# Patient Record
Sex: Male | Born: 1945 | Race: White | Hispanic: No | Marital: Married | Smoking: Current every day smoker
Health system: Southern US, Community
[De-identification: ages and names within clinical notes are randomized; demographics above are authoritative.]

## PROBLEM LIST (undated history)

## (undated) DIAGNOSIS — E119 Type 2 diabetes mellitus without complications: Secondary | ICD-10-CM

## (undated) DIAGNOSIS — I10 Essential (primary) hypertension: Secondary | ICD-10-CM

## (undated) DIAGNOSIS — C801 Malignant (primary) neoplasm, unspecified: Secondary | ICD-10-CM

## (undated) HISTORY — DX: Type 2 diabetes mellitus without complications: E11.9

## (undated) HISTORY — DX: Essential (primary) hypertension: I10

## (undated) HISTORY — DX: Malignant (primary) neoplasm, unspecified: C80.1

---

## 2004-11-25 ENCOUNTER — Inpatient Hospital Stay (HOSPITAL_COMMUNITY): Admission: RE | Admit: 2004-11-25 | Discharge: 2004-11-28 | Payer: Self-pay | Admitting: Urology

## 2004-11-25 ENCOUNTER — Encounter (INDEPENDENT_AMBULATORY_CARE_PROVIDER_SITE_OTHER): Payer: Self-pay | Admitting: Specialist

## 2005-09-30 ENCOUNTER — Encounter (HOSPITAL_COMMUNITY): Admission: RE | Admit: 2005-09-30 | Discharge: 2005-12-07 | Payer: Self-pay | Admitting: Urology

## 2005-10-25 ENCOUNTER — Ambulatory Visit: Admission: RE | Admit: 2005-10-25 | Discharge: 2006-01-16 | Payer: Self-pay | Admitting: Radiation Oncology

## 2008-02-02 ENCOUNTER — Ambulatory Visit: Payer: Self-pay | Admitting: Oncology

## 2008-02-07 ENCOUNTER — Ambulatory Visit (HOSPITAL_COMMUNITY): Admission: RE | Admit: 2008-02-07 | Discharge: 2008-02-07 | Payer: Self-pay | Admitting: Urology

## 2008-02-14 LAB — COMPREHENSIVE METABOLIC PANEL
ALT: 17 U/L (ref 0–53)
Alkaline Phosphatase: 45 U/L (ref 39–117)
CO2: 25 mEq/L (ref 19–32)
Potassium: 4 mEq/L (ref 3.5–5.3)
Sodium: 136 mEq/L (ref 135–145)
Total Bilirubin: 0.5 mg/dL (ref 0.3–1.2)
Total Protein: 6.8 g/dL (ref 6.0–8.3)

## 2008-02-14 LAB — CBC WITH DIFFERENTIAL/PLATELET
BASO%: 0.3 % (ref 0.0–2.0)
MCHC: 35.4 g/dL (ref 32.0–35.9)
MONO#: 0.6 10*3/uL (ref 0.1–0.9)
NEUT#: 4.6 10*3/uL (ref 1.5–6.5)
RBC: 4.15 10*6/uL — ABNORMAL LOW (ref 4.20–5.71)
WBC: 7.3 10*3/uL (ref 4.0–10.0)
lymph#: 2 10*3/uL (ref 0.9–3.3)

## 2011-01-15 NOTE — Discharge Summary (Signed)
NAMEKRISHANG, READING NO.:  [id number]   MEDICAL RECORD NO.:  [id number]          PATIENT TYPE:  INP   LOCATION:  0349                         FACILITY:  [facility]   PHYSICIAN:  Rozanna Boer., M.D.DATE OF BIRTH:  Apr 11, 1946   DATE OF ADMISSION:  11/25/2004  DATE OF DISCHARGE:  11/28/2004                                 DISCHARGE SUMMARY   DISCHARGE DIAGNOSES:  1.  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  2.  Diabetes mellitus type 2 under good control.  3.  Erectile dysfunction.  4.  Elevated PSA.   OPERATION:  Radical retropubic prostatectomy and bilateral pelvic lymph node  dissection on November 25, 2004.   BRIEF HISTORY:  This 65 year old patient is admitted with a clinical T2B  Gleason 4+3 adenocarcinoma of the prostate. His PSA has slowly elevated over  the years. It was 1.01 August 2003, 3.03 June 2004, 3.66 December 2005,  and biopsy in January showed 70% Gleason 4+3 cancer on the right and none on  the left. The patient enters after auto donating 2 units of blood  understanding the risks including but not limited to incontinence,  impotence, deep venous thrombosis, pulmonary emboli, bleeding and death.   MEDICATIONS ON ADMISSION:  Amaryl, hydrochlorothiazide, Pravachol, Altace,  Viagra and metformin.   The only operation before was a circumcision in 1995.   HOSPITAL COURSE:  He was taken to the operating room on the day of admission  after a satisfactory preop evaluation. His hematocrit preoperatively was 35%  and his creatinine was normal at 1.0. At surgery, he had rather extensive  adhesions particularly on the right side posteriorly near the rectum but had  very little blood loss during the case. He did not get his auto donated  blood but the pathology did show that the carcinoma involved the right lobe  and was a Gleason 4+4 with rather extensive involvement of the right lobe  and carcinoma involving the right apex margin of  the prostatectomy. There  was no capsular extension, the nodes were negative, seminal vesicles were  free of tumor. About 20% of the prostate tissue histologically was involved  by cancer. Postoperatively, his hematocrit dropped to 23% but again he  remained stable and did not receive his auto donated blood. His creatinine  was also normal at 1.2. He did well. His drainage slowly decreased and by  the time he was ready to go home on the third postoperative day, the JP  drain was removed and he was on a regular diet and sent home with his Foley  catheter to return to the office in a week for wound staple removal. Sent  home in improved ambulatory condition on a regular diet.      HMK/MEDQ  D:  12/24/2004  T:  12/24/2004  Job:  [id number]

## 2011-01-15 NOTE — H&P (Signed)
Richard [name], Richard [name] NO.:  [id number]   MEDICAL RECORD NO.:  [id number]          PATIENT TYPE:  INP   LOCATION:  0004                         FACILITY:  [facility]   PHYSICIAN:  Richard [name]., M.D.DATE OF BIRTH:  1946/05/15   DATE OF ADMISSION:  11/25/2004  DATE OF DISCHARGE:                                HISTORY & PHYSICAL   HISTORY OF PRESENT ILLNESS:  This 65 year old patient with a clinical T2B,  Gleason 4+3, adenocarcinoma of the prostate enters for radical surgery.  He  had a rise in his PSA.  It was 1.2 in December 2004, went to 3.5 in October  2005, and 3.66 in December 2005.  Biopsy in January showed 70% on the right  side, was Gleason 4+3.  There was none on the left.  After thorough  counseling, the patient chose surgery understanding the risks that were  including but not limited to incontinence, impotence, deep venous  thrombosis, pulmonary emboli, bleeding, and death.  He already has some  impotence for which he uses Viagra.  He already donated two units of blood  and underwent a mechanical bowel prep yesterday.   MEDICATIONS:  1.  Amaryl 25 mg daily.  2.  Hydrochlorothiazide 12.5 mg daily.  3.  Pravachol 40 mg daily.  4.  Altace 5 mg daily.  5.  Metformin 1000 mg daily.  6.  Viagra 50 mg about 50% of the time.   ALLERGIES:  No known drug allergies.   PAST SURGICAL HISTORY:  His only operation was a circumcision in 1995.   REVIEW OF SYSTEMS:  He has had no hematuria, no urinary tract infection, no  irritable bowel, melena.  No peptic ulcer disease.  No cardiac or pulmonary  symptomatology.  Nonsmoker.  No history of kidney stones.   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  His father died  of lung cancer at age 71.  No other family history of prostate cancer,  diabetes, or heart disease.   PHYSICAL EXAMINATION:  VITAL SIGNS:  Weight  207, blood pressure 135/71,  pulse 67, temperature 98, respirations 18.  GENERAL:  He is a healthy white male in no acute distress.  Full gray beard.  HEENT:  Clear.  Oropharynx is benign.  No inguinal, cervical, or axillary  adenopathy.  CHEST:  Clear.  No cardiac murmurs.  ABDOMEN:  Soft, liver and spleen nonpalpable.  Bladder is not distended.  GENITOURINARY:  Circumcised normal penis.  Normal scrotum.  Epididymis and  testes normal size, nontender.  Prostate about 30 g in size, not fixed or  indurated.  SV not palpated.  Slightly more asymmetric on the left side.  EXTREMITIES:  No edema.  Good distal pulses.  Intact sensation to light  touch.   IMPRESSION:  1.  T2B, Gleason 4+3 adenocarcinoma of the prostate.  2.  Elevated prostate-specific antigen.  3.  ED.  4.  Type 2 diabetes.   RECOMMENDATIONS:  Radical retropubic prostatectomy and bilateral pelvic  lymph  node dissection as discussed.      HMK/MEDQ  D:  11/25/2004  T:  11/25/2004  Job:  [id number]   cc:   Richard [name], M.D.  [address], [address]  [place]  [place] [address]  Fax: [phone]

## 2011-01-15 NOTE — Op Note (Signed)
NAMETRACKER, MANCE NO.:  [id number]   MEDICAL RECORD NO.:  [id number]          PATIENT TYPE:  INP   LOCATION:  0349                         FACILITY:  [facility]   PHYSICIAN:  Courtney Paris, M.D.DATE OF BIRTH:  Jan 10, 1946   DATE OF PROCEDURE:  11/25/2004  DATE OF DISCHARGE:  11/28/2004                                 OPERATIVE REPORT   PREOPERATIVE DIAGNOSIS:  Prostate cancer.   POSTOPERATIVE DIAGNOSIS:  Prostate cancer.   PROCEDURE PERFORMED:  Unilateral nerve sparing radical retropubic  prostatectomy with bilateral pelvic lymph node dissection.   ATTENDING SURGEON:  Courtney Paris, M.D.   RESIDENT SURGEON:  Thyra Breed, M.D.   ANESTHESIA:  General endotracheal.   ESTIMATED BLOOD LOSS:  600 cc.   DRAINS:  A 22-French Foley catheter to straight drain and a 10-French Blake  drain to bulb suction.   COMPLICATIONS:  None.   INDICATION FOR PROCEDURE:  Richard [name] is a pleasant 65 year old male  initially evaluated for an elevation of his PSA in 1 year time from 1.2 to  3.66 in December 2005.  A transrectal ultrasound-guided prostate biopsy  performed in January 2006 showed 70% Gleason 4+3=7 adenocarcinoma of the  prostate.  Several alternatives of therapy were discussed with Richard [name]  including watchful waiting, surgical extirpation, high-dose radiation,  brachytherapy, cryotherapy, and hormonal ablation.  Richard [name] has decided  to proceed with a radical retropubic prostatectomy.  He understands all the  risks, benefits, and alternatives of the procedure in detail including, but  not limited to, bleeding, infection, damage to adjacent structures, failure  of the procedure and need for future re-operation, erectile dysfunction,  incontinence, and risks of anesthesia.  Informed consent is obtained.   PROCEDURE IN DETAIL:  Following identification by his arm bracelet, the  patient was brought to the operating room and placed in the supine  position.  A small bump was placed in the patient's lumbar region.  He then underwent  successful induction of general endotracheal anesthesia and was given  preoperative IV antibiotics.  His lower abdomen was then shaved.  His lower  abdomen and genitalia were prepped with Betadine and draped in the usual  sterile fashion.  A 20-French Foley catheter was then inserted into the  bladder and the bladder was drained.   A lower midline incision was then created with a scalpel from the pubis to  just below the umbilicus.  Bovie electrocautery was then used to carry the  incision through the subcutaneous tissue to the level of the anterior rectus  fascia.  Any subcutaneous bleeding was controlled using Bovie  electrocautery.  The fascia was then incised in the midline, isolated.  The  rectus muscle was parted bluntly in the midline exposing the transversalis  fascia.  This was then opened and blunt dissection used to expose both the  right and the left pelvic fossa.  The right iliac fossa was then exposed  with malleable retractors using the Bookwalter self-retaining retractor and  we proceeded with dissection of the right-sided lymph node packet.  This was  initiated over the right iliac vein.  The node packet was rather small and  there was no evidence of gross nodal disease.  The lymph node dissection on  the right was then completed within the limits of dissection including the  external iliac vein, the obturator nerve, the circumflex iliac vein, and the  bifurcation of the iliac artery.  Hem-o-lok clips were then used to control  any vascular and lymphatic channels.  The obturator was kept in plain view  throughout the dissection.  Hemostasis was excellent.  We then reset our  retractor blades and performed an identical procedure on the left-sided  pelvic lymph nodes, again using large Weck Hem-o-lok clips for control of  lymphatic and vascular channels.  There was no evidence of gross  nodal  disease on the left.   We then readjusted our retractors to expose the endopelvic fascia  bilaterally.  This was punctured using Metzenbaum scissors.  A right angle  was then used to elevate the endopelvic fascia from the lateral aspect of  the prostate.  Metzenbaums were used to incise the fascia, thereby dropping  the neurovascular bundles posterolaterally.  The fat overlying the dorsal  venous complex was then teased to narrow the area.  Bovie electrocautery was  used to fulgurate these superficial vessels.  This then allowed excellent  exposure of the puboprostatic ligaments using a Kitner.  Metzenbaum scissors  were then used to gently take down the puboprostatic ligaments at their  extreme lateral attachments to the pubis.  Those in the midline were left  intact.  Surgeon's index finger and thumb were then used to create a groove  just above the urethra but posterior to the dorsal venous complex.  The  McDougal clamp was then passed beneath the dorsal venous complex.  A #1  Vicryl tie was then passed and tied around the dorsal venous complex.  The  McDougal clamp was replaced and a second #1 Vicryl tie was passed to doubly  ligate the complex.  Finally, a 2-0 Vicryl with a needle left intact was  passed, tied, and the dorsal venous complex was suture ligated.  A  Hohenfellner clamp was then placed beneath the dorsal venous complex.  Prior  to this, we did place a 2-0 Vicryl suture on a UR-5 needle in a figure-of-  eight fashion as a back-bleeding stitch.  The Bovie was then used to divide  the dorsal venous complex exposing the apex of the prostate and the urethra.  There was a small area of nodularity once we began developing our plane  between the rectum and prostate at the right apex.  However, the capsule of  the prostate appeared to be intact.  Therefore, we would come back and excise this questionable nodule at the apex after removing the prostate.  Metzenbaum scissors  were then used to carefully dissect the neurovascular  bundles from the urethra laterally on each side.  A right angle was placed  beneath the urethra and a moistened umbilical tape passed to hold the  orientation.  The anterior urethra was then incised using a long handled  knife.  The Foley catheter was then lubricated, pulled through the urethral  defect, the catheter was cut, and the catheter was brought into the wound  and used to provide cephalad traction on the prostate during further  dissection.  The posterior urethra was then divided and all remaining  rectourethralis attachments were taken down bluntly.  The prostate was then  carefully dissected from the rectum bluntly. The right side  of the prostate  was somewhat more difficult to elevate.  Therefore, a right-angle clamp was  placed beneath the right apex of the prostate through our plane we had  created on the left lateral aspect.  This then allowed excellent exposure of  the lateral pedicles.  These were taken down in a successive fashion using  right-angle dissection and large right-angle Hem-o-lok clips.  Once the  prostate had been sufficiently elevated, the anterior leaf of Denonvilliers  fascia was incised over the seminal vesicles.  This allowed easy delivery of  the ampulla of the vas which were dissected out using a right-angle clamp  and ligated using large Hem-o-lok clips.  Metzenbaum scissors were then used  to carefully dissect out the seminal vesicles.  The seminal vesicles were  ligated at their tips after placing a large right-angle Hem-o-lok clip.   We then turned our attention anteriorly where the bladder neck was grasped  between two Allis clamps.  Using Bovie electrocautery and a tonsil clamp,  the bladder neck fibers were carefully dissected out.  A Kitner was then  used until the longitudinal fibers of the urethra were visualized.  We  continued dissecting with our Kitner until the urethra was isolated.   A  right angle clamp was placed beneath the urethra and a moistened umbilical  tape again placed.  The anterior portion of the urethra was divided, the  catheter balloon was deflated, and the catheter removed from the bladder and  left on the prostate.  The remainder of the urethra was then incised using  Metzenbaum dissection.  Using the Bovie and our Metzenbaum scissors, we  carefully continued our dissection of the bladder from the prostate until a  plane was developed between the bladder and the prostate.  The Bovie was  used to remain the intervening tissue.  The bladder neck was widened by a  small hole just beneath our bladder neck.  We would close this in a tennis  racket fashion.  We removed any remaining attachments of the prostate and  the prostatic specimen was transferred for further pathologic analysis.  We  then reconstructed the bladder neck in a tennis racket fashion using a 2-0 chromic suture.  The bladder neck mucosa was everted using several  interrupted 4-0 chromic sutures.  The residual bladder neck was large enough  to admit the tip of the fifth finger of the surgeon.  The wound was then  copiously irrigated and any further bleeding was controlled using Bovie  electrocautery and small Hem-o-lok clips.   At this point, we turned our attention to the nodule of tissue near the  right apex.  We then widely dissected out the neurovascular bundle on this  side and carefully continued our dissection of the nodule off of the  interior mucosa of the rectum.  Extreme care was taken to avoid damage to  the rectum.  The area was carefully visualized and it appeared we were able  to take all of the nodule with no damage to the rectum.  This was sent as a  separate specimen as a right apical margin.  At this point, we placed the  [facility] device into the urethra.  We placed our anastomotic sutures in the  urethral stump with 2-0 Vicryl sutures at the 2, 5, 7, 10, and 12 o'clock   locations.  The Morrison device was removed and a fresh 22-French Foley  catheter was inserted and guided into the bladder.  A Babcock clamp  was used  on the Foley catheter within the bladder to protect the balloon during  placement of the anastomotic sutures.  We then placed our anastomotic  sutures in the bladder in their corresponding locations.  The wound was  again copiously irrigated and there was no further evidence of active  bleeding.  The bladder neck was then securely positioned against the  urethral stump removing all slack from the anastomotic sutures.  The sutures  were then tied and trimmed.  The bladder was irrigated and the anastomosis  was found to be water tight.  The urine was pink.  A separate stab incision  was then made to the left lower aspect of the midline incision where a #10  flat Blake drain was placed and draped in the area of the anastomosis and  right and left pelvic fossa.  The fascia was then closed with a running #1  PDS.  Subcutaneous tissues were irrigated with sterile saline and the skin  was closed using surgical stainless clips.  Foley catheter was again  irrigated and there was no bleeding noted.  The catheter was placed to  straight drainage and the J-P to bulb suction.  All sponge, needle, and  instrument counts were correct x2.  Please note that Dr. Vic Blackbird  was present and participated in the entire procedure as he was the  responsible surgeon.  The patient tolerated the procedure well and there  were no complications.   DISPOSITION:  After awaking from general anesthesia, the patient was  transported to the postanesthesia care unit in stable condition.  From here,  he will be transferred to the floor for further postoperative management.      EG/MEDQ  D:  11/30/2004  T:  11/30/2004  Job:  [id number]

## 2011-08-23 ENCOUNTER — Telehealth: Payer: Self-pay | Admitting: Oncology

## 2011-08-23 NOTE — Telephone Encounter (Signed)
S/w wife today re appt and per wife pt does not want to schedule right now. Per wife it was their understanding that this would be done later down the road if the need arises. But as long as the pt's psa is not rising they don't see the need. Per wife should they decide to schedule they will contact [facility]. Letter sent to [facility].

## 2011-08-30 ENCOUNTER — Telehealth: Payer: Self-pay | Admitting: Oncology

## 2011-08-30 NOTE — Telephone Encounter (Signed)
Pt lmonvm for stating he wishes to go ahead w/shceduling appt w/FS. Returned pt's call and asked that he call me back ASAP re appt w/FS.

## 2011-08-30 NOTE — Telephone Encounter (Signed)
Pt returned my call and was given an appt for 1/9 @ 1:30 pm w/FS.

## 2011-09-07 ENCOUNTER — Other Ambulatory Visit: Payer: Self-pay | Admitting: Oncology

## 2011-09-07 DIAGNOSIS — C61 Malignant neoplasm of prostate: Secondary | ICD-10-CM

## 2011-09-08 ENCOUNTER — Other Ambulatory Visit (HOSPITAL_BASED_OUTPATIENT_CLINIC_OR_DEPARTMENT_OTHER): Payer: BC Managed Care – PPO

## 2011-09-08 ENCOUNTER — Ambulatory Visit: Payer: Self-pay

## 2011-09-08 ENCOUNTER — Telehealth: Payer: Self-pay | Admitting: Oncology

## 2011-09-08 ENCOUNTER — Ambulatory Visit (HOSPITAL_BASED_OUTPATIENT_CLINIC_OR_DEPARTMENT_OTHER): Payer: BC Managed Care – PPO | Admitting: Oncology

## 2011-09-08 VITALS — BP 142/81 | HR 69 | Temp 97.0°F | Ht 70.0 in | Wt 226.4 lb

## 2011-09-08 DIAGNOSIS — C61 Malignant neoplasm of prostate: Secondary | ICD-10-CM

## 2011-09-08 LAB — CBC WITH DIFFERENTIAL/PLATELET
Basophils Absolute: 0 10*3/uL (ref 0.0–0.1)
Eosinophils Absolute: 0.1 10*3/uL (ref 0.0–0.5)
HCT: 38.2 % — ABNORMAL LOW (ref 38.4–49.9)
LYMPH%: 30.8 % (ref 14.0–49.0)
MCV: 94.6 fL (ref 79.3–98.0)
MONO#: 0.6 10*3/uL (ref 0.1–0.9)
MONO%: 7.7 % (ref 0.0–14.0)
NEUT#: 4.7 10*3/uL (ref 1.5–6.5)
NEUT%: 60 % (ref 39.0–75.0)
Platelets: 217 10*3/uL (ref 140–400)
WBC: 7.8 10*3/uL (ref 4.0–10.3)

## 2011-09-08 LAB — COMPREHENSIVE METABOLIC PANEL
AST: 17 U/L (ref 0–37)
Albumin: 4.6 g/dL (ref 3.5–5.2)
Alkaline Phosphatase: 50 U/L (ref 39–117)
BUN: 14 mg/dL (ref 6–23)
Calcium: 9.7 mg/dL (ref 8.4–10.5)
Chloride: 105 mEq/L (ref 96–112)
Glucose, Bld: 63 mg/dL — ABNORMAL LOW (ref 70–99)
Potassium: 4.5 mEq/L (ref 3.5–5.3)
Sodium: 140 mEq/L (ref 135–145)
Total Protein: 6.6 g/dL (ref 6.0–8.3)

## 2011-09-08 NOTE — Progress Notes (Signed)
Note dictated

## 2011-09-08 NOTE — Telephone Encounter (Signed)
gve the pt his April 2013 appt calendar 

## 2011-09-09 NOTE — Progress Notes (Signed)
CC:   Reather Littler, M.D. Natalia Leatherwood, MD  REASON FOR CONSULTATION:  Prostate cancer.  HISTORY OF PRESENT ILLNESS:  Richard [name] is a pleasant gentleman, a native of [place] and currently resides in the [place] area.  He is a gentleman with a past medical history significant for diabetes and hypertension and been diagnosed with prostate cancer dating to 2005.  At that time he had presented with elevated PSA with a rise in PSA of 1.2 up to 3.66 December 2005.  He underwent a biopsy in January of 2006 that showed a Gleason score of 4+3=7 with 70% of the prostate involved.  Subsequently the patient underwent a retropubic prostatectomy and bilateral pelvic node dissection that was done on November 25, 2004.  The pathology, case number [id number], showed a prostatic adenocarcinoma, Gleason score of 4+4=8, involving the right lobe extending into the right apex margin.  Two lymph nodes were biopsied, none of them were involved.  Pathological staging was T2b N0.  He did have a nadir of PSA down to 0.12 and subsequently went up to 0.29.  At that time he was treated with Dr. Dayton Scrape between March and May of 2007 with total of 65 Gy of radiation, a total of 35 fractions.  The patient subsequently had a rise in his PSA, it was up to 1.52.  His imaging studies did not show any evidence of recurrent disease.  At that time I evaluated him for possible systemic therapy and there was really no indication to treat him with any systemic chemotherapy at that time, and he had elected to enroll in a clinical trial that involved being on Lupron for a period of time.  He was treated for about 14 months of Lupron and his PSA dropped to an undetectable level.  Last injection was in October of 2010.  Since that time he has continued a slow rise in his PSA, in June of 2011 was 0.04, in December of 2011 was 0.08, in June of 2012 it was 0.1 and in December of 2007 his PSA was  up to 0.2.  The patient was referred to [place] for evaluation regarding his biochemical relapse.  Clinically Ms. Bott is asymptomatic.  He did not report any abdominal pain.  He did not report any back pain.  He did not report any shoulder pain.  Had not had any really change in his energy level or any constitutional symptoms.  REVIEW OF SYSTEMS:  Does not report any headaches, blurry vision, double vision.  Does not report any motor or sensory neuropathy.  Did not report any alteration in status.  Did not report any psychiatric issues or depression.  Did not report any fever, chills, sweats.  Does not report any cough, hemoptysis, hematemesis.  No nausea or vomiting, no abdominal pain.  Does not report any genitourinary complaints.  Does report erectile dysfunction.  Does not report any bleeding or clotting diathesis.  Rest of the review of systems unremarkable.  PAST MEDICAL HISTORY:  Significant for hyperlipidemia, diabetes, status post prostatectomy.  FAMILY HISTORY:  His father had lung cancer.  His mother had congestive heart failure.  No prostate cancer history.  SOCIAL HISTORY:  He is married.  He has no children.  Denied any alcohol abuse.  He smokes about a pack a day.  Works as a Scientist, product/process development.  CURRENT MEDICATION:  He is on Janumet, ramipril, simvastatin, Actos, amlodipine, fish oil, Glyburide and hydrochlorothiazide  as well as vitamin D supplements.  ALLERGIES:  None.  PHYSICAL EXAMINATION:  Alert, awake gentleman, appeared in no active distress today.  His blood pressure is 142/81, pulse is 69, respiration 20.  He is afebrile.  Head:  Normocephalic, atraumatic.  Pupils equal and round, reactive to light.  Oral mucosa moist and pink.  Neck: Supple.  No lymphadenopathy.  Heart:  Regular rate and rhythm.  S1 and S2.  Lungs:  Clear to auscultation without rhonchi, wheeze or dullness to percussion.  Abdomen:  Soft, nontender.  No  hepatosplenomegaly. Extremities:  No clubbing, cyanosis, edema.  Neurologic:  Intact motor, sensory and deep tendon reflexes.  LABORATORY DATA:  Hemoglobin of 13, white cell count 7.8, platelet count of 217.  ASSESSMENT AND PLAN:  This is a pleasant 66 year old gentleman with prostate cancer.  He had a Gleason score of 4+4 equals 8, PSA then diagnosed at 3.66.  Status post prostatectomy followed by radiation therapy.  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  I had a lengthy discussion today with Mr. Mallick discussing the natural course of prostate cancer in general and more specifically a relapsing, biochemically relapsing with a small rise in his PSA.  I discussed the options of treatment with Mr. Baird today.  These options include watchful waiting, especially given his PSA rise is rather minuscule and appears to be in fractions and once the PSA becomes a whole number, giving him the other option would be androgen deprivation.  I do concur with Dr. Margarita Grizzle that there are risks and benefits associated with this particular approach, but I feel that we can alleviate some of the toxicity associated with androgen deprivation using intermittent strategies.  A 3rd option would be a clinical trial involvement.  I will research any clinical trials in the area that could fit Mr. Moncrieffe's needs.  For the time being, given he is asymptomatic with slow rise in his PSA, I will probably repeat his PSA in about 3 months and if he is indeed has continued to have rise, doubling time with whole numbers 6 months less, then we will institute hormonal deprivation.  He is comfortable with this plan and will follow up in 3 months' time.    ______________________________ Benjiman Core, M.D. FNS/MEDQ  D:  09/08/2011  T:  09/08/2011  Job:  [id number]

## 2011-12-10 ENCOUNTER — Other Ambulatory Visit (HOSPITAL_BASED_OUTPATIENT_CLINIC_OR_DEPARTMENT_OTHER): Payer: BC Managed Care – PPO | Admitting: Lab

## 2011-12-10 DIAGNOSIS — C61 Malignant neoplasm of prostate: Secondary | ICD-10-CM

## 2011-12-10 LAB — CBC WITH DIFFERENTIAL/PLATELET
Eosinophils Absolute: 0.1 10*3/uL (ref 0.0–0.5)
LYMPH%: 30.1 % (ref 14.0–49.0)
MCHC: 33.8 g/dL (ref 32.0–36.0)
MCV: 95.2 fL (ref 79.3–98.0)
MONO%: 9.4 % (ref 0.0–14.0)
NEUT#: 4.5 10*3/uL (ref 1.5–6.5)
Platelets: 202 10*3/uL (ref 140–400)
RBC: 3.85 10*6/uL — ABNORMAL LOW (ref 4.20–5.82)
nRBC: 0 % (ref 0–0)

## 2011-12-13 ENCOUNTER — Other Ambulatory Visit: Payer: Self-pay | Admitting: Lab

## 2011-12-13 LAB — COMPREHENSIVE METABOLIC PANEL
ALT: 13 U/L (ref 0–53)
Alkaline Phosphatase: 47 U/L (ref 39–117)
BUN: 15 mg/dL (ref 6–23)
Calcium: 9.3 mg/dL (ref 8.4–10.5)
Creatinine, Ser: 1.04 mg/dL (ref 0.50–1.35)
Glucose, Bld: 139 mg/dL — ABNORMAL HIGH (ref 70–99)
Potassium: 4.3 mEq/L (ref 3.5–5.3)

## 2011-12-13 LAB — PSA: PSA: 0.4 ng/mL (ref <=4.00)

## 2011-12-15 ENCOUNTER — Ambulatory Visit (HOSPITAL_BASED_OUTPATIENT_CLINIC_OR_DEPARTMENT_OTHER): Payer: BC Managed Care – PPO | Admitting: Oncology

## 2011-12-15 ENCOUNTER — Telehealth: Payer: Self-pay | Admitting: Oncology

## 2011-12-15 VITALS — BP 140/82 | HR 70 | Temp 97.3°F | Ht 70.0 in | Wt 227.5 lb

## 2011-12-15 DIAGNOSIS — C61 Malignant neoplasm of prostate: Secondary | ICD-10-CM

## 2011-12-15 NOTE — Telephone Encounter (Signed)
Gave pt appt calendar for July 2013, lab on 03/15/12 and Md visit after a week.

## 2011-12-15 NOTE — Progress Notes (Signed)
Hematology and Oncology Follow Up Visit  RAYMOUND KATICH [id number] 1946-04-22 66 y.o. 12/15/2011 1:47 PM    Principle Diagnosis: 66 year old gentleman with prostate cancer diagnosed in December of 2005. He had a Gleason score 4+3 = 7 with a PSA of 3.66  Prior Therapy: 1. He is status post retropubic prostatectomy and bilateral lymph node dissection done on 11/25/2004. The pathology showed a Gleason score of 4+4 equals 8 with staging of T2b N0. His PSA nadir was at 0.12. 2. he is status post salvage radiation therapy done between March and May of 2007 for a rise in his PSA to 0.29. He received total of 65 gray in 35 fractions. 3. he subsequently had another rise in his PSA up to 1.52. He was treated with Lupron intermittently last treatment was in October of 2010 with a PSA nadir down to 0.08. Most recent PSA was up to 0.26   Current therapy: Observation and surveillance with intermittent Lupron as needed.  Interim History: Mr. Scully presents today for a followup visit. He is a pleasant 66 year old with the above history. 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 He is currently on intermittent androgen depravation. His PSA had increased from 0.26 up to 0.4 in the last 3 months. Please continue be asymptomatic at this time is not reporting any chest pain or shortness of breath is not reporting any back pain is not reporting any genitourinary complaints. His continued to work full time and perform activities of daily living without any hindrance or decline.  Medications: I have reviewed the patient's current medications. No current outpatient prescriptions on file.  Allergies: No Known Allergies  Past Medical History, Surgical history, Social history, and Family History were reviewed and updated.  Review of Systems: Constitutional:  Negative for fever, chills, night sweats, anorexia, weight loss, pain. Cardiovascular: no chest  pain or dyspnea on exertion Respiratory: no cough, shortness of breath, or wheezing Neurological: negative Dermatological: negative ENT: negative Skin: Negative. Gastrointestinal: negative Genito-Urinary: negative Hematological and Lymphatic: negative Breast: negative Musculoskeletal: negative Remaining ROS negative. Physical Exam: Blood pressure 140/82, pulse 70, temperature 97.3 F (36.3 C), temperature source Oral, height 5\' 10"  (1.778 m), weight 227 lb 8 oz (103.193 kg). ECOG: 0 General appearance: alert Head: Normocephalic, without obvious abnormality, atraumatic Neck: no adenopathy, no carotid bruit, no JVD, supple, symmetrical, trachea midline and thyroid not enlarged, symmetric, no tenderness/mass/nodules Lymph nodes: Cervical, supraclavicular, and axillary nodes normal. Heart:regular rate and rhythm, S1, S2 normal, no murmur, click, rub or gallop Lung:chest clear, no wheezing, rales, normal symmetric air entry Abdomin: soft, non-tender, without masses or organomegaly EXT:no erythema, induration, or nodules   Lab Results: Lab Results  Component Value Date   WBC 7.7 12/10/2011   HGB 12.4* 12/10/2011   HCT 36.6* 12/10/2011   MCV 95.2 12/10/2011   PLT 202 12/10/2011     Chemistry      Component Value Date/Time   NA 137 12/10/2011 1319   K 4.3 12/10/2011 1319   CL 105 12/10/2011 1319   CO2 25 12/10/2011 1319   BUN 15 12/10/2011 1319   CREATININE 1.04 12/10/2011 1319      Component Value Date/Time   CALCIUM 9.3 12/10/2011 1319   ALKPHOS 47 12/10/2011 1319   AST 16 12/10/2011 1319   ALT 13 12/10/2011 1319   BILITOT 0.4 12/10/2011 1319      Impression and Plan: 66 year old gentleman with: 1. Prostate cancer. He had  a Gleason score of 4+4 equals 8, PSA at the time diagnosed at 3.66. 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 Risks and benefits of androgen depravation was rediscussed today and he elected  to the continued observation and institute androgen depravation he is PSA has a more rapid doubling time of less than 3 months. At that time we can restage him with a CT scan of the bone scan and we can institute hormonal deprivation as needed.  2. Followup: In 3 months and a repeat PSA at that time.    Eli Hose, MD 4/17/20131:47 PM

## 2012-03-15 ENCOUNTER — Other Ambulatory Visit (HOSPITAL_BASED_OUTPATIENT_CLINIC_OR_DEPARTMENT_OTHER): Payer: BC Managed Care – PPO

## 2012-03-15 DIAGNOSIS — C61 Malignant neoplasm of prostate: Secondary | ICD-10-CM

## 2012-03-15 LAB — CBC WITH DIFFERENTIAL/PLATELET
BASO%: 1 % (ref 0.0–2.0)
Basophils Absolute: 0.1 10*3/uL (ref 0.0–0.1)
Eosinophils Absolute: 0.1 10*3/uL (ref 0.0–0.5)
HCT: 37.3 % — ABNORMAL LOW (ref 38.4–49.9)
HGB: 12.6 g/dL — ABNORMAL LOW (ref 13.0–17.1)
MCHC: 33.8 g/dL (ref 32.0–36.0)
MONO#: 0.5 10*3/uL (ref 0.1–0.9)
NEUT#: 5.1 10*3/uL (ref 1.5–6.5)
NEUT%: 66 % (ref 39.0–75.0)
WBC: 7.7 10*3/uL (ref 4.0–10.3)
lymph#: 1.9 10*3/uL (ref 0.9–3.3)

## 2012-03-15 LAB — COMPREHENSIVE METABOLIC PANEL
BUN: 12 mg/dL (ref 6–23)
Calcium: 9.4 mg/dL (ref 8.4–10.5)
Chloride: 104 mEq/L (ref 96–112)
Glucose, Bld: 101 mg/dL — ABNORMAL HIGH (ref 70–99)
Potassium: 4.3 mEq/L (ref 3.5–5.3)

## 2012-03-22 ENCOUNTER — Other Ambulatory Visit: Payer: BC Managed Care – PPO | Admitting: Lab

## 2012-03-22 ENCOUNTER — Telehealth: Payer: Self-pay | Admitting: Oncology

## 2012-03-22 ENCOUNTER — Ambulatory Visit (HOSPITAL_BASED_OUTPATIENT_CLINIC_OR_DEPARTMENT_OTHER): Payer: BC Managed Care – PPO | Admitting: Oncology

## 2012-03-22 VITALS — BP 142/74 | HR 88 | Temp 98.2°F | Ht 70.0 in | Wt 224.4 lb

## 2012-03-22 DIAGNOSIS — C61 Malignant neoplasm of prostate: Secondary | ICD-10-CM

## 2012-03-22 NOTE — Telephone Encounter (Signed)
Gave pt appt for October 2013 lab and MD 

## 2012-03-22 NOTE — Progress Notes (Signed)
Hematology and Oncology Follow Up Visit  Richard [name] [id number] August 02, 1946 66 y.o. 03/22/2012 3:51 PM    Principle Diagnosis: 66 year old gentleman with prostate cancer diagnosed in December of 2005. He had a Gleason score 4+3 = 7 with a PSA of 3.66  Prior Therapy: 1. He is status post retropubic prostatectomy and bilateral lymph node dissection done on 11/25/2004. The pathology showed a Gleason score of 4+4 equals 8 with staging of T2b N0. His PSA nadir was at 0.12. 2. he is status post salvage radiation therapy done between March and May of 2007 for a rise in his PSA to 0.29. He received total of 65 gray in 35 fractions. 3. he subsequently had another rise in his PSA up to 1.52. He was treated with Lupron intermittently last treatment was in October of 2010 with a PSA nadir down to 0.08. Most recent PSA was up to 0.26  Current therapy: Observation and surveillance with intermittent Lupron as needed.  Interim History: Mr. Krieger presents today for a followup visit. He is a pleasant 66 year old with the above history. 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 He is currently on intermittent androgen depravation. His PSA had increased from 0.26 up to 0.49 in the last 6 months. Please continue be asymptomatic at this time is not reporting any chest pain or shortness of breath is not reporting any back pain is not reporting any genitourinary complaints. His continued to work full time and perform activities of daily living without any hindrance or decline.  Medications: I have reviewed the patient's current medications.   Allergies: No Known Allergies  Past Medical History, Surgical history, Social history, and Family History were reviewed and updated.  Review of Systems: Constitutional:  Negative for fever, chills, night sweats, anorexia, weight loss, pain. Cardiovascular: no chest pain or dyspnea on exertion Respiratory: no  cough, shortness of breath, or wheezing Neurological: negative Dermatological: negative ENT: negative Skin: Negative. Gastrointestinal: negative Genito-Urinary: negative Hematological and Lymphatic: negative Breast: negative Musculoskeletal: negative Remaining ROS negative. Physical Exam: Blood pressure 142/74, pulse 88, temperature 98.2 F (36.8 C), temperature source Oral, height 5\' 10"  (1.778 m), weight 224 lb 6.4 oz (101.787 kg). ECOG: 0 General appearance: alert Head: Normocephalic, without obvious abnormality, atraumatic Neck: no adenopathy, no carotid bruit, no JVD, supple, symmetrical, trachea midline and thyroid not enlarged, symmetric, no tenderness/mass/nodules Lymph nodes: Cervical, supraclavicular, and axillary nodes normal. Heart:regular rate and rhythm, S1, S2 normal, no murmur, click, rub or gallop Lung:chest clear, no wheezing, rales, normal symmetric air entry Abdomin: soft, non-tender, without masses or organomegaly EXT:no erythema, induration, or nodules   Lab Results: Lab Results  Component Value Date   WBC 7.7 03/15/2012   HGB 12.6* 03/15/2012   HCT 37.3* 03/15/2012   MCV 95.6 03/15/2012   PLT 202 03/15/2012     Chemistry      Component Value Date/Time   NA 138 03/15/2012 1320   K 4.3 03/15/2012 1320   CL 104 03/15/2012 1320   CO2 28 03/15/2012 1320   BUN 12 03/15/2012 1320   CREATININE 1.03 03/15/2012 1320      Component Value Date/Time   CALCIUM 9.4 03/15/2012 1320   ALKPHOS 47 03/15/2012 1320   AST 17 03/15/2012 1320   ALT 15 03/15/2012 1320   BILITOT 0.5 03/15/2012 1320      Impression and Plan: 66 year old gentleman with: 1. Prostate cancer. He had a Gleason score of 4+4 equals  8, PSA at the time diagnosed at 3.66. 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 Risks and benefits of androgen depravation was rediscussed today and he elected to the continued observation and institute  androgen depravation he is PSA has a more rapid doubling time of less than 3 months. At that time we can restage him with a CT scan of the bone scan and we can institute hormonal deprivation as needed.  2. Followup: In 3 months and a repeat PSA at that time.    [facility], MD 7/24/20133:51 PM

## 2012-06-16 ENCOUNTER — Other Ambulatory Visit (HOSPITAL_BASED_OUTPATIENT_CLINIC_OR_DEPARTMENT_OTHER): Payer: BC Managed Care – PPO

## 2012-06-16 DIAGNOSIS — C61 Malignant neoplasm of prostate: Secondary | ICD-10-CM

## 2012-06-16 LAB — COMPREHENSIVE METABOLIC PANEL (CC13)
Albumin: 3.9 g/dL (ref 3.5–5.0)
BUN: 11 mg/dL (ref 7.0–26.0)
CO2: 24 mEq/L (ref 22–29)
Calcium: 9.3 mg/dL (ref 8.4–10.4)
Chloride: 103 mEq/L (ref 98–107)
Creatinine: 0.9 mg/dL (ref 0.7–1.3)
Glucose: 189 mg/dl — ABNORMAL HIGH (ref 70–99)

## 2012-06-16 LAB — CBC WITH DIFFERENTIAL/PLATELET
BASO%: 0.9 % (ref 0.0–2.0)
Eosinophils Absolute: 0.1 10*3/uL (ref 0.0–0.5)
HCT: 38.3 % — ABNORMAL LOW (ref 38.4–49.9)
HGB: 13.1 g/dL (ref 13.0–17.1)
LYMPH%: 31.6 % (ref 14.0–49.0)
MCHC: 34.3 g/dL (ref 32.0–36.0)
MONO#: 0.6 10*3/uL (ref 0.1–0.9)
NEUT#: 4.8 10*3/uL (ref 1.5–6.5)
NEUT%: 58.7 % (ref 39.0–75.0)
Platelets: 208 10*3/uL (ref 140–400)
WBC: 8.2 10*3/uL (ref 4.0–10.3)
lymph#: 2.6 10*3/uL (ref 0.9–3.3)

## 2012-06-22 ENCOUNTER — Ambulatory Visit (HOSPITAL_BASED_OUTPATIENT_CLINIC_OR_DEPARTMENT_OTHER): Payer: BC Managed Care – PPO | Admitting: Oncology

## 2012-06-22 ENCOUNTER — Telehealth: Payer: Self-pay | Admitting: Oncology

## 2012-06-22 VITALS — BP 143/74 | HR 78 | Temp 97.9°F | Resp 20 | Ht 70.0 in | Wt 227.5 lb

## 2012-06-22 DIAGNOSIS — C61 Malignant neoplasm of prostate: Secondary | ICD-10-CM

## 2012-06-22 NOTE — Progress Notes (Signed)
Hematology and Oncology Follow Up Visit  Richard [name] [id number] 04/18/1946 66 y.o. 06/22/2012 3:11 PM    Principle Diagnosis: 66 year old gentleman with prostate cancer diagnosed in December of 2005. He had a Gleason score 4+3 = 7 with a PSA of 3.66  Prior Therapy: 1. He is status post retropubic prostatectomy and bilateral lymph node dissection done on 11/25/2004. The pathology showed a Gleason score of 4+4 equals 8 with staging of T2b N0. His PSA nadir was at 0.12. 2. he is status post salvage radiation therapy done between March and May of 2007 for a rise in his PSA to 0.29. He received total of 65 gray in 35 fractions. 3. he subsequently had another rise in his PSA up to 1.52. He was treated with Lupron intermittently last treatment was in October of 2010 with a PSA nadir down to 0.08. Most recent PSA was up to 0.26  Current therapy: Observation and surveillance with intermittent Lupron as needed.  Interim History: Mr. Richard [name] presents today for a followup visit. He is a pleasant 66 year old with the above history. 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 He is currently on intermittent androgen depravation. His PSA had increased from 0.4 to 0.56 in the last 6 months. He continue be asymptomatic at this time is not reporting any chest pain or shortness of breath is not reporting any back pain is not reporting any genitourinary complaints. His continued to work full time and perform activities of daily living without any hindrance or decline.  Medications: I have reviewed the patient's current medications.   Allergies: No Known Allergies  Past Medical History, Surgical history, Social history, and Family History were reviewed and updated.  Review of Systems: Constitutional:  Negative for fever, chills, night sweats, anorexia, weight loss, pain. Cardiovascular: no chest pain or dyspnea on exertion Respiratory: no cough,  shortness of breath, or wheezing Neurological: negative Dermatological: negative ENT: negative Skin: Negative. Gastrointestinal: negative Genito-Urinary: negative Hematological and Lymphatic: negative Breast: negative Musculoskeletal: negative Remaining ROS negative. Physical Exam: Blood pressure 143/74, pulse 78, temperature 97.9 F (36.6 C), temperature source Oral, resp. rate 20, height 5\' 10"  (1.778 m), weight 227 lb 8 oz (103.193 kg). ECOG: 0 General appearance: alert Head: Normocephalic, without obvious abnormality, atraumatic Neck: no adenopathy, no carotid bruit, no JVD, supple, symmetrical, trachea midline and thyroid not enlarged, symmetric, no tenderness/mass/nodules Lymph nodes: Cervical, supraclavicular, and axillary nodes normal. Heart:regular rate and rhythm, S1, S2 normal, no murmur, click, rub or gallop Lung:chest clear, no wheezing, rales, normal symmetric air entry Abdomin: soft, non-tender, without masses or organomegaly EXT:no erythema, induration, or nodules   Lab Results: Lab Results  Component Value Date   WBC 8.2 06/16/2012   HGB 13.1 06/16/2012   HCT 38.3* 06/16/2012   MCV 96.4 06/16/2012   PLT 208 06/16/2012     Chemistry      Component Value Date/Time   NA 136 06/16/2012 1317   NA 138 03/15/2012 1320   K 4.3 06/16/2012 1317   K 4.3 03/15/2012 1320   CL 103 06/16/2012 1317   CL 104 03/15/2012 1320   CO2 24 06/16/2012 1317   CO2 28 03/15/2012 1320   BUN 11.0 06/16/2012 1317   BUN 12 03/15/2012 1320   CREATININE 0.9 06/16/2012 1317   CREATININE 1.03 03/15/2012 1320      Component Value Date/Time   CALCIUM 9.3 06/16/2012 1317   CALCIUM 9.4 03/15/2012 1320  ALKPHOS 54 06/16/2012 1317   ALKPHOS 47 03/15/2012 1320   AST 15 06/16/2012 1317   AST 17 03/15/2012 1320   ALT 15 06/16/2012 1317   ALT 15 03/15/2012 1320   BILITOT 0.40 06/16/2012 1317   BILITOT 0.5 03/15/2012 1320     Results for CLEBURN, MAIOLO (MRN [id number]) as of 06/22/2012  14:55  Ref. Range 12/10/2011 13:19 03/15/2012 13:20 06/16/2012 13:17  PSA Latest Range: <=4.00 ng/mL 0.40 0.49 0.56   Impression and Plan: 66 year old gentleman with: 1. Prostate cancer. He had a Gleason score of 4+4 equals 8, PSA at the time diagnosed at 3.66. 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 Risks and benefits of androgen depravation was rediscussed today and he elected to the continued observation and institute androgen depravation he is PSA has a more rapid doubling time of less than 3 months. At that time we can restage him with a CT scan of the bone scan and we can institute hormonal deprivation as needed.  2. Followup: In 3 months and a repeat PSA at that time.    Eli Hose, MD 10/24/20133:11 PM

## 2012-06-22 NOTE — Telephone Encounter (Signed)
appts made and printed for pt aom °

## 2012-09-06 ENCOUNTER — Telehealth: Payer: Self-pay | Admitting: Oncology

## 2012-09-06 NOTE — Telephone Encounter (Signed)
returned pt's call regarding needing to r/s due to no ins....was asked to r/s in Feb..Marland KitchenMarland KitchenDone....lvm advising pt of new d/t of appt.

## 2012-09-19 ENCOUNTER — Other Ambulatory Visit: Payer: BC Managed Care – PPO

## 2012-09-21 ENCOUNTER — Ambulatory Visit: Payer: BC Managed Care – PPO | Admitting: Oncology

## 2012-09-21 ENCOUNTER — Other Ambulatory Visit: Payer: BC Managed Care – PPO

## 2012-10-23 ENCOUNTER — Other Ambulatory Visit (HOSPITAL_BASED_OUTPATIENT_CLINIC_OR_DEPARTMENT_OTHER): Payer: PRIVATE HEALTH INSURANCE

## 2012-10-23 DIAGNOSIS — C61 Malignant neoplasm of prostate: Secondary | ICD-10-CM

## 2012-10-23 LAB — COMPREHENSIVE METABOLIC PANEL (CC13)
ALT: 16 U/L (ref 0–55)
AST: 15 U/L (ref 5–34)
Alkaline Phosphatase: 50 U/L (ref 40–150)
BUN: 13.7 mg/dL (ref 7.0–26.0)
Calcium: 9.2 mg/dL (ref 8.4–10.4)
Chloride: 103 mEq/L (ref 98–107)
Creatinine: 0.9 mg/dL (ref 0.7–1.3)
Potassium: 4.2 mEq/L (ref 3.5–5.1)

## 2012-10-23 LAB — CBC WITH DIFFERENTIAL/PLATELET
BASO%: 0.8 % (ref 0.0–2.0)
Basophils Absolute: 0.1 10*3/uL (ref 0.0–0.1)
EOS%: 1.1 % (ref 0.0–7.0)
HGB: 12.8 g/dL — ABNORMAL LOW (ref 13.0–17.1)
MCH: 32.5 pg (ref 27.2–33.4)
MCHC: 34.7 g/dL (ref 32.0–36.0)
MCV: 93.5 fL (ref 79.3–98.0)
MONO%: 6.5 % (ref 0.0–14.0)
NEUT%: 66.7 % (ref 39.0–75.0)
RDW: 13 % (ref 11.0–14.6)
lymph#: 2.2 10*3/uL (ref 0.9–3.3)

## 2012-10-25 ENCOUNTER — Ambulatory Visit (HOSPITAL_BASED_OUTPATIENT_CLINIC_OR_DEPARTMENT_OTHER): Payer: PRIVATE HEALTH INSURANCE | Admitting: Oncology

## 2012-10-25 ENCOUNTER — Telehealth: Payer: Self-pay | Admitting: Oncology

## 2012-10-25 VITALS — BP 136/77 | HR 68 | Temp 97.0°F | Resp 20 | Ht 70.0 in | Wt 226.3 lb

## 2012-10-25 DIAGNOSIS — R972 Elevated prostate specific antigen [PSA]: Secondary | ICD-10-CM

## 2012-10-25 DIAGNOSIS — C61 Malignant neoplasm of prostate: Secondary | ICD-10-CM

## 2012-10-25 DIAGNOSIS — Z8546 Personal history of malignant neoplasm of prostate: Secondary | ICD-10-CM

## 2012-10-25 NOTE — Progress Notes (Signed)
Hematology and Oncology Follow Up Visit  Richard [name] [id number] May 28, 1946 67 y.o. 10/25/2012 4:29 PM    Principle Diagnosis: 67 year old gentleman with prostate cancer diagnosed in December of 2005. He had a Gleason score 4+3 = 7 with a PSA of 3.66  Prior Therapy: 1. He is status post retropubic prostatectomy and bilateral lymph node dissection done on 11/25/2004. The pathology showed a Gleason score of 4+4 equals 8 with staging of T2b N0. His PSA nadir was at 0.12. 2. he is status post salvage radiation therapy done between March and May of 2007 for a rise in his PSA to 0.29. He received total of 65 gray in 35 fractions. 3. he subsequently had another rise in his PSA up to 1.52. He was treated with Lupron intermittently last treatment was in October of 2010 with a PSA nadir down to 0.08. Most recent PSA was up to 0.26  Current therapy: Observation and surveillance with intermittent Lupron as needed.  Interim History: Mr. Asato presents today for a followup visit. He is a pleasant 67 year old with the above history. 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 He is has been on  intermittent androgen depravation. His PSA had increased from 0.4 to 0.55 in the last 6 to 9 months. He continue be asymptomatic at this time is not reporting any chest pain or shortness of breath is not reporting any back pain is not reporting any genitourinary complaints. His continued to work full time and perform activities of daily living without any hindrance or decline.  Medications: I have reviewed the patient's current medications.   Allergies: No Known Allergies  Past Medical History, Surgical history, Social history, and Family History were reviewed and updated.  Review of Systems: Constitutional:  Negative for fever, chills, night sweats, anorexia, weight loss, pain. Cardiovascular: no chest pain or dyspnea on exertion Respiratory: no  cough, shortness of breath, or wheezing Neurological: negative Dermatological: negative ENT: negative Skin: Negative. Gastrointestinal: negative Genito-Urinary: negative Hematological and Lymphatic: negative Breast: negative Musculoskeletal: negative Remaining ROS negative. Physical Exam: Blood pressure 136/77, pulse 68, temperature 97 F (36.1 C), temperature source Oral, resp. rate 20, height 5\' 10"  (1.778 m), weight 226 lb 4.8 oz (102.649 kg). ECOG: 0 General appearance: alert Head: Normocephalic, without obvious abnormality, atraumatic Neck: no adenopathy, no carotid bruit, no JVD, supple, symmetrical, trachea midline and thyroid not enlarged, symmetric, no tenderness/mass/nodules Lymph nodes: Cervical, supraclavicular, and axillary nodes normal. Heart:regular rate and rhythm, S1, S2 normal, no murmur, click, rub or gallop Lung:chest clear, no wheezing, rales, normal symmetric air entry Abdomin: soft, non-tender, without masses or organomegaly EXT:no erythema, induration, or nodules   Lab Results: Lab Results  Component Value Date   WBC 8.7 10/23/2012   HGB 12.8* 10/23/2012   HCT 36.8* 10/23/2012   MCV 93.5 10/23/2012   PLT 208 10/23/2012     Chemistry      Component Value Date/Time   NA 138 10/23/2012 1254   NA 138 03/15/2012 1320   K 4.2 10/23/2012 1254   K 4.3 03/15/2012 1320   CL 103 10/23/2012 1254   CL 104 03/15/2012 1320   CO2 27 10/23/2012 1254   CO2 28 03/15/2012 1320   BUN 13.7 10/23/2012 1254   BUN 12 03/15/2012 1320   CREATININE 0.9 10/23/2012 1254   CREATININE 1.03 03/15/2012 1320      Component Value Date/Time   CALCIUM 9.2 10/23/2012 1254   CALCIUM 9.4  03/15/2012 1320   ALKPHOS 50 10/23/2012 1254   ALKPHOS 47 03/15/2012 1320   AST 15 10/23/2012 1254   AST 17 03/15/2012 1320   ALT 16 10/23/2012 1254   ALT 15 03/15/2012 1320   BILITOT 0.53 10/23/2012 1254   BILITOT 0.5 03/15/2012 1320     Results for HASKELL, RIHN (MRN [id number]) as of 10/25/2012 16:31  Ref.  Range 03/15/2012 13:20 06/16/2012 13:17 10/23/2012 12:54  PSA Latest Range: <=4.00 ng/mL 0.49 0.56 0.55    Impression and Plan: 67 year old gentleman with: 1. Prostate cancer. He had a Gleason score of 4+4 equals 8, PSA at the time diagnosed at 3.66. 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 Risks and benefits of androgen depravation was rediscussed today and he elected to the continued observation and institute androgen depravation he is PSA has a more rapid doubling time of less than 3 months. At that time we can restage him with a CT scan of the bone scan and we can institute hormonal deprivation as needed.  2. Followup: In 4 months and a repeat PSA at that time.    [facility], MD 2/26/20144:29 PM

## 2012-10-25 NOTE — Telephone Encounter (Signed)
, °

## 2013-01-25 ENCOUNTER — Telehealth: Payer: Self-pay | Admitting: Oncology

## 2013-02-20 ENCOUNTER — Other Ambulatory Visit (HOSPITAL_BASED_OUTPATIENT_CLINIC_OR_DEPARTMENT_OTHER): Payer: PRIVATE HEALTH INSURANCE | Admitting: Lab

## 2013-02-20 DIAGNOSIS — C61 Malignant neoplasm of prostate: Secondary | ICD-10-CM

## 2013-02-20 LAB — CBC WITH DIFFERENTIAL/PLATELET
Basophils Absolute: 0.1 10*3/uL (ref 0.0–0.1)
Eosinophils Absolute: 0.1 10*3/uL (ref 0.0–0.5)
HCT: 37.6 % — ABNORMAL LOW (ref 38.4–49.9)
LYMPH%: 24 % (ref 14.0–49.0)
MCV: 95.5 fL (ref 79.3–98.0)
MONO%: 8.1 % (ref 0.0–14.0)
NEUT#: 6 10*3/uL (ref 1.5–6.5)
NEUT%: 66.3 % (ref 39.0–75.0)
Platelets: 221 10*3/uL (ref 140–400)
RBC: 3.94 10*6/uL — ABNORMAL LOW (ref 4.20–5.82)

## 2013-02-20 LAB — COMPREHENSIVE METABOLIC PANEL (CC13)
Alkaline Phosphatase: 54 U/L (ref 40–150)
BUN: 13.3 mg/dL (ref 7.0–26.0)
Creatinine: 1 mg/dL (ref 0.7–1.3)
Glucose: 179 mg/dl — ABNORMAL HIGH (ref 70–99)
Total Bilirubin: 0.41 mg/dL (ref 0.20–1.20)

## 2013-02-20 LAB — PSA: PSA: 0.53 ng/mL (ref <=4.00)

## 2013-02-21 ENCOUNTER — Telehealth: Payer: Self-pay | Admitting: Oncology

## 2013-02-21 ENCOUNTER — Ambulatory Visit (HOSPITAL_BASED_OUTPATIENT_CLINIC_OR_DEPARTMENT_OTHER): Payer: PRIVATE HEALTH INSURANCE | Admitting: Oncology

## 2013-02-21 VITALS — BP 137/66 | HR 61 | Temp 97.0°F | Resp 18 | Ht 70.0 in | Wt 222.5 lb

## 2013-02-21 DIAGNOSIS — C61 Malignant neoplasm of prostate: Secondary | ICD-10-CM

## 2013-02-21 NOTE — Progress Notes (Signed)
Hematology and Oncology Follow Up Visit  Richard [name] [id number] [birth year] 67 y.o. 02/21/2013 8:29 AM    Principle Diagnosis: 67 year old gentleman with prostate cancer diagnosed in December of 2005. He had a Gleason score 4+3 = 7 with a PSA of 3.66  Prior Therapy: 1. He is status post retropubic prostatectomy and bilateral lymph node dissection done on 11/25/2004. The pathology showed a Gleason score of 4+4 equals 8 with staging of T2b N0. His PSA nadir was at 0.12. 2. he is status post salvage radiation therapy done between March and May of 2007 for a rise in his PSA to 0.29. He received total of 65 gray in 35 fractions. 3. he subsequently had another rise in his PSA up to 1.52. He was treated with Lupron intermittently last treatment was in October of 2010 with a PSA nadir down to 0.08. Most recent PSA was up to 0.53.  Current therapy: Observation and surveillance with intermittent Lupron as needed.  Interim History: [name] presents today for a followup visit. He is a pleasant 67 year old with the above history. 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 He is has been on  intermittent androgen depravation. His PSA had increased from 0.4 to 0.55 in the last 6 to 9 months. He continue be asymptomatic at this time is not reporting any chest pain or shortness of breath is not reporting any back pain is not reporting any genitourinary complaints. His continued to work full time and perform activities of daily living without any hindrance or decline.  Medications: I have reviewed the patient's current medications.   Allergies: No Known Allergies  Past Medical History, Surgical history, Social history, and Family History were reviewed and updated.  Review of Systems: Constitutional:  Negative for fever, chills, night sweats, anorexia, weight loss, pain. Cardiovascular: no chest pain or dyspnea on exertion Respiratory: no  cough, shortness of breath, or wheezing Neurological: negative Dermatological: negative ENT: negative Skin: Negative. Gastrointestinal: negative Genito-Urinary: negative Hematological and Lymphatic: negative Breast: negative Musculoskeletal: negative Remaining ROS negative. Physical Exam: Blood pressure 137/66, pulse 61, temperature 97 F (36.1 C), temperature source Oral, resp. rate 18, height 5\' 10"  (1.778 m), weight 222 lb 8 oz (100.925 kg). ECOG: 0 General appearance: alert Head: Normocephalic, without obvious abnormality, atraumatic Neck: no adenopathy, no carotid bruit, no JVD, supple, symmetrical, trachea midline and thyroid not enlarged, symmetric, no tenderness/mass/nodules Lymph nodes: Cervical, supraclavicular, and axillary nodes normal. Heart:regular rate and rhythm, S1, S2 normal, no murmur, click, rub or gallop Lung:chest clear, no wheezing, rales, normal symmetric air entry Abdomin: soft, non-tender, without masses or organomegaly EXT:no erythema, induration, or nodules   Lab Results: Lab Results  Component Value Date   WBC 9.1 02/20/2013   HGB 12.9* 02/20/2013   HCT 37.6* 02/20/2013   MCV 95.5 02/20/2013   PLT 221 02/20/2013     Chemistry      Component Value Date/Time   NA 138 02/20/2013 1254   NA 138 03/15/2012 1320   K 4.4 02/20/2013 1254   K 4.3 03/15/2012 1320   CL 102 02/20/2013 1254   CL 104 03/15/2012 1320   CO2 25 02/20/2013 1254   CO2 28 03/15/2012 1320   BUN 13.3 02/20/2013 1254   BUN 12 03/15/2012 1320   CREATININE 1.0 02/20/2013 1254   CREATININE 1.03 03/15/2012 1320      Component Value Date/Time   CALCIUM 9.3 02/20/2013 1254   CALCIUM 9.4  03/15/2012 1320   ALKPHOS 54 02/20/2013 1254   ALKPHOS 47 03/15/2012 1320   AST 15 02/20/2013 1254   AST 17 03/15/2012 1320   ALT 14 02/20/2013 1254   ALT 15 03/15/2012 1320   BILITOT 0.41 02/20/2013 1254   BILITOT 0.5 03/15/2012 1320     Results for Richard, [name] (MRN [id number]) as of 02/21/2013 08:29  Ref.  Range 10/23/2012 12:54 02/20/2013 12:54  PSA Latest Range: <=4.00 ng/mL 0.55 0.53     Impression and Plan: 67 year old gentleman with: 1. Prostate cancer. He had a Gleason score of 4+4 equals 8, PSA at the time diagnosed at 3.66. 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 Risks and benefits of androgen depravation was rediscussed today and he elected to the continued observation and institute androgen depravation he is PSA has a more rapid doubling time of less than 3 months. At that time we can restage him with a CT scan of the bone scan and we can institute hormonal deprivation as needed.  2. Followup: In 4 months and a repeat PSA at that time.    [facility], MD 6/25/20148:29 AM

## 2013-02-21 NOTE — Telephone Encounter (Signed)
gv and printed appt sched and avs for pt  °

## 2013-02-22 ENCOUNTER — Ambulatory Visit: Payer: PRIVATE HEALTH INSURANCE | Admitting: Oncology

## 2013-06-14 ENCOUNTER — Encounter: Payer: Self-pay | Admitting: Endocrinology

## 2013-06-26 ENCOUNTER — Encounter (INDEPENDENT_AMBULATORY_CARE_PROVIDER_SITE_OTHER): Payer: Self-pay

## 2013-06-26 ENCOUNTER — Other Ambulatory Visit (HOSPITAL_BASED_OUTPATIENT_CLINIC_OR_DEPARTMENT_OTHER): Payer: PRIVATE HEALTH INSURANCE

## 2013-06-26 DIAGNOSIS — C61 Malignant neoplasm of prostate: Secondary | ICD-10-CM

## 2013-06-26 LAB — COMPREHENSIVE METABOLIC PANEL (CC13)
ALT: 14 U/L (ref 0–55)
Anion Gap: 10 mEq/L (ref 3–11)
CO2: 24 mEq/L (ref 22–29)
Calcium: 9.1 mg/dL (ref 8.4–10.4)
Chloride: 107 mEq/L (ref 98–109)
Creatinine: 0.9 mg/dL (ref 0.7–1.3)
Glucose: 114 mg/dl (ref 70–140)
Sodium: 140 mEq/L (ref 136–145)
Total Bilirubin: 0.45 mg/dL (ref 0.20–1.20)
Total Protein: 6.5 g/dL (ref 6.4–8.3)

## 2013-06-26 LAB — CBC WITH DIFFERENTIAL/PLATELET
Eosinophils Absolute: 0.1 10*3/uL (ref 0.0–0.5)
HCT: 38.4 % (ref 38.4–49.9)
LYMPH%: 29.6 % (ref 14.0–49.0)
MONO#: 0.6 10*3/uL (ref 0.1–0.9)
NEUT#: 3.9 10*3/uL (ref 1.5–6.5)
NEUT%: 59 % (ref 39.0–75.0)
Platelets: 207 10*3/uL (ref 140–400)
WBC: 6.5 10*3/uL (ref 4.0–10.3)
lymph#: 1.9 10*3/uL (ref 0.9–3.3)

## 2013-06-26 LAB — PSA: PSA: 0.6 ng/mL (ref <=4.00)

## 2013-06-28 ENCOUNTER — Telehealth: Payer: Self-pay | Admitting: Oncology

## 2013-06-28 ENCOUNTER — Ambulatory Visit (HOSPITAL_BASED_OUTPATIENT_CLINIC_OR_DEPARTMENT_OTHER): Payer: PRIVATE HEALTH INSURANCE | Admitting: Oncology

## 2013-06-28 VITALS — BP 143/75 | HR 74 | Temp 97.7°F | Resp 18 | Ht 70.0 in | Wt 220.0 lb

## 2013-06-28 DIAGNOSIS — C61 Malignant neoplasm of prostate: Secondary | ICD-10-CM

## 2013-06-28 NOTE — Telephone Encounter (Signed)
gv and printed appt sched and avs for pt for Feb 2015 °

## 2013-06-28 NOTE — Progress Notes (Signed)
Hematology and Oncology Follow Up Visit  Richard [name] [id number] 06/14/1946 67 y.o. 06/28/2013 1:32 PM    Principle Diagnosis: 67 year old gentleman with prostate cancer diagnosed in December of 2005. He had a Gleason score 4+3 = 7 with a PSA of 3.66  Prior Therapy: 1. He is status post retropubic prostatectomy and bilateral lymph node dissection done on 11/25/2004. The pathology showed a Gleason score of 4+4 equals 8 with staging of T2b N0. His PSA nadir was at 0.12. 2. he is status post salvage radiation therapy done between March and May of 2007 for a rise in his PSA to 0.29. He received total of 65 gray in 35 fractions. 3. he subsequently had another rise in his PSA up to 1.52. He was treated with Lupron intermittently last treatment was in October of 2010 with a PSA nadir down to 0.08. Most recent PSA was up to 0.53.  Current therapy: Observation and surveillance with intermittent Lupron as needed.  Interim History: Mr. Prows presents today for a followup visit. He is a pleasant 67 year old with the above history. 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 He is has been on  intermittent androgen depravation. He continue be asymptomatic at this time is not reporting any chest pain or shortness of breath is not reporting any back pain is not reporting any genitourinary complaints. His continued to work full time and perform activities of daily living without any hindrance or decline. Is not reporting any constitutional symptoms or musculoskeletal complaints.  Medications: I have reviewed the patient's current medications.   Allergies: No Known Allergies  Past Medical History, Surgical history, Social history, and Family History were reviewed and updated.  Review of Systems: Remaining ROS negative. Physical Exam: Blood pressure 143/75, pulse 74, temperature 97.7 F (36.5 C), temperature source Oral, resp. rate 18,  height 5\' 10"  (1.778 m), weight 220 lb (99.791 kg), SpO2 98.00%. ECOG: 0 General appearance: alert Head: Normocephalic, without obvious abnormality, atraumatic Neck: no adenopathy, no carotid bruit, no JVD, supple, symmetrical, trachea midline and thyroid not enlarged, symmetric, no tenderness/mass/nodules Lymph nodes: Cervical, supraclavicular, and axillary nodes normal. Heart:regular rate and rhythm, S1, S2 normal, no murmur, click, rub or gallop Lung:chest clear, no wheezing, rales, normal symmetric air entry Abdomin: soft, non-tender, without masses or organomegaly EXT:no erythema, induration, or nodules   Lab Results: Lab Results  Component Value Date   WBC 6.5 06/26/2013   HGB 12.9* 06/26/2013   HCT 38.4 06/26/2013   MCV 94.5 06/26/2013   PLT 207 06/26/2013     Chemistry      Component Value Date/Time   NA 140 06/26/2013 0815   NA 138 03/15/2012 1320   K 4.6 06/26/2013 0815   K 4.3 03/15/2012 1320   CL 102 02/20/2013 1254   CL 104 03/15/2012 1320   CO2 24 06/26/2013 0815   CO2 28 03/15/2012 1320   BUN 12.9 06/26/2013 0815   BUN 12 03/15/2012 1320   CREATININE 0.9 06/26/2013 0815   CREATININE 1.03 03/15/2012 1320      Component Value Date/Time   CALCIUM 9.1 06/26/2013 0815   CALCIUM 9.4 03/15/2012 1320   ALKPHOS 47 06/26/2013 0815   ALKPHOS 47 03/15/2012 1320   AST 15 06/26/2013 0815   AST 17 03/15/2012 1320   ALT 14 06/26/2013 0815   ALT 15 03/15/2012 1320   BILITOT 0.45 06/26/2013 0815   BILITOT 0.5 03/15/2012 1320  Results for BOEN, STERBENZ (MRN [id number]) as of 06/28/2013 13:26  Ref. Range 10/23/2012 12:54 02/20/2013 12:54 06/26/2013 08:16  PSA Latest Range: <=4.00 ng/mL 0.55 0.53 0.60     Impression and Plan: 67 year old gentleman with: 1. Prostate cancer. He had a Gleason score of 4+4 equals 8, PSA at the time diagnosed at 3.66. He is currently has biochemically relapsing disease with a very marginal change in his PSA is up from 0.49 to 0.6. Risks and  benefits of androgen depravation was rediscussed today and he elected to the continued observation and institute androgen depravation he is PSA has a more rapid doubling time of less than 3 months. At that time we can restage him with a CT scan of the bone scan and we can institute hormonal deprivation as needed.  2. Followup: In 4 months and a repeat PSA at that time.    Eli Hose, MD 10/30/20141:32 PM

## 2013-07-09 ENCOUNTER — Other Ambulatory Visit: Payer: Self-pay | Admitting: *Deleted

## 2013-07-09 MED ORDER — PIOGLITAZONE HCL 30 MG PO TABS: 30.0000 mg | ORAL_TABLET | Freq: Every day | ORAL | Status: DC

## 2013-07-09 MED ORDER — GLIMEPIRIDE 2 MG PO TABS: 2.0000 mg | ORAL_TABLET | Freq: Every day | ORAL | Status: DC

## 2013-07-09 MED ORDER — RAMIPRIL 10 MG PO CAPS: 10.0000 mg | ORAL_CAPSULE | Freq: Every day | ORAL | Status: DC

## 2013-07-09 MED ORDER — AMLODIPINE BESYLATE 5 MG PO TABS: 5.0000 mg | ORAL_TABLET | Freq: Every day | ORAL | Status: DC

## 2013-07-17 ENCOUNTER — Other Ambulatory Visit: Payer: PRIVATE HEALTH INSURANCE

## 2013-07-19 ENCOUNTER — Ambulatory Visit: Payer: PRIVATE HEALTH INSURANCE | Admitting: Endocrinology

## 2013-08-03 ENCOUNTER — Encounter: Payer: Self-pay | Admitting: Endocrinology

## 2013-08-07 ENCOUNTER — Other Ambulatory Visit: Payer: PRIVATE HEALTH INSURANCE

## 2013-08-07 ENCOUNTER — Other Ambulatory Visit (INDEPENDENT_AMBULATORY_CARE_PROVIDER_SITE_OTHER): Payer: PRIVATE HEALTH INSURANCE

## 2013-08-07 ENCOUNTER — Other Ambulatory Visit: Payer: Self-pay | Admitting: *Deleted

## 2013-08-07 DIAGNOSIS — E118 Type 2 diabetes mellitus with unspecified complications: Secondary | ICD-10-CM | POA: Insufficient documentation

## 2013-08-07 DIAGNOSIS — E119 Type 2 diabetes mellitus without complications: Secondary | ICD-10-CM

## 2013-08-07 LAB — COMPREHENSIVE METABOLIC PANEL
Alkaline Phosphatase: 47 U/L (ref 39–117)
BUN: 12 mg/dL (ref 6–23)
Calcium: 8.7 mg/dL (ref 8.4–10.5)
Chloride: 98 mEq/L (ref 96–112)
Glucose, Bld: 174 mg/dL — ABNORMAL HIGH (ref 70–99)
Sodium: 132 mEq/L — ABNORMAL LOW (ref 135–145)
Total Bilirubin: 0.5 mg/dL (ref 0.3–1.2)

## 2013-08-07 LAB — LIPID PANEL
Cholesterol: 134 mg/dL (ref 0–200)
HDL: 49.7 mg/dL (ref >39.00)
LDL Cholesterol: 51 mg/dL (ref 0–99)
Triglycerides: 167 mg/dL — ABNORMAL HIGH (ref 0.0–149.0)
VLDL: 33.4 mg/dL (ref 0.0–40.0)

## 2013-08-10 ENCOUNTER — Ambulatory Visit (INDEPENDENT_AMBULATORY_CARE_PROVIDER_SITE_OTHER): Payer: PRIVATE HEALTH INSURANCE | Admitting: Endocrinology

## 2013-08-10 ENCOUNTER — Encounter: Payer: Self-pay | Admitting: Endocrinology

## 2013-08-10 VITALS — BP 124/60 | HR 66 | Temp 98.1°F | Resp 12 | Ht 70.5 in | Wt 219.4 lb

## 2013-08-10 DIAGNOSIS — E785 Hyperlipidemia, unspecified: Secondary | ICD-10-CM

## 2013-08-10 DIAGNOSIS — I10 Essential (primary) hypertension: Secondary | ICD-10-CM

## 2013-08-10 DIAGNOSIS — E871 Hypo-osmolality and hyponatremia: Secondary | ICD-10-CM

## 2013-08-10 DIAGNOSIS — E119 Type 2 diabetes mellitus without complications: Secondary | ICD-10-CM

## 2013-08-10 NOTE — Patient Instructions (Signed)
Please check blood sugars at least half the time about 2 hours after any meal and as directed on waking up.   Please bring blood sugar monitor to each visit  

## 2013-08-10 NOTE — Progress Notes (Signed)
Patient ID: Richard [name], male   DOB: 28-Dec-1945, 67 y.o.   MRN: [id number]   Reason for Appointment: Diabetes follow-up   History of Present Illness   Diagnosis: Type 2 DIABETES MELITUS, date of diagnosis 1995 with a glucose of 320 and HemoglobinA1c of 9.6 Previous history: He has been on various oral hypoglycemic drugs in the past but for the last few years has required multiple drugs in combination; diabetes has been generally well controlled with A1c near normal  Recent history: His blood sugars are again consistently well controlled with upper normal A1c. Not clear how often he is monitoring his glucose at home and did not bring his monitor. He is taking a 4 drug regimen including low dose Amaryl twice a day without any hypoglycemia Although he has maintained his weight he has not been exercising as much recently His glucose was 174 in the lab which is after lunch, he is not checking after lunch regularly at home     Oral hypoglycemic drugs: Amaryl 2 mg twice a day, Janumet 50/1000 twice a day, Actos 15 mg daily Side effects from medications: None Proper timing of medications in relation to meals: Yes.          Monitors blood glucose: ? 3 times a week     Glucometer: One Touch.          Blood Glucose readings from  <145 : readings before breakfast:  Hypoglycemia frequency:  none         Meals: 3 meals per day.          Physical activity: exercise: Some walking, not as regular recently           Dietician visit: Most recent:? 2007   Weight control:  Wt Readings from Last 3 Encounters:  08/10/13 219 lb 6.4 oz (99.519 kg)  06/28/13 220 lb (99.791 kg)  02/21/13 222 lb 8 oz (100.925 kg)       Complications:     Diabetes labs:  Lab Results  Component Value Date   HGBA1C 6.3 08/07/2013   Lab Results  Component Value Date   LDLCALC 51 08/07/2013   CREATININE 1.2 08/07/2013       Medication List       This list is accurate as of: 08/10/13 11:59 PM.  Always use your most  recent med list.               amLODipine 5 MG tablet  Commonly known as:  NORVASC  Take 1 tablet (5 mg total) by mouth daily.     glimepiride 2 MG tablet  Commonly known as:  AMARYL  Take 1 tablet (2 mg total) by mouth daily with breakfast.     JANUMET 50-1000 MG per tablet  Generic drug:  sitaGLIPtin-metformin  Take by mouth Twice daily.     pioglitazone 30 MG tablet  Commonly known as:  ACTOS  Take 1 tablet (30 mg total) by mouth daily.     ramipril 10 MG capsule  Commonly known as:  ALTACE  Take 1 capsule (10 mg total) by mouth daily.     simvastatin 40 MG tablet  Commonly known as:  ZOCOR  Take 40 mg by mouth Every PM.        Allergies: No Known Allergies  Past Medical History  Diagnosis Date  . Cancer     Prostate    No past surgical history on file.  Family History  Problem Relation Age of Onset  .  Hypertension Father     Social History:  reports that he has been smoking.  He has never used smokeless tobacco. His alcohol and drug histories are not on file.  Review of Systems:  Hypertension:  blood pressure has been very well controlled with 10 mg ramipril. Does not monitor at home regularly  Lipids:  he has been compliant with his simvastatin 40 mg LDL at goal and nonfasting triglycerides near-normal  History of vitamin B12 deficiency     LABS:  Appointment on 08/07/2013  Component Date Value Range Status  . Cholesterol 08/07/2013 134  0 - 200 mg/dL Final   ATP III Classification       Desirable:  < 200 mg/dL               Borderline High:  200 - 239 mg/dL          High:  > = 960 mg/dL  . Triglycerides 08/07/2013 167.0* 0.0 - 149.0 mg/dL Final   Normal:  <454 mg/dLBorderline High:  150 - 199 mg/dL  . HDL 08/07/2013 49.70  >39.00 mg/dL Final  . VLDL 09/81/1914 33.4  0.0 - 40.0 mg/dL Final  . LDL Cholesterol 08/07/2013 51  0 - 99 mg/dL Final  . Total CHOL/HDL Ratio 08/07/2013 3   Final                  Men          Women1/2 Average Risk      3.4          3.3Average Risk          5.0          4.42X Average Risk          9.6          7.13X Average Risk          15.0          11.0                      . Hemoglobin A1C 08/07/2013 6.3  4.6 - 6.5 % Final   Glycemic Control Guidelines for People with Diabetes:Non Diabetic:  <6%Goal of Therapy: <7%Additional Action Suggested:  >8%   . Sodium 08/07/2013 132* 135 - 145 mEq/L Final  . Potassium 08/07/2013 4.1  3.5 - 5.1 mEq/L Final  . Chloride 08/07/2013 98  96 - 112 mEq/L Final  . CO2 08/07/2013 25  19 - 32 mEq/L Final  . Glucose, Bld 08/07/2013 174* 70 - 99 mg/dL Final  . BUN 78/29/5621 12  6 - 23 mg/dL Final  . Creatinine, Ser 08/07/2013 1.2  0.4 - 1.5 mg/dL Final  . Total Bilirubin 08/07/2013 0.5  0.3 - 1.2 mg/dL Final  . Alkaline Phosphatase 08/07/2013 47  39 - 117 U/L Final  . AST 08/07/2013 17  0 - 37 U/L Final  . ALT 08/07/2013 14  0 - 53 U/L Final  . Total Protein 08/07/2013 6.7  6.0 - 8.3 g/dL Final  . Albumin 30/86/5784 4.0  3.5 - 5.2 g/dL Final  . Calcium 69/62/9528 8.7  8.4 - 10.5 mg/dL Final  . GFR 41/32/4401 61.52  >60.00 mL/min Final     Examination:   BP 124/60  Pulse 66  Temp(Src) 98.1 F (36.7 C)  Resp 12  Ht 5' 10.5" (1.791 m)  Wt 219 lb 6.4 oz (99.519 kg)  BMI 31.03 kg/m2  SpO2 97%  Body mass index is 31.03 kg/(m^2).  ASSESSMENT/ PLAN::    Diabetes type 2:  Blood glucose control appears overall fairly good with upper normal A1c At home his blood sugars are well controlled although glucose was higher in the office with a restaurant lunch. Encouraged him to check more readings when eating out and also bring monitor for review To exercise regularly  Hypertension: Well controlled  Hypercholesterolemia: excellent control, to continue Zocor   [name] 08/12/2013, 3:14 PM

## 2013-08-12 ENCOUNTER — Encounter: Payer: Self-pay | Admitting: Endocrinology

## 2013-10-17 ENCOUNTER — Other Ambulatory Visit (HOSPITAL_BASED_OUTPATIENT_CLINIC_OR_DEPARTMENT_OTHER): Payer: PRIVATE HEALTH INSURANCE

## 2013-10-17 DIAGNOSIS — C61 Malignant neoplasm of prostate: Secondary | ICD-10-CM

## 2013-10-17 LAB — CBC WITH DIFFERENTIAL/PLATELET
BASO%: 0.8 % (ref 0.0–2.0)
BASOS ABS: 0.1 10*3/uL (ref 0.0–0.1)
EOS%: 1.1 % (ref 0.0–7.0)
Eosinophils Absolute: 0.1 10*3/uL (ref 0.0–0.5)
HCT: 38.9 % (ref 38.4–49.9)
HGB: 13.2 g/dL (ref 13.0–17.1)
LYMPH#: 2.5 10*3/uL (ref 0.9–3.3)
LYMPH%: 29.5 % (ref 14.0–49.0)
MCH: 32.4 pg (ref 27.2–33.4)
MCHC: 33.9 g/dL (ref 32.0–36.0)
MCV: 95.7 fL (ref 79.3–98.0)
MONO#: 0.7 10*3/uL (ref 0.1–0.9)
MONO%: 8.2 % (ref 0.0–14.0)
NEUT#: 5 10*3/uL (ref 1.5–6.5)
NEUT%: 60.4 % (ref 39.0–75.0)
Platelets: 222 10*3/uL (ref 140–400)
RBC: 4.07 10*6/uL — AB (ref 4.20–5.82)
RDW: 13 % (ref 11.0–14.6)
WBC: 8.3 10*3/uL (ref 4.0–10.3)

## 2013-10-17 LAB — COMPREHENSIVE METABOLIC PANEL (CC13)
ALT: 16 U/L (ref 0–55)
ANION GAP: 9 meq/L (ref 3–11)
AST: 17 U/L (ref 5–34)
Albumin: 4.1 g/dL (ref 3.5–5.0)
Alkaline Phosphatase: 49 U/L (ref 40–150)
BUN: 13.2 mg/dL (ref 7.0–26.0)
CALCIUM: 9.6 mg/dL (ref 8.4–10.4)
CHLORIDE: 105 meq/L (ref 98–109)
CO2: 26 meq/L (ref 22–29)
CREATININE: 0.8 mg/dL (ref 0.7–1.3)
Glucose: 137 mg/dl (ref 70–140)
Potassium: 4.4 mEq/L (ref 3.5–5.1)
Sodium: 140 mEq/L (ref 136–145)
Total Bilirubin: 0.46 mg/dL (ref 0.20–1.20)
Total Protein: 6.7 g/dL (ref 6.4–8.3)

## 2013-10-17 LAB — PSA: PSA: 0.81 ng/mL (ref <=4.00)

## 2013-10-19 ENCOUNTER — Encounter (INDEPENDENT_AMBULATORY_CARE_PROVIDER_SITE_OTHER): Payer: Self-pay

## 2013-10-19 ENCOUNTER — Telehealth: Payer: Self-pay | Admitting: Oncology

## 2013-10-19 ENCOUNTER — Ambulatory Visit (HOSPITAL_BASED_OUTPATIENT_CLINIC_OR_DEPARTMENT_OTHER): Payer: PRIVATE HEALTH INSURANCE | Admitting: Oncology

## 2013-10-19 VITALS — BP 153/74 | HR 70 | Temp 97.0°F | Resp 18 | Ht 70.5 in | Wt 221.7 lb

## 2013-10-19 DIAGNOSIS — C61 Malignant neoplasm of prostate: Secondary | ICD-10-CM

## 2013-10-19 NOTE — Telephone Encounter (Signed)
gv and printed appt sched and avs for pt for June... °

## 2013-10-19 NOTE — Progress Notes (Signed)
Hematology and Oncology Follow Up Visit  Richard [name] [id number] 03/25/1946 68 y.o. 10/19/2013 3:50 PM    Principle Diagnosis: 68 year old gentleman with prostate cancer diagnosed in December of 2005. He had a Gleason score 4+3 = 7 with a PSA of 3.66  Prior Therapy: 1. He is status post retropubic prostatectomy and bilateral lymph node dissection done on 11/25/2004. The pathology showed a Gleason score of 4+4 equals 8 with staging of T2b N0. His PSA nadir was at 0.12. 2. he is status post salvage radiation therapy done between March and May of 2007 for a rise in his PSA to 0.29. He received total of 65 gray in 35 fractions. 3. he subsequently had another rise in his PSA up to 1.52. He was treated with Lupron intermittently last treatment was in October of 2010 with a PSA nadir down to 0.08. Most recent PSA was up to 0.53.  Current therapy: Observation and surveillance with intermittent Lupron as needed.  Interim History: Richard [name] presents today for a followup visit. He is a pleasant 68 year old with the above history. 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 He is has been on  intermittent androgen depravation. He continue be asymptomatic at this time is not reporting any chest pain or shortness of breath is not reporting any back pain is not reporting any genitourinary complaints. His continued to work full time and perform activities of daily living without any hindrance or decline. Is not reporting any constitutional symptoms or musculoskeletal complaints. He has not reported any recent illnesses or hospitalizations.  Medications: I have reviewed the patient's current medications.   Allergies: No Known Allergies  Past Medical History, Surgical history, Social history, and Family History were reviewed and updated.  Review of Systems: Remaining ROS negative. Physical Exam: Blood pressure 153/74, pulse 70, temperature 97  F (36.1 C), temperature source Oral, resp. rate 18, height 5' 10.5" (1.791 m), weight 221 lb 11.2 oz (100.562 kg). ECOG: 0 General appearance: alert Head: Normocephalic, without obvious abnormality, atraumatic Neck: no adenopathy, no carotid bruit, no JVD, supple, symmetrical, trachea midline and thyroid not enlarged, symmetric, no tenderness/mass/nodules Lymph nodes: Cervical, supraclavicular, and axillary nodes normal. Heart:regular rate and rhythm, S1, S2 normal, no murmur, click, rub or gallop Lung:chest clear, no wheezing, rales, normal symmetric air entry Abdomin: soft, non-tender, without masses or organomegaly EXT:no erythema, induration, or nodules   Lab Results: Lab Results  Component Value Date   WBC 8.3 10/17/2013   HGB 13.2 10/17/2013   HCT 38.9 10/17/2013   MCV 95.7 10/17/2013   PLT 222 10/17/2013     Chemistry      Component Value Date/Time   NA 140 10/17/2013 1307   NA 132* 08/07/2013 1257   K 4.4 10/17/2013 1307   K 4.1 08/07/2013 1257   CL 98 08/07/2013 1257   CL 102 02/20/2013 1254   CO2 26 10/17/2013 1307   CO2 25 08/07/2013 1257   BUN 13.2 10/17/2013 1307   BUN 12 08/07/2013 1257   CREATININE 0.8 10/17/2013 1307   CREATININE 1.2 08/07/2013 1257      Component Value Date/Time   CALCIUM 9.6 10/17/2013 1307   CALCIUM 8.7 08/07/2013 1257   ALKPHOS 49 10/17/2013 1307   ALKPHOS 47 08/07/2013 1257   AST 17 10/17/2013 1307   AST 17 08/07/2013 1257   ALT 16 10/17/2013 1307   ALT 14 08/07/2013 1257   BILITOT 0.46 10/17/2013 1307  BILITOT 0.5 08/07/2013 1257      Results for Richard [name] (MRN [id number]) as of 10/19/2013 15:00  Ref. Range 02/20/2013 12:54 06/26/2013 08:16 10/17/2013 13:08  PSA Latest Range: <=4.00 ng/mL 0.53 0.60 0.81      Impression and Plan: 68 year old gentleman with: 1. Prostate cancer. He had a Gleason score of 4+4 equals 8, PSA at the time diagnosed at 3.66. He is currently has biochemically relapsing disease with a very marginal change in his PSA  is up from 0.49 to 0.81. Risks and benefits of androgen depravation was rediscussed today and he elected to the continued observation and institute androgen depravation he is PSA has a more rapid doubling time of less than 3 months. At that time we can restage him with a CT scan of the bone scan and we can institute hormonal deprivation as needed.  2. Followup: In 4 months and a repeat PSA at that time.    Zola Button, MD 2/20/20153:50 PM

## 2013-12-10 ENCOUNTER — Other Ambulatory Visit (INDEPENDENT_AMBULATORY_CARE_PROVIDER_SITE_OTHER): Payer: PRIVATE HEALTH INSURANCE

## 2013-12-10 DIAGNOSIS — E119 Type 2 diabetes mellitus without complications: Secondary | ICD-10-CM

## 2013-12-10 LAB — MICROALBUMIN / CREATININE URINE RATIO
CREATININE, U: 59.7 mg/dL
MICROALB UR: 7.6 mg/dL — AB (ref 0.0–1.9)
MICROALB/CREAT RATIO: 12.7 mg/g (ref 0.0–30.0)

## 2013-12-10 LAB — BASIC METABOLIC PANEL
BUN: 15 mg/dL (ref 6–23)
CALCIUM: 9.1 mg/dL (ref 8.4–10.5)
CO2: 27 mEq/L (ref 19–32)
Chloride: 104 mEq/L (ref 96–112)
Creatinine, Ser: 0.8 mg/dL (ref 0.4–1.5)
GFR: 97.67 mL/min (ref >60.00)
Glucose, Bld: 102 mg/dL — ABNORMAL HIGH (ref 70–99)
Potassium: 4.1 mEq/L (ref 3.5–5.1)
Sodium: 138 mEq/L (ref 135–145)

## 2013-12-10 LAB — HEMOGLOBIN A1C: HEMOGLOBIN A1C: 6.3 % (ref 4.6–6.5)

## 2013-12-12 ENCOUNTER — Ambulatory Visit (INDEPENDENT_AMBULATORY_CARE_PROVIDER_SITE_OTHER): Payer: PRIVATE HEALTH INSURANCE | Admitting: Endocrinology

## 2013-12-12 ENCOUNTER — Encounter: Payer: Self-pay | Admitting: Endocrinology

## 2013-12-12 VITALS — BP 158/74 | HR 78 | Temp 98.3°F | Resp 16 | Ht 70.5 in | Wt 217.0 lb

## 2013-12-12 DIAGNOSIS — E538 Deficiency of other specified B group vitamins: Secondary | ICD-10-CM

## 2013-12-12 DIAGNOSIS — E785 Hyperlipidemia, unspecified: Secondary | ICD-10-CM

## 2013-12-12 DIAGNOSIS — I10 Essential (primary) hypertension: Secondary | ICD-10-CM

## 2013-12-12 DIAGNOSIS — E119 Type 2 diabetes mellitus without complications: Secondary | ICD-10-CM

## 2013-12-12 DIAGNOSIS — Z8546 Personal history of malignant neoplasm of prostate: Secondary | ICD-10-CM

## 2013-12-12 LAB — GLUCOSE, POCT (MANUAL RESULT ENTRY): POC GLUCOSE: 141 mg/dL — AB (ref 70–99)

## 2013-12-12 NOTE — Progress Notes (Signed)
Patient ID: Richard [name], male   DOB: 09-13-1945, 68 y.o.   MRN: [id number]   Reason for Appointment: Diabetes follow-up   History of Present Illness   Diagnosis: Type 2 DIABETES MELITUS, date of diagnosis 1995 with a glucose of 320 and HemoglobinA1c of 9.6 Previous history: He has been on various oral hypoglycemic drugs in the past but for the last few years has required multiple drugs in combination; diabetes has been generally well controlled with A1c near normal  Recent history: His blood sugars are again  well controlled with upper normal A1c.  He tends to have slightly high readings in the mornings Not clear how often he is monitoring his glucose at home as he again did not bring his monitor. He is taking a 4 drug regimen including low dose Amaryl once a day without any hypoglycemia Although he has lost some weight he has not been exercising as much; however tends to be walking at work     Oral hypoglycemic drugs: Amaryl 2 mg once a day, Janumet 50/1000 twice a day, Actos 15 mg daily Side effects from medications: None Proper timing of medications in relation to meals: Yes.          Monitors blood glucose: ? 3 times a week     Glucometer: One Touch.          Blood Glucose readings: pcl once 170  readings before breakfast: 120-140 usually  Hypoglycemia frequency:  none         Meals: 3 meals per day.          Physical activity: exercise: Some walking, not as regular recently           Dietician visit: Most recent:? 2007   Weight control:  Wt Readings from Last 3 Encounters:  12/12/13 217 lb (98.431 kg)  10/19/13 221 lb 11.2 oz (100.562 kg)  08/10/13 219 lb 6.4 oz (84.696 kg)       Complications:   none   Diabetes labs:  Lab Results  Component Value Date   HGBA1C 6.3 12/10/2013   HGBA1C 6.3 08/07/2013   Lab Results  Component Value Date   MICROALBUR 7.6* 12/10/2013   LDLCALC 51 08/07/2013   CREATININE 0.8 12/10/2013       Medication List       This list is  accurate as of: 12/12/13  8:05 AM.  Always use your most recent med list.               amLODipine 5 MG tablet  Commonly known as:  NORVASC  Take 1 tablet (5 mg total) by mouth daily.     glimepiride 2 MG tablet  Commonly known as:  AMARYL  Take 1 tablet (2 mg total) by mouth daily with breakfast.     JANUMET 50-1000 MG per tablet  Generic drug:  sitaGLIPtin-metformin  Take by mouth Twice daily.     pioglitazone 30 MG tablet  Commonly known as:  ACTOS  Take 1 tablet (30 mg total) by mouth daily.     ramipril 10 MG capsule  Commonly known as:  ALTACE  Take 1 capsule (10 mg total) by mouth daily.     simvastatin 40 MG tablet  Commonly known as:  ZOCOR  Take 40 mg by mouth Every PM.        Allergies: No Known Allergies  Past Medical History  Diagnosis Date  . Cancer     Prostate    No past surgical  history on file.  Family History  Problem Relation Age of Onset  . Hypertension Father     Social History:  reports that he has been smoking.  He has never used smokeless tobacco. His alcohol and drug histories are not on file.  Review of Systems:  Hypertension:  blood pressure has been very well controlled with 10 mg ramipril. Does not monitor at home   Lipids:  he has been compliant with his simvastatin 40 mg LDL at goal and nonfasting triglycerides near-normal  Lab Results  Component Value Date   CHOL 134 08/07/2013   HDL 49.70 08/07/2013   LDLCALC 51 08/07/2013   TRIG 167.0* 08/07/2013   CHOLHDL 3 08/07/2013    History of vitamin B12 deficiency     LABS:  Office Visit on 12/12/2013  Component Date Value Ref Range Status  . POC Glucose 12/12/2013 141* 70 - 99 mg/dl Final  Appointment on 12/10/2013  Component Date Value Ref Range Status  . Hemoglobin A1C 12/10/2013 6.3  4.6 - 6.5 % Final   Glycemic Control Guidelines for People with Diabetes:Non Diabetic:  <6%Goal of Therapy: <7%Additional Action Suggested:  >8%   . Sodium 12/10/2013 138  135 - 145 mEq/L  Final  . Potassium 12/10/2013 4.1  3.5 - 5.1 mEq/L Final  . Chloride 12/10/2013 104  96 - 112 mEq/L Final  . CO2 12/10/2013 27  19 - 32 mEq/L Final  . Glucose, Bld 12/10/2013 102* 70 - 99 mg/dL Final  . BUN 12/10/2013 15  6 - 23 mg/dL Final  . Creatinine, Ser 12/10/2013 0.8  0.4 - 1.5 mg/dL Final  . Calcium 12/10/2013 9.1  8.4 - 10.5 mg/dL Final  . GFR 12/10/2013 97.67  >60.00 mL/min Final  . Microalb, Ur 12/10/2013 7.6* 0.0 - 1.9 mg/dL Final  . Creatinine,U 12/10/2013 59.7   Final  . Microalb Creat Ratio 12/10/2013 12.7  0.0 - 30.0 mg/g Final     Examination:   BP 158/74  Pulse 78  Temp(Src) 98.3 F (36.8 C)  Resp 16  Ht 5' 10.5" (1.791 m)  Wt 217 lb (98.431 kg)  BMI 30.69 kg/m2  SpO2 95%  Body mass index is 30.69 kg/(m^2).   Repeat with large cuff 132/72  No ankle edema  ASSESSMENT/ PLAN:    Diabetes type 2:  Blood glucose control appears overall  good with upper normal A1c At home his blood sugars are well controlled although glucose tends to be higher fasting Encouraged him to check more readings after meals especially when eating out and also bring monitor for review To exercise with brisk walking more regularly No change in medications  Hypertension: Well controlled  Hypercholesterolemia: Well controlled on simvastatin  To have complete physical exam on next visit  Elayne Snare 12/12/2013, 8:05 AM

## 2013-12-14 ENCOUNTER — Other Ambulatory Visit: Payer: Self-pay | Admitting: *Deleted

## 2013-12-14 ENCOUNTER — Telehealth: Payer: Self-pay | Admitting: Endocrinology

## 2013-12-14 MED ORDER — SITAGLIPTIN PHOS-METFORMIN HCL 50-1000 MG PO TABS: 1.0000 | ORAL_TABLET | Freq: Two times a day (BID) | ORAL | Status: DC

## 2013-12-14 NOTE — Telephone Encounter (Signed)
rx sent

## 2013-12-14 NOTE — Telephone Encounter (Signed)
Please call in script for janumet asap pt has 2 pills left

## 2014-01-15 ENCOUNTER — Other Ambulatory Visit: Payer: Self-pay | Admitting: *Deleted

## 2014-01-15 MED ORDER — SIMVASTATIN 40 MG PO TABS: 40.0000 mg | ORAL_TABLET | Freq: Every day | ORAL | Status: DC

## 2014-01-15 MED ORDER — RAMIPRIL 10 MG PO CAPS: 10.0000 mg | ORAL_CAPSULE | Freq: Every day | ORAL | Status: DC

## 2014-01-15 MED ORDER — GLIMEPIRIDE 2 MG PO TABS: 2.0000 mg | ORAL_TABLET | Freq: Every day | ORAL | Status: DC

## 2014-01-15 MED ORDER — PIOGLITAZONE HCL 30 MG PO TABS: 30.0000 mg | ORAL_TABLET | Freq: Every day | ORAL | Status: DC

## 2014-01-15 MED ORDER — AMLODIPINE BESYLATE 5 MG PO TABS: 5.0000 mg | ORAL_TABLET | Freq: Every day | ORAL | Status: DC

## 2014-02-19 ENCOUNTER — Other Ambulatory Visit (HOSPITAL_BASED_OUTPATIENT_CLINIC_OR_DEPARTMENT_OTHER): Payer: PRIVATE HEALTH INSURANCE

## 2014-02-19 DIAGNOSIS — C61 Malignant neoplasm of prostate: Secondary | ICD-10-CM

## 2014-02-19 LAB — CBC WITH DIFFERENTIAL/PLATELET
BASO%: 1 % (ref 0.0–2.0)
BASOS ABS: 0.1 10*3/uL (ref 0.0–0.1)
EOS%: 1.1 % (ref 0.0–7.0)
Eosinophils Absolute: 0.1 10*3/uL (ref 0.0–0.5)
HCT: 38.3 % — ABNORMAL LOW (ref 38.4–49.9)
HEMOGLOBIN: 13 g/dL (ref 13.0–17.1)
LYMPH#: 1.5 10*3/uL (ref 0.9–3.3)
LYMPH%: 22.5 % (ref 14.0–49.0)
MCH: 32.5 pg (ref 27.2–33.4)
MCHC: 34 g/dL (ref 32.0–36.0)
MCV: 95.7 fL (ref 79.3–98.0)
MONO#: 0.6 10*3/uL (ref 0.1–0.9)
MONO%: 9.6 % (ref 0.0–14.0)
NEUT#: 4.4 10*3/uL (ref 1.5–6.5)
NEUT%: 65.8 % (ref 39.0–75.0)
Platelets: 215 10*3/uL (ref 140–400)
RBC: 4.01 10*6/uL — ABNORMAL LOW (ref 4.20–5.82)
RDW: 13 % (ref 11.0–14.6)
WBC: 6.7 10*3/uL (ref 4.0–10.3)

## 2014-02-19 LAB — COMPREHENSIVE METABOLIC PANEL (CC13)
ALT: 15 U/L (ref 0–55)
AST: 17 U/L (ref 5–34)
Albumin: 4 g/dL (ref 3.5–5.0)
Alkaline Phosphatase: 50 U/L (ref 40–150)
Anion Gap: 8 mEq/L (ref 3–11)
BUN: 12.6 mg/dL (ref 7.0–26.0)
CO2: 27 mEq/L (ref 22–29)
Calcium: 9.2 mg/dL (ref 8.4–10.4)
Chloride: 104 mEq/L (ref 98–109)
Creatinine: 0.9 mg/dL (ref 0.7–1.3)
Glucose: 123 mg/dl (ref 70–140)
POTASSIUM: 4.5 meq/L (ref 3.5–5.1)
Sodium: 138 mEq/L (ref 136–145)
Total Bilirubin: 0.44 mg/dL (ref 0.20–1.20)
Total Protein: 6.7 g/dL (ref 6.4–8.3)

## 2014-02-20 LAB — PSA: PSA: 0.76 ng/mL (ref <=4.00)

## 2014-02-21 ENCOUNTER — Encounter: Payer: Self-pay | Admitting: Oncology

## 2014-02-21 ENCOUNTER — Ambulatory Visit (HOSPITAL_BASED_OUTPATIENT_CLINIC_OR_DEPARTMENT_OTHER): Payer: PRIVATE HEALTH INSURANCE | Admitting: Oncology

## 2014-02-21 ENCOUNTER — Telehealth: Payer: Self-pay | Admitting: Oncology

## 2014-02-21 VITALS — BP 141/61 | HR 60 | Temp 97.5°F | Resp 18 | Ht 70.5 in | Wt 220.0 lb

## 2014-02-21 DIAGNOSIS — Z8546 Personal history of malignant neoplasm of prostate: Secondary | ICD-10-CM

## 2014-02-21 DIAGNOSIS — C61 Malignant neoplasm of prostate: Secondary | ICD-10-CM

## 2014-02-21 NOTE — Telephone Encounter (Signed)
gv and printed appt sched and avs for pt for OCT. °

## 2014-02-21 NOTE — Progress Notes (Signed)
Hematology and Oncology Follow Up Visit  Richard [name] [id number] Oct 28, 1945 68 y.o. 02/21/2014 10:14 AM    Principle Diagnosis: 68 year old gentleman with prostate cancer diagnosed in December of 2005. He had a Gleason score 4+3 = 7 with a PSA of 3.66  Prior Therapy: 1. He is status post retropubic prostatectomy and bilateral lymph node dissection done on 11/25/2004. The pathology showed a Gleason score of 4+4 equals 8 with staging of T2b N0. His PSA nadir was at 0.12. 2. he is status post salvage radiation therapy done between March and May of 2007 for a rise in his PSA to 0.29. He received total of 65 gray in 35 fractions. 3. he subsequently had another rise in his PSA up to 1.52. He was treated with Lupron intermittently last treatment was in October of 2010 with a PSA nadir down to 0.08. Most recent PSA was up to 0.53.  Current therapy: Observation and surveillance with intermittent Lupron as needed.  Interim History: Richard [name] presents today for a followup visit. Since his last visit he has been doing well He continue be asymptomatic at this time is not reporting any chest pain or shortness of breath is not reporting any back pain is not reporting any genitourinary complaints. His continued to work full time and perform activities of daily living without any hindrance or decline. Is not reporting any constitutional symptoms or musculoskeletal complaints. He has not reported any recent illnesses or hospitalizations. He has not reported any headaches or blurred vision or double vision. Does not report any fevers or chills or sweats. Does not report any chest pain or shortness of breath. As that report any nausea or vomiting. Did not report any frequency urgency or hesitancy. Rest of his review of systems unremarkable.  Medications: I have reviewed the patient's current medications.   Allergies: No Known Allergies  Past Medical History, Surgical history, Social history, and Family  History were reviewed and updated.  Marland Kitchen Physical Exam: Blood pressure 141/61, pulse 60, temperature 97.5 F (36.4 C), temperature source Oral, resp. rate 18, height 5' 10.5" (1.791 m), weight 220 lb (99.791 kg), SpO2 99.00%. ECOG: 0 General appearance: alert awake not in any distress to Head: Normocephalic, without obvious abnormality, atraumatic Neck: no adenopathy, no carotid bruit, no JVD, supple, symmetrical, trachea midline and thyroid not enlarged, symmetric, no tenderness/mass/nodules Lymph nodes: Cervical, supraclavicular, and axillary nodes normal. Heart:regular rate and rhythm, S1, S2 normal, no murmur, click, rub or gallop Lung:chest clear, no wheezing, rales, normal symmetric air entry Abdomin: soft, non-tender, without masses or organomegaly EXT:no erythema, induration, or nodules   Lab Results: Lab Results  Component Value Date   WBC 6.7 02/19/2014   HGB 13.0 02/19/2014   HCT 38.3* 02/19/2014   MCV 95.7 02/19/2014   PLT 215 02/19/2014     Chemistry      Component Value Date/Time   NA 138 02/19/2014 0815   NA 138 12/10/2013 0832   K 4.5 02/19/2014 0815   K 4.1 12/10/2013 0832   CL 104 12/10/2013 0832   CL 102 02/20/2013 1254   CO2 27 02/19/2014 0815   CO2 27 12/10/2013 0832   BUN 12.6 02/19/2014 0815   BUN 15 12/10/2013 0832   CREATININE 0.9 02/19/2014 0815   CREATININE 0.8 12/10/2013 0832      Component Value Date/Time   CALCIUM 9.2 02/19/2014 0815   CALCIUM 9.1 12/10/2013 0832   ALKPHOS 50 02/19/2014 0815   ALKPHOS 47 08/07/2013 1257   AST 17  02/19/2014 0815   AST 17 08/07/2013 1257   ALT 15 02/19/2014 0815   ALT 14 08/07/2013 1257   BILITOT 0.44 02/19/2014 0815   BILITOT 0.5 08/07/2013 1257      Results for SENDER, RUEB (MRN [id number]) as of 02/21/2014 10:01  Ref. Range 10/17/2013 13:08 02/19/2014 08:15  PSA Latest Range: <=4.00 ng/mL 0.81 0.76       Impression and Plan: 68 year old gentleman with:   1. Prostate cancer. He had a Gleason score of 4+4 equals 8,  PSA at the time diagnosed at 3.66. He is currently has biochemically relapsing disease with a very marginal change in his PSA is up from 0.49 to 0.81 and then down to 0.76. Risks and benefits of androgen depravation was rediscussed today and he elected to the continued observation and institute androgen depravation he is PSA has a more rapid doubling time of less than 6 months. At that time we can restage him with a CT scan of the bone scan and we can institute hormonal deprivation as needed.  2. Followup: In 4 months and a repeat PSA at that time.    [facility], MD 6/25/201510:14 AM

## 2014-04-09 ENCOUNTER — Other Ambulatory Visit (INDEPENDENT_AMBULATORY_CARE_PROVIDER_SITE_OTHER): Payer: PRIVATE HEALTH INSURANCE

## 2014-04-09 DIAGNOSIS — E538 Deficiency of other specified B group vitamins: Secondary | ICD-10-CM

## 2014-04-09 DIAGNOSIS — E785 Hyperlipidemia, unspecified: Secondary | ICD-10-CM

## 2014-04-09 DIAGNOSIS — E119 Type 2 diabetes mellitus without complications: Secondary | ICD-10-CM

## 2014-04-09 LAB — COMPREHENSIVE METABOLIC PANEL
ALBUMIN: 4.1 g/dL (ref 3.5–5.2)
ALT: 15 U/L (ref 0–53)
AST: 20 U/L (ref 0–37)
Alkaline Phosphatase: 46 U/L (ref 39–117)
BILIRUBIN TOTAL: 0.6 mg/dL (ref 0.2–1.2)
BUN: 11 mg/dL (ref 6–23)
CO2: 25 mEq/L (ref 19–32)
Calcium: 9 mg/dL (ref 8.4–10.5)
Chloride: 103 mEq/L (ref 96–112)
Creatinine, Ser: 1 mg/dL (ref 0.4–1.5)
GFR: 82.49 mL/min (ref >60.00)
GLUCOSE: 109 mg/dL — AB (ref 70–99)
POTASSIUM: 4.6 meq/L (ref 3.5–5.1)
Sodium: 136 mEq/L (ref 135–145)
Total Protein: 7.1 g/dL (ref 6.0–8.3)

## 2014-04-09 LAB — CBC WITH DIFFERENTIAL/PLATELET
BASOS PCT: 0.4 % (ref 0.0–3.0)
Basophils Absolute: 0 10*3/uL (ref 0.0–0.1)
EOS ABS: 0.1 10*3/uL (ref 0.0–0.7)
Eosinophils Relative: 1.1 % (ref 0.0–5.0)
HCT: 39.3 % (ref 39.0–52.0)
Hemoglobin: 13.3 g/dL (ref 13.0–17.0)
Lymphocytes Relative: 22.5 % (ref 12.0–46.0)
Lymphs Abs: 1.6 10*3/uL (ref 0.7–4.0)
MCHC: 33.7 g/dL (ref 30.0–36.0)
MCV: 95.8 fl (ref 78.0–100.0)
MONO ABS: 0.7 10*3/uL (ref 0.1–1.0)
Monocytes Relative: 10.5 % (ref 3.0–12.0)
NEUTROS ABS: 4.6 10*3/uL (ref 1.4–7.7)
NEUTROS PCT: 65.5 % (ref 43.0–77.0)
Platelets: 226 10*3/uL (ref 150.0–400.0)
RBC: 4.11 Mil/uL — AB (ref 4.22–5.81)
RDW: 13.7 % (ref 11.5–15.5)
WBC: 7 10*3/uL (ref 4.0–10.5)

## 2014-04-09 LAB — LIPID PANEL
CHOLESTEROL: 151 mg/dL (ref 0–200)
HDL: 49.4 mg/dL (ref >39.00)
LDL Cholesterol: 75 mg/dL (ref 0–99)
NonHDL: 101.6
TRIGLYCERIDES: 132 mg/dL (ref 0.0–149.0)
Total CHOL/HDL Ratio: 3
VLDL: 26.4 mg/dL (ref 0.0–40.0)

## 2014-04-09 LAB — HEMOGLOBIN A1C: Hgb A1c MFr Bld: 6.3 % (ref 4.6–6.5)

## 2014-04-12 ENCOUNTER — Ambulatory Visit (INDEPENDENT_AMBULATORY_CARE_PROVIDER_SITE_OTHER): Payer: PRIVATE HEALTH INSURANCE | Admitting: Endocrinology

## 2014-04-12 ENCOUNTER — Encounter: Payer: Self-pay | Admitting: Endocrinology

## 2014-04-12 VITALS — BP 138/75 | HR 83 | Temp 98.0°F | Resp 16 | Ht 70.5 in | Wt 217.2 lb

## 2014-04-12 DIAGNOSIS — E785 Hyperlipidemia, unspecified: Secondary | ICD-10-CM

## 2014-04-12 DIAGNOSIS — N529 Male erectile dysfunction, unspecified: Secondary | ICD-10-CM | POA: Diagnosis not present

## 2014-04-12 DIAGNOSIS — E119 Type 2 diabetes mellitus without complications: Secondary | ICD-10-CM

## 2014-04-12 DIAGNOSIS — F329 Major depressive disorder, single episode, unspecified: Secondary | ICD-10-CM

## 2014-04-12 DIAGNOSIS — I10 Essential (primary) hypertension: Secondary | ICD-10-CM

## 2014-04-12 DIAGNOSIS — F32A Depression, unspecified: Secondary | ICD-10-CM

## 2014-04-12 DIAGNOSIS — Z0000 Encounter for general adult medical examination without abnormal findings: Secondary | ICD-10-CM | POA: Diagnosis not present

## 2014-04-12 DIAGNOSIS — Z23 Encounter for immunization: Secondary | ICD-10-CM | POA: Diagnosis not present

## 2014-04-12 DIAGNOSIS — E538 Deficiency of other specified B group vitamins: Secondary | ICD-10-CM

## 2014-04-12 DIAGNOSIS — F3289 Other specified depressive episodes: Secondary | ICD-10-CM

## 2014-04-12 LAB — GLUCOSE, POCT (MANUAL RESULT ENTRY): POC Glucose: 211 mg/dl — AB (ref 70–99)

## 2014-04-12 MED ORDER — VENLAFAXINE HCL ER 75 MG PO CP24: 75.0000 mg | ORAL_CAPSULE | Freq: Every day | ORAL | Status: DC

## 2014-04-12 NOTE — Progress Notes (Signed)
Patient ID: Richard [name], male   DOB: Jan 12, 1946, 68 y.o.   MRN: [id number]   Reason for Appointment:  Anxiety and general physical exam  History of Present Illness   Problem 1: For the last week or 2 he has been feeling more wired up and anxious. Also he tends to be irritable and his wife says that he tends to get aggressive and angry at times. Also is having some depression with mood swings. He is also not able to focus clearly and he thinks he has more attention deficit. Has no difficulty sleeping. His wife thinks that several years ago he had PTSD but has never been on medications. However in the past when he had insomnia he had taken Ambien he apparently got suicidal  Problem 2: Preventive care  He has normal balance and no history of falls  Nutrition needs no further adjustment, already eating a heart healthy diet  Lipid screening done  Colorectal screening needs to be done with stool Hemoccult, he is reluctant to do colonoscopy Regular eye and dental exams to be continued  Preventive 81 mg aspirin not indicated  No difficulties with vision or hearing Depression screening: See above  Exercise: He is walking and needs to do this more regularly  Labs up to date   EKG done today Followup with oncologist for prostate cancer regularly  Vaccines:  Zostavax / Pneumococcal Vaccine/ influenza up-to-date  Does need Prevnar and DTaP, discussed and will do  Problem 3: DIABETES MELITUS, date of diagnosis 1995 with a glucose of 320 and HemoglobinA1c of 9.6 Previous history: He has been on various oral hypoglycemic drugs in the past but for the last few years has required multiple drugs in combination; diabetes has been generally well controlled with A1c near normal  Recent history: His blood sugars are again  well controlled with a consistent A1c of 6.3 He tends to have slightly high readings in the mornings Glucose in the office is high today but he drank lemonade Not clear how  often he is monitoring his glucose at home as he again did not bring his monitor. He is taking a 4 drug regimen including low dose Amaryl once a day without any hypoglycemia He has been trying to walk regularly, has not lost any weight recently     Oral hypoglycemic drugs: Amaryl 2 mg once a day, Janumet 50/1000 twice a day, Actos 15 mg daily Side effects from medications: None Proper timing of medications in relation to meals: Yes.          Monitors blood glucose: ? 3 times a week     Glucometer: One Touch.          Blood Glucose readings:   readings before breakfast: 120-130 usually and after lunch 140-150  Hypoglycemia:  none         Meals: 3 meals per day, usually watching portions.          Physical activity: exercise:  walking, relatively irregular recently           Dietician visit: Most recent:? 2007   Weight control:  Wt Readings from Last 3 Encounters:  04/12/14 217 lb 3.2 oz (98.521 kg)  02/21/14 220 lb (99.791 kg)  12/12/13 217 lb (56.812 kg)       Complications:   none   Diabetes labs:  Lab Results  Component Value Date   HGBA1C 6.3 04/09/2014   HGBA1C 6.3 12/10/2013   HGBA1C 6.3 08/07/2013   Lab Results  Component Value Date   MICROALBUR 7.6* 12/10/2013   LDLCALC 75 04/09/2014   CREATININE 1.0 04/09/2014       Medication List       This list is accurate as of: 04/12/14  8:03 AM.  Always use your most recent med list.               amLODipine 5 MG tablet  Commonly known as:  NORVASC  Take 1 tablet (5 mg total) by mouth daily.     glimepiride 2 MG tablet  Commonly known as:  AMARYL  Take 1 tablet (2 mg total) by mouth daily with breakfast.     pioglitazone 30 MG tablet  Commonly known as:  ACTOS  Take 1 tablet (30 mg total) by mouth daily.     ramipril 10 MG capsule  Commonly known as:  ALTACE  Take 1 capsule (10 mg total) by mouth daily.     simvastatin 40 MG tablet  Commonly known as:  ZOCOR  Take 1 tablet (40 mg total) by mouth daily.      sitaGLIPtin-metformin 50-1000 MG per tablet  Commonly known as:  JANUMET  Take 1 tablet by mouth 2 (two) times daily with a meal.        Allergies: No Known Allergies  Past Medical History  Diagnosis Date  . Cancer     Prostate    No past surgical history on file.  Family History  Problem Relation Age of Onset  . Hypertension Father     Social History:  reports that he has been smoking.  He has never used smokeless tobacco. His alcohol and drug histories are not on file.    REVIEW OF SYSTEMS:       Eye exam last  9/14. Has history of cataract surgery  Hypertension:  blood pressure has been very well controlled with 10 mg ramipril, amlodipine 5 mg. Does not monitor at home   Lipids:  he has been compliant with his simvastatin 40 mg LDL at goal and HDL/triglycerides are normal  Lab Results  Component Value Date   CHOL 151 04/09/2014   HDL 49.40 04/09/2014   LDLCALC 75 04/09/2014   TRIG 132.0 04/09/2014   CHOLHDL 3 04/09/2014        No unusual headaches.       Skin: No rash or infections     Thyroid:  No unusual fatigue.     No swelling of feet.     No shortness of breath or chest pain on exertion.     No abdominal pain.  Bowel habits:  No change.       No frequency of urination. Has nocturia 2-3 times and occasionally has mild incontinence      Prostate cancer: This is followed by oncologist; PSA usually has been less than 1.0 and stable      History of erectile dysfunction: This is chronic and has not had much response with using various drugs including Cialis 20 mg. Does not see urologist now      No joint  Pains.      No numbness or tingling in his feet.  History of vitamin B12 deficiency in the past causing anemia but is not taking any supplements now    LABS:  Office Visit on 04/12/2014  Component Date Value Ref Range Status  . POC Glucose 04/12/2014 211* 70 - 99 mg/dl Final  Appointment on 04/09/2014  Component Date Value Ref Range Status  .  Hemoglobin  A1C 04/09/2014 6.3  4.6 - 6.5 % Final   Glycemic Control Guidelines for People with Diabetes:Non Diabetic:  <6%Goal of Therapy: <7%Additional Action Suggested:  >8%   . Sodium 04/09/2014 136  135 - 145 mEq/L Final  . Potassium 04/09/2014 4.6  3.5 - 5.1 mEq/L Final  . Chloride 04/09/2014 103  96 - 112 mEq/L Final  . CO2 04/09/2014 25  19 - 32 mEq/L Final  . Glucose, Bld 04/09/2014 109* 70 - 99 mg/dL Final  . BUN 04/09/2014 11  6 - 23 mg/dL Final  . Creatinine, Ser 04/09/2014 1.0  0.4 - 1.5 mg/dL Final  . Total Bilirubin 04/09/2014 0.6  0.2 - 1.2 mg/dL Final  . Alkaline Phosphatase 04/09/2014 46  39 - 117 U/L Final  . AST 04/09/2014 20  0 - 37 U/L Final  . ALT 04/09/2014 15  0 - 53 U/L Final  . Total Protein 04/09/2014 7.1  6.0 - 8.3 g/dL Final  . Albumin 04/09/2014 4.1  3.5 - 5.2 g/dL Final  . Calcium 04/09/2014 9.0  8.4 - 10.5 mg/dL Final  . GFR 04/09/2014 82.49  >60.00 mL/min Final  . Cholesterol 04/09/2014 151  0 - 200 mg/dL Final   ATP III Classification       Desirable:  < 200 mg/dL               Borderline High:  200 - 239 mg/dL          High:  > = 240 mg/dL  . Triglycerides 04/09/2014 132.0  0.0 - 149.0 mg/dL Final   Normal:  <150 mg/dLBorderline High:  150 - 199 mg/dL  . HDL 04/09/2014 49.40  >39.00 mg/dL Final  . VLDL 04/09/2014 26.4  0.0 - 40.0 mg/dL Final  . LDL Cholesterol 04/09/2014 75  0 - 99 mg/dL Final  . Total CHOL/HDL Ratio 04/09/2014 3   Final                  Men          Women1/2 Average Risk     3.4          3.3Average Risk          5.0          4.42X Average Risk          9.6          7.13X Average Risk          15.0          11.0                      . NonHDL 04/09/2014 101.60   Final   NOTE:  Non-HDL goal should be 30 mg/dL higher than patient's LDL goal (i.e. LDL goal of < 70 mg/dL, would have non-HDL goal of < 100 mg/dL)  . WBC 04/09/2014 7.0  4.0 - 10.5 K/uL Final  . RBC 04/09/2014 4.11* 4.22 - 5.81 Mil/uL Final  . Hemoglobin 04/09/2014 13.3  13.0 -  17.0 g/dL Final  . HCT 04/09/2014 39.3  39.0 - 52.0 % Final  . MCV 04/09/2014 95.8  78.0 - 100.0 fl Final  . MCHC 04/09/2014 33.7  30.0 - 36.0 g/dL Final  . RDW 04/09/2014 13.7  11.5 - 15.5 % Final  . Platelets 04/09/2014 226.0  150.0 - 400.0 K/uL Final  . Neutrophils Relative % 04/09/2014 65.5  43.0 - 77.0 % Final  . Lymphocytes Relative 04/09/2014 22.5  12.0 - 46.0 % Final  . Monocytes Relative 04/09/2014 10.5  3.0 - 12.0 % Final  . Eosinophils Relative 04/09/2014 1.1  0.0 - 5.0 % Final  . Basophils Relative 04/09/2014 0.4  0.0 - 3.0 % Final  . Neutro Abs 04/09/2014 4.6  1.4 - 7.7 K/uL Final  . Lymphs Abs 04/09/2014 1.6  0.7 - 4.0 K/uL Final  . Monocytes Absolute 04/09/2014 0.7  0.1 - 1.0 K/uL Final  . Eosinophils Absolute 04/09/2014 0.1  0.0 - 0.7 K/uL Final  . Basophils Absolute 04/09/2014 0.0  0.0 - 0.1 K/uL Final     Examination:   BP 138/75  Pulse 83  Temp(Src) 98 F (36.7 C)  Resp 16  Ht 5' 10.5" (1.791 m)  Wt 217 lb 3.2 oz (98.521 kg)  BMI 30.71 kg/m2  SpO2 96%  Body mass index is 30.71 kg/(m^2).     GENERAL: Mild generalized obesity present. He appears mildly anxious but otherwise had normal affect  No pallor, clubbing, lymphadenopathy or edema.  Skin:  no rash or pigmentation.  EYES:  Externally normal.  Fundii:  normal discs and vessels.  ENT: Oral mucosa and tongue normal.  THYROID:  Not palpable.  CAROTIDS:  Normal character; no bruit.  HEART:  Normal  S1 and S2; no murmur or click.  CHEST:  Normal shape.  Lungs: Vescicular breath sounds heard equally.  No crepitations/ wheeze.  ABDOMEN:  No distention.  Liver and spleen not palpable.  No other mass or tenderness.  Rectal exam shows no prostate enlargement and no mass in the rectum  NEUROLOGICAL: .Diabetic foot exam shows normal monofilament sensation in the toes and plantar surfaces, no skin lesions or ulcers on the feet and normal pedal pulses Reflexes are absent bilaterally at ankles. Vibration  sense is moderately reduced in the toes  JOINTS:  Normal.   ASSESSMENT/ PLAN:   Depression/anxiety: He has had significant symptoms in the last couple of weeks. His irritability is likely to be part of the depression. Discussed treatment with antidepressants and he was started with Effexor XR 75 mg daily. Discussed that his symptoms are probably related to chemical imbalance and medication will take 1-2 weeks to work. Discussed possible side effects He will call if he is not better within 2 weeks   Diabetes type 2:  Blood glucose control appears overall  good with upper normal A1c No significant diabetes complications evident At home his blood sugars are well controlled although glucose usually higher fasting Encouraged him to check more readings after meals and bring monitor for download on each visit  To exercise with brisk walking more regularly No change in medications  Hypertension: Well controlled and will continue same medications, encouraged him to check periodically at home EKG done today  Hypercholesterolemia: Well controlled on simvastatin  Stool Hemoccult given and tetanus toxoid also administered  To have Prevnar on the next visit  He will discuss erectile dysfunction with Urologist  History of vitamin B12 deficiency: Currently not anemic and will periodically follow, check vitamin B12 level on the next visit  Zaliah Wissner 04/12/2014, 8:03 AM

## 2014-04-15 ENCOUNTER — Other Ambulatory Visit: Payer: Self-pay | Admitting: *Deleted

## 2014-04-15 DIAGNOSIS — Z1211 Encounter for screening for malignant neoplasm of colon: Secondary | ICD-10-CM

## 2014-05-06 ENCOUNTER — Other Ambulatory Visit: Payer: Self-pay | Admitting: Endocrinology

## 2014-05-31 ENCOUNTER — Other Ambulatory Visit: Payer: Self-pay | Admitting: Endocrinology

## 2014-06-25 ENCOUNTER — Other Ambulatory Visit (HOSPITAL_BASED_OUTPATIENT_CLINIC_OR_DEPARTMENT_OTHER): Payer: PRIVATE HEALTH INSURANCE

## 2014-06-25 DIAGNOSIS — C61 Malignant neoplasm of prostate: Secondary | ICD-10-CM

## 2014-06-25 DIAGNOSIS — Z8546 Personal history of malignant neoplasm of prostate: Secondary | ICD-10-CM

## 2014-06-25 LAB — COMPREHENSIVE METABOLIC PANEL (CC13)
ALK PHOS: 50 U/L (ref 40–150)
ALT: 17 U/L (ref 0–55)
ANION GAP: 8 meq/L (ref 3–11)
AST: 15 U/L (ref 5–34)
Albumin: 3.8 g/dL (ref 3.5–5.0)
BUN: 13.9 mg/dL (ref 7.0–26.0)
CO2: 26 meq/L (ref 22–29)
Calcium: 9.2 mg/dL (ref 8.4–10.4)
Chloride: 105 mEq/L (ref 98–109)
Creatinine: 0.9 mg/dL (ref 0.7–1.3)
GLUCOSE: 106 mg/dL (ref 70–140)
POTASSIUM: 4.5 meq/L (ref 3.5–5.1)
Sodium: 139 mEq/L (ref 136–145)
TOTAL PROTEIN: 6.4 g/dL (ref 6.4–8.3)
Total Bilirubin: 0.36 mg/dL (ref 0.20–1.20)

## 2014-06-25 LAB — CBC WITH DIFFERENTIAL/PLATELET
BASO%: 0.7 % (ref 0.0–2.0)
Basophils Absolute: 0.1 10*3/uL (ref 0.0–0.1)
EOS%: 1.5 % (ref 0.0–7.0)
Eosinophils Absolute: 0.1 10*3/uL (ref 0.0–0.5)
HCT: 38.4 % (ref 38.4–49.9)
HGB: 12.8 g/dL — ABNORMAL LOW (ref 13.0–17.1)
LYMPH%: 24.6 % (ref 14.0–49.0)
MCH: 31.8 pg (ref 27.2–33.4)
MCHC: 33.3 g/dL (ref 32.0–36.0)
MCV: 95.3 fL (ref 79.3–98.0)
MONO#: 0.6 10*3/uL (ref 0.1–0.9)
MONO%: 8.7 % (ref 0.0–14.0)
NEUT#: 4.7 10*3/uL (ref 1.5–6.5)
NEUT%: 64.5 % (ref 39.0–75.0)
PLATELETS: 210 10*3/uL (ref 140–400)
RBC: 4.03 10*6/uL — AB (ref 4.20–5.82)
RDW: 12.6 % (ref 11.0–14.6)
WBC: 7.3 10*3/uL (ref 4.0–10.3)
lymph#: 1.8 10*3/uL (ref 0.9–3.3)

## 2014-06-26 LAB — PSA: PSA: 1.33 ng/mL (ref <=4.00)

## 2014-06-27 ENCOUNTER — Telehealth: Payer: Self-pay | Admitting: Oncology

## 2014-06-27 ENCOUNTER — Ambulatory Visit (HOSPITAL_BASED_OUTPATIENT_CLINIC_OR_DEPARTMENT_OTHER): Payer: PRIVATE HEALTH INSURANCE | Admitting: Oncology

## 2014-06-27 ENCOUNTER — Other Ambulatory Visit: Payer: Self-pay | Admitting: Endocrinology

## 2014-06-27 VITALS — BP 146/71 | HR 68 | Temp 98.0°F | Resp 18 | Ht 70.5 in | Wt 218.5 lb

## 2014-06-27 DIAGNOSIS — C61 Malignant neoplasm of prostate: Secondary | ICD-10-CM

## 2014-06-27 NOTE — Telephone Encounter (Signed)
Gave avs & cal for March 2016. °

## 2014-06-27 NOTE — Progress Notes (Signed)
Hematology and Oncology Follow Up Visit  Richard [name] [id number] [birth year] 68 y.o. 06/27/2014 9:26 AM    Principle Diagnosis: 68 year old gentleman with prostate cancer diagnosed in December of 2005. He had a Gleason score 4+3 = 7 with a PSA of 3.66  Prior Therapy: 1. He is status post retropubic prostatectomy and bilateral lymph node dissection done on 11/25/2004. The pathology showed a Gleason score of 4+4 equals 8 with staging of T2b N0. His PSA nadir was at 0.12. 2. he is status post salvage radiation therapy done between March and May of 2007 for a rise in his PSA to 0.29. He received total of 65 gray in 35 fractions. 3. he subsequently had another rise in his PSA up to 1.52. He was treated with Lupron intermittently last treatment was in October of 2010 with a PSA nadir down to 0.08. Most recent PSA was up to 0.53.  Current therapy: Observation and surveillance with intermittent Lupron as needed.  Interim History: Mr. Netz presents today for a followup visit 's wife. Since his last visit, he reports no new symptoms. He was started on Effexor or for anxiety which have helped his symptoms dramatically. He is not reporting any chest pain or shortness of breath is not reporting any back pain is not reporting any genitourinary complaints. His continued to work full time and perform activities of daily living without any hindrance or decline. Is not reporting any constitutional symptoms or musculoskeletal complaints. He has not reported any recent illnesses or hospitalizations. He has not reported any headaches or blurred vision or double vision. Does not report any fevers or chills or sweats. Does not report any chest pain or shortness of breath. As that report any nausea or vomiting. Did not report any frequency urgency or hesitancy. Rest of his review of systems unremarkable.  Medications: I have reviewed the patient's current medications.   Allergies: No Known Allergies  Past  Medical History, Surgical history, Social history, and Family History were reviewed and updated.  Marland Kitchen Physical Exam: Blood pressure 146/71, pulse 68, temperature 98 F (36.7 C), temperature source Oral, resp. rate 18, height 5' 10.5" (1.791 m), weight 218 lb 8 oz (99.111 kg). ECOG: 0 General appearance: alert awake not in any distress to Head: Normocephalic, without obvious abnormality, atraumatic Neck: no adenopathy Lymph nodes: Cervical, supraclavicular, and axillary nodes normal. Heart:regular rate and rhythm, S1, S2 normal, no murmur, click, rub or gallop Lung:chest clear, no wheezing, rales, normal symmetric air entry Abdomin: soft, non-tender, without masses or organomegaly EXT:no erythema, induration, or nodules   Lab Results: Lab Results  Component Value Date   WBC 7.3 06/25/2014   HGB 12.8* 06/25/2014   HCT 38.4 06/25/2014   MCV 95.3 06/25/2014   PLT 210 06/25/2014     Chemistry      Component Value Date/Time   NA 139 06/25/2014 0827   NA 136 04/09/2014 0838   K 4.5 06/25/2014 0827   K 4.6 04/09/2014 0838   CL 103 04/09/2014 0838   CL 102 02/20/2013 1254   CO2 26 06/25/2014 0827   CO2 25 04/09/2014 0838   BUN 13.9 06/25/2014 0827   BUN 11 04/09/2014 0838   CREATININE 0.9 06/25/2014 0827   CREATININE 1.0 04/09/2014 0838      Component Value Date/Time   CALCIUM 9.2 06/25/2014 0827   CALCIUM 9.0 04/09/2014 0838   ALKPHOS 50 06/25/2014 0827   ALKPHOS 46 04/09/2014 0838   AST 15 06/25/2014 0827   AST 20  04/09/2014 0838   ALT 17 06/25/2014 0827   ALT 15 04/09/2014 0838   BILITOT 0.36 06/25/2014 0827   BILITOT 0.6 04/09/2014 0838      Results for MYLON, MABEY (MRN [id number]) as of 06/27/2014 08:47  Ref. Range 10/17/2013 13:08 02/19/2014 08:15 06/25/2014 08:28  PSA Latest Range: <=4.00 ng/mL 0.81 0.76 1.33      Impression and Plan: 68 year old gentleman with:   1. Prostate cancer. He had a Gleason score of 4+4 equals 8, PSA at the time diagnosed at 3.66. He is  currently has biochemically relapsing disease with a very marginal change in his PSA is up from 0.49 to 0.81 and now up to 1.33. Risks and benefits of androgen depravation was rediscussed today and he elected to the continued observation and institute androgen depravation he is PSA has a more rapid doubling time of less than 6 months. At that time we can restage him with a CT scan of the bone scan and we can institute hormonal deprivation as needed.  2. Followup: In 4 months and a repeat PSA at that time.    [facility], MD 10/29/20159:26 AM

## 2014-07-09 ENCOUNTER — Other Ambulatory Visit (INDEPENDENT_AMBULATORY_CARE_PROVIDER_SITE_OTHER): Payer: PRIVATE HEALTH INSURANCE

## 2014-07-09 ENCOUNTER — Other Ambulatory Visit: Payer: PRIVATE HEALTH INSURANCE

## 2014-07-09 DIAGNOSIS — E538 Deficiency of other specified B group vitamins: Secondary | ICD-10-CM

## 2014-07-09 DIAGNOSIS — E119 Type 2 diabetes mellitus without complications: Secondary | ICD-10-CM | POA: Diagnosis not present

## 2014-07-09 LAB — BASIC METABOLIC PANEL
BUN: 14 mg/dL (ref 6–23)
CHLORIDE: 101 meq/L (ref 96–112)
CO2: 22 mEq/L (ref 19–32)
CREATININE: 0.9 mg/dL (ref 0.4–1.5)
Calcium: 9 mg/dL (ref 8.4–10.5)
GFR: 85.5 mL/min (ref >60.00)
Glucose, Bld: 121 mg/dL — ABNORMAL HIGH (ref 70–99)
Potassium: 4.5 mEq/L (ref 3.5–5.1)
Sodium: 137 mEq/L (ref 135–145)

## 2014-07-09 LAB — HEMOGLOBIN A1C: Hgb A1c MFr Bld: 6.3 % (ref 4.6–6.5)

## 2014-07-09 LAB — VITAMIN B12: Vitamin B-12: 117 pg/mL — ABNORMAL LOW (ref 211–911)

## 2014-07-10 ENCOUNTER — Other Ambulatory Visit: Payer: PRIVATE HEALTH INSURANCE

## 2014-07-12 ENCOUNTER — Other Ambulatory Visit: Payer: Self-pay | Admitting: Endocrinology

## 2014-07-15 ENCOUNTER — Ambulatory Visit (INDEPENDENT_AMBULATORY_CARE_PROVIDER_SITE_OTHER): Payer: PRIVATE HEALTH INSURANCE | Admitting: Endocrinology

## 2014-07-15 ENCOUNTER — Encounter: Payer: Self-pay | Admitting: Endocrinology

## 2014-07-15 VITALS — BP 140/70 | HR 72 | Temp 98.3°F | Resp 16 | Ht 70.5 in | Wt 223.0 lb

## 2014-07-15 DIAGNOSIS — E119 Type 2 diabetes mellitus without complications: Secondary | ICD-10-CM | POA: Diagnosis not present

## 2014-07-15 DIAGNOSIS — E538 Deficiency of other specified B group vitamins: Secondary | ICD-10-CM

## 2014-07-15 DIAGNOSIS — F4310 Post-traumatic stress disorder, unspecified: Secondary | ICD-10-CM | POA: Diagnosis not present

## 2014-07-15 DIAGNOSIS — Z23 Encounter for immunization: Secondary | ICD-10-CM

## 2014-07-15 NOTE — Patient Instructions (Signed)
Restart B12 otc 1000ug daily  Start Walking daily  Send stool test back

## 2014-07-15 NOTE — Progress Notes (Signed)
Patient ID: Richard [name], male   DOB: August 04, 1946, 68 y.o.   MRN: [id number]   Reason for Appointment:  Follow-up  History of Present Illness   Problem 1: On the last visit in 8/15 he had been feeling more wired up and anxious. Also was irritable and would tend to get aggressive and angry at times. Also was having some depression with mood swings. He was also not able to focus clearly and he thinks he has more attention deficit. Has no difficulty sleeping. Apparently several years ago he had PTSD  With starting Effexor XR 75 mg on the last visit he has improved significantly and his wife thinks that his anxiety and irritability is under control.  Problem 3: DIABETES MELITUS, date of diagnosis 1995 with a glucose of 320 and HemoglobinA1c of 9.6 Previous history: He has been on various oral hypoglycemic drugs in the past but for the last few years has required multiple drugs in combination; diabetes has been generally well controlled with A1c near normal  Recent history: His blood sugars are again  well controlled with a consistent A1c of 6.3 He tends to have relatively high readings in the mornings Not clear how often he is monitoring his glucose at home as he again did not bring his monitor. He is taking a 4 drug regimen including low dose Amaryl once a day without any hypoglycemia He has not been able to exercise formally and weight is starting to go up     Oral hypoglycemic drugs: Amaryl 2 mg once a day, Janumet 50/1000 twice a day, Actos 15 mg daily Side effects from medications: None Proper timing of medications in relation to meals: Yes.          Monitors blood glucose: ? 3 times a week     Glucometer: One Touch.          Blood Glucose readings:   readings before breakfast: acb 120s and after lunch 140-150  Hypoglycemia:  none         Meals: 3 meals per day, usually watching portions.          Physical activity: exercise: no programmed activity            Dietician visit: Most  recent:? 2007   Weight control:      Wt Readings from Last 3 Encounters:  07/15/14 223 lb (101.152 kg)  06/27/14 218 lb 8 oz (99.111 kg)  04/12/14 217 lb 3.2 oz (98.521 kg)    Diabetes labs:  Lab Results  Component Value Date   HGBA1C 6.3 07/09/2014   HGBA1C 6.3 04/09/2014   HGBA1C 6.3 12/10/2013   Lab Results  Component Value Date   MICROALBUR 7.6* 12/10/2013   LDLCALC 75 04/09/2014   CREATININE 0.9 07/09/2014       Medication List       This list is accurate as of: 07/15/14  8:37 AM.  Always use your most recent med list.               amLODipine 5 MG tablet  Commonly known as:  NORVASC  TAKE 1 TABLET (5 MG TOTAL) BY MOUTH DAILY.     glimepiride 2 MG tablet  Commonly known as:  AMARYL  TAKE 1 TABLET (2 MG TOTAL) BY MOUTH DAILY WITH BREAKFAST.     JANUMET 50-1000 MG per tablet  Generic drug:  sitaGLIPtin-metformin  TAKE 1 TABLET BY MOUTH 2 (TWO) TIMES DAILY WITH A MEAL.     pioglitazone 30  MG tablet  Commonly known as:  ACTOS  TAKE 1 TABLET (30 MG TOTAL) BY MOUTH DAILY.     ramipril 10 MG capsule  Commonly known as:  ALTACE  TAKE 1 CAPSULE (10 MG TOTAL) BY MOUTH DAILY.     simvastatin 40 MG tablet  Commonly known as:  ZOCOR  TAKE 1 TABLET (40 MG TOTAL) BY MOUTH DAILY.     venlafaxine XR 75 MG 24 hr capsule  Commonly known as:  EFFEXOR-XR  TAKE 1 CAPSULE (75 MG TOTAL) BY MOUTH DAILY WITH BREAKFAST.        Allergies: No Known Allergies  Past Medical History  Diagnosis Date  . Cancer     Prostate    No past surgical history on file.  Family History  Problem Relation Age of Onset  . Hypertension Father     Social History:  reports that he has been smoking.  He has never used smokeless tobacco. His alcohol and drug histories are not on file.    REVIEW OF SYSTEMS:       Hypertension:  blood pressure has been very well controlled with 10 mg ramipril, amlodipine 5 mg. Does not monitor at home   Lipids:  he has been compliant with his  simvastatin 40 mg LDL at goal and HDL/triglycerides are normal  Lab Results  Component Value Date   CHOL 151 04/09/2014   HDL 49.40 04/09/2014   LDLCALC 75 04/09/2014   TRIG 132.0 04/09/2014   CHOLHDL 3 04/09/2014     History of vitamin B12 deficiency in the past causing anemia but is not taking any supplements again despite reminders.  Does not complain of excessive fatigue but his B-12 is significantly low    LABS:  Appointment on 07/09/2014  Component Date Value Ref Range Status  . Hgb A1c MFr Bld 07/09/2014 6.3  4.6 - 6.5 % Final   Glycemic Control Guidelines for People with Diabetes:Non Diabetic:  <6%Goal of Therapy: <7%Additional Action Suggested:  >8%   . Sodium 07/09/2014 137  135 - 145 mEq/L Final  . Potassium 07/09/2014 4.5  3.5 - 5.1 mEq/L Final  . Chloride 07/09/2014 101  96 - 112 mEq/L Final  . CO2 07/09/2014 22  19 - 32 mEq/L Final  . Glucose, Bld 07/09/2014 121* 70 - 99 mg/dL Final  . BUN 07/09/2014 14  6 - 23 mg/dL Final  . Creatinine, Ser 07/09/2014 0.9  0.4 - 1.5 mg/dL Final  . Calcium 07/09/2014 9.0  8.4 - 10.5 mg/dL Final  . GFR 07/09/2014 85.50  >60.00 mL/min Final  . Vitamin B-12 07/09/2014 117* 211 - 911 pg/mL Final     Examination:   BP 140/70 mmHg  Pulse 72  Temp(Src) 98.3 F (36.8 C)  Resp 16  Ht 5' 10.5" (1.791 m)  Wt 223 lb (101.152 kg)  BMI 31.53 kg/m2  SpO2 95%  Body mass index is 31.53 kg/(m^2).     Repeat blood pressure 130/72   ASSESSMENT/ PLAN:   Depression/anxiety: He has had significant improvement in his symptoms with Effexor XR 75 mg which he is tolerating well He will continue the same dose unchanged and reassured him that he can take this long-term if needed  Diabetes type 2 with mild obesity:  Blood glucose control this still fairly good with upper normal A1c At home his blood sugars are looking fairly good although glucose usually higher fasting Reminded him to check more readings after meals and bring monitor for  download on each  visit  To exercise with brisk walking more regularly  Hypertension: Well controlled and will continue same medications  Stool Hemoccult needs to be completed    To have Prevnar today  History of vitamin B12 deficiency:  He will restart 1000 g daily  Richard [name] 07/15/2014, 8:37 AM

## 2014-08-12 ENCOUNTER — Telehealth: Payer: Self-pay | Admitting: Endocrinology

## 2014-08-12 NOTE — Telephone Encounter (Signed)
Pt has on lower back and shoulders and legs and arms a rash that is very uncomfortable he believes it is from starting the venlafaxine? It is itchy but not painful.

## 2014-08-12 NOTE — Telephone Encounter (Signed)
Unlikely from medications since he has been on it for several weeks.  He can take OTC Allegra, Claritin or Zyrtec and if not better will refer to dermatology

## 2014-08-12 NOTE — Telephone Encounter (Signed)
Noted, patients wife is aware. 

## 2014-08-12 NOTE — Telephone Encounter (Signed)
Please see below and advise.

## 2014-08-16 ENCOUNTER — Other Ambulatory Visit: Payer: Self-pay | Admitting: Endocrinology

## 2014-08-16 ENCOUNTER — Telehealth: Payer: Self-pay | Admitting: Endocrinology

## 2014-08-16 DIAGNOSIS — R21 Rash and other nonspecific skin eruption: Secondary | ICD-10-CM

## 2014-08-16 NOTE — Telephone Encounter (Signed)
Rash has not subsided and he does want a dermatology referral

## 2014-08-16 NOTE — Telephone Encounter (Signed)
Please see below.

## 2014-08-16 NOTE — Telephone Encounter (Signed)
Referral made 

## 2014-09-02 ENCOUNTER — Other Ambulatory Visit: Payer: Self-pay | Admitting: Dermatology

## 2014-09-02 DIAGNOSIS — L270 Generalized skin eruption due to drugs and medicaments taken internally: Secondary | ICD-10-CM | POA: Diagnosis not present

## 2014-09-02 DIAGNOSIS — D225 Melanocytic nevi of trunk: Secondary | ICD-10-CM | POA: Diagnosis not present

## 2014-09-02 DIAGNOSIS — L738 Other specified follicular disorders: Secondary | ICD-10-CM | POA: Diagnosis not present

## 2014-09-23 ENCOUNTER — Other Ambulatory Visit: Payer: Self-pay | Admitting: *Deleted

## 2014-09-23 MED ORDER — GLIMEPIRIDE 2 MG PO TABS: ORAL_TABLET | ORAL | Status: DC

## 2014-09-23 MED ORDER — SIMVASTATIN 40 MG PO TABS: ORAL_TABLET | ORAL | Status: DC

## 2014-09-24 ENCOUNTER — Other Ambulatory Visit: Payer: Self-pay | Admitting: Dermatology

## 2014-09-24 DIAGNOSIS — L270 Generalized skin eruption due to drugs and medicaments taken internally: Secondary | ICD-10-CM | POA: Diagnosis not present

## 2014-09-24 DIAGNOSIS — L986 Other infiltrative disorders of the skin and subcutaneous tissue: Secondary | ICD-10-CM | POA: Diagnosis not present

## 2014-10-07 ENCOUNTER — Other Ambulatory Visit: Payer: Self-pay | Admitting: *Deleted

## 2014-10-07 MED ORDER — RAMIPRIL 10 MG PO CAPS: ORAL_CAPSULE | ORAL | Status: DC

## 2014-10-29 ENCOUNTER — Other Ambulatory Visit (HOSPITAL_BASED_OUTPATIENT_CLINIC_OR_DEPARTMENT_OTHER): Payer: PRIVATE HEALTH INSURANCE

## 2014-10-29 DIAGNOSIS — C61 Malignant neoplasm of prostate: Secondary | ICD-10-CM | POA: Diagnosis not present

## 2014-10-29 LAB — CBC WITH DIFFERENTIAL/PLATELET
BASO%: 1.3 % (ref 0.0–2.0)
Basophils Absolute: 0.1 10*3/uL (ref 0.0–0.1)
EOS%: 5.4 % (ref 0.0–7.0)
Eosinophils Absolute: 0.3 10*3/uL (ref 0.0–0.5)
HCT: 40.9 % (ref 38.4–49.9)
HGB: 13.4 g/dL (ref 13.0–17.1)
LYMPH#: 1.5 10*3/uL (ref 0.9–3.3)
LYMPH%: 25.7 % (ref 14.0–49.0)
MCH: 31.7 pg (ref 27.2–33.4)
MCHC: 32.9 g/dL (ref 32.0–36.0)
MCV: 96.5 fL (ref 79.3–98.0)
MONO#: 0.7 10*3/uL (ref 0.1–0.9)
MONO%: 12.1 % (ref 0.0–14.0)
NEUT#: 3.2 10*3/uL (ref 1.5–6.5)
NEUT%: 55.5 % (ref 39.0–75.0)
Platelets: 250 10*3/uL (ref 140–400)
RBC: 4.24 10*6/uL (ref 4.20–5.82)
RDW: 13.2 % (ref 11.0–14.6)
WBC: 5.9 10*3/uL (ref 4.0–10.3)

## 2014-10-29 LAB — COMPREHENSIVE METABOLIC PANEL (CC13)
ALK PHOS: 57 U/L (ref 40–150)
ALT: 17 U/L (ref 0–55)
AST: 16 U/L (ref 5–34)
Albumin: 4 g/dL (ref 3.5–5.0)
Anion Gap: 10 mEq/L (ref 3–11)
BILIRUBIN TOTAL: 0.53 mg/dL (ref 0.20–1.20)
BUN: 15.1 mg/dL (ref 7.0–26.0)
CO2: 26 mEq/L (ref 22–29)
Calcium: 9.3 mg/dL (ref 8.4–10.4)
Chloride: 106 mEq/L (ref 98–109)
Creatinine: 1 mg/dL (ref 0.7–1.3)
EGFR: 81 mL/min/{1.73_m2} — ABNORMAL LOW (ref >90)
Glucose: 119 mg/dl (ref 70–140)
Potassium: 4.7 mEq/L (ref 3.5–5.1)
SODIUM: 143 meq/L (ref 136–145)
TOTAL PROTEIN: 6.6 g/dL (ref 6.4–8.3)

## 2014-10-30 LAB — PSA: PSA: 4.31 ng/mL — AB (ref <=4.00)

## 2014-10-30 LAB — TESTOSTERONE: Testosterone: 422 ng/dL (ref 300–890)

## 2014-10-31 ENCOUNTER — Ambulatory Visit (HOSPITAL_BASED_OUTPATIENT_CLINIC_OR_DEPARTMENT_OTHER): Payer: PRIVATE HEALTH INSURANCE | Admitting: Oncology

## 2014-10-31 ENCOUNTER — Ambulatory Visit: Payer: PRIVATE HEALTH INSURANCE | Admitting: Oncology

## 2014-10-31 ENCOUNTER — Telehealth: Payer: Self-pay | Admitting: Oncology

## 2014-10-31 VITALS — BP 145/69 | HR 86 | Temp 98.1°F | Resp 18 | Ht 70.5 in | Wt 225.3 lb

## 2014-10-31 DIAGNOSIS — Z8546 Personal history of malignant neoplasm of prostate: Secondary | ICD-10-CM

## 2014-10-31 DIAGNOSIS — C61 Malignant neoplasm of prostate: Secondary | ICD-10-CM | POA: Diagnosis not present

## 2014-10-31 NOTE — Telephone Encounter (Signed)
gv adn printed appt sched and avs for pt for April....gv pt barium °

## 2014-10-31 NOTE — Progress Notes (Signed)
Hematology and Oncology Follow Up Visit  Richard [name] [id number] August 19, 1946 69 y.o. 10/31/2014 9:25 AM    Principle Diagnosis: 69 year old gentleman with prostate cancer diagnosed in December of 2005. He had a Gleason score 4+3 = 7 with a PSA of 3.66  Prior Therapy: 1. He is status post retropubic prostatectomy and bilateral lymph node dissection done on 11/25/2004. The pathology showed a Gleason score of 4+4 equals 8 with staging of T2b N0. His PSA nadir was at 0.12. 2. he is status post salvage radiation therapy done between March and May of 2007 for a rise in his PSA to 0.29. He received total of 65 gray in 35 fractions. 3. he subsequently had another rise in his PSA up to 1.52. He was treated with Lupron intermittently last treatment was in October of 2010 with a PSA nadir down to 0.08. Most recent PSA was up to 0.53.  Current therapy: Observation and surveillance with intermittent Lupron as needed.  Interim History: [name] presents today for a followup visit 's wife. Since his last visit, he he reported diffuse rash that has been problematic for him since the last visit. He has been started on topical creams without any effective resolution and most recently started on prednisone which have helped his symptoms. He is not reporting any chest pain or shortness of breath is not reporting any back pain is not reporting any genitourinary complaints. His continued to work full time and perform activities of daily living without any hindrance or decline. Is not reporting any constitutional symptoms or musculoskeletal complaints. He has not reported any recent illnesses or hospitalizations. He has not reported any headaches or blurred vision or double vision. Does not report any fevers or chills or sweats. Does not report any chest pain or shortness of breath. As that report any nausea or vomiting. Did not report any frequency urgency or hesitancy. Rest of his review of systems  unremarkable.  Medications: I have reviewed the patient's current medications.   Allergies:  Allergies  Allergen Reactions  . Effexor Xr [Venlafaxine Hcl] Rash    Itchy rash all over    Past Medical History, Surgical history, Social history, and Family History were reviewed and updated.  Marland Kitchen Physical Exam: Blood pressure 145/69, pulse 86, temperature 98.1 F (36.7 C), temperature source Oral, resp. rate 18, height 5' 10.5" (1.791 m), weight 225 lb 4.8 oz (102.195 kg), SpO2 98 %. ECOG: 0 General appearance: alert awake not in any distress to Head: Normocephalic, without obvious abnormality, atraumatic Neck: no adenopathy Lymph nodes: Cervical, supraclavicular, and axillary nodes normal. Heart:regular rate and rhythm, S1, S2 normal, no murmur, click, rub or gallop Lung:chest clear, no wheezing, rales, normal symmetric air entry Abdomin: soft, non-tender, without masses or organomegaly EXT:no erythema, induration, or nodules Skin: Maculopapular rash slightly faint noted in the trunk and the back.  Lab Results: Lab Results  Component Value Date   WBC 5.9 10/29/2014   HGB 13.4 10/29/2014   HCT 40.9 10/29/2014   MCV 96.5 10/29/2014   PLT 250 10/29/2014     Chemistry      Component Value Date/Time   NA 143 10/29/2014 0836   NA 137 07/09/2014 0953   K 4.7 10/29/2014 0836   K 4.5 07/09/2014 0953   CL 101 07/09/2014 0953   CL 102 02/20/2013 1254   CO2 26 10/29/2014 0836   CO2 22 07/09/2014 0953   BUN 15.1 10/29/2014 0836   BUN 14 07/09/2014 0953   CREATININE 1.0  10/29/2014 0836   CREATININE 0.9 07/09/2014 0953      Component Value Date/Time   CALCIUM 9.3 10/29/2014 0836   CALCIUM 9.0 07/09/2014 0953   ALKPHOS 57 10/29/2014 0836   ALKPHOS 46 04/09/2014 0838   AST 16 10/29/2014 0836   AST 20 04/09/2014 0838   ALT 17 10/29/2014 0836   ALT 15 04/09/2014 0838   BILITOT 0.53 10/29/2014 0836   BILITOT 0.6 04/09/2014 0838       Results for MCIHAEL, HINDERMAN (MRN  [id number]) as of 10/31/2014 08:49  Ref. Range 02/19/2014 08:15 06/25/2014 08:28 10/29/2014 08:35  PSA Latest Range: <=4.00 ng/mL 0.76 1.33 4.31 (H)      Impression and Plan: 69 year old gentleman with:   1. Prostate cancer. He had a Gleason score of 4+4 equals 8, PSA at the time diagnosed at 3.66. He is currently has biochemically relapsing disease with recent PSA is up from 1.33 to 4.31. The plan is to restage him with a CT scan and a bone scan and repeat his PSA in 6 weeks. If he has measurable disease and his PSA continues to rise then we will restart his hormone therapy. His PSA declines with the next check and he does not have any measurable disease then we will hold off on any treatment. He has been on androgen deprivation therapy in the past and would be reasonable to resume it if his PSA continues to rise as it is. Certainly would make sense to start it if he has measurable disease.  2. Followup: In 6 weeks and a repeat PSA at that time.    [facility], MD 3/3/20169:25 AM

## 2014-11-07 ENCOUNTER — Other Ambulatory Visit: Payer: Self-pay | Admitting: *Deleted

## 2014-11-07 MED ORDER — AMLODIPINE BESYLATE 5 MG PO TABS: ORAL_TABLET | ORAL | Status: DC

## 2014-11-07 MED ORDER — PIOGLITAZONE HCL 30 MG PO TABS: ORAL_TABLET | ORAL | Status: DC

## 2014-11-08 ENCOUNTER — Other Ambulatory Visit: Payer: PRIVATE HEALTH INSURANCE

## 2014-11-13 ENCOUNTER — Ambulatory Visit: Payer: PRIVATE HEALTH INSURANCE | Admitting: Endocrinology

## 2014-11-14 ENCOUNTER — Ambulatory Visit (HOSPITAL_COMMUNITY)
Admission: RE | Admit: 2014-11-14 | Discharge: 2014-11-14 | Disposition: A | Discharge status: Home / Self Care | Payer: PRIVATE HEALTH INSURANCE | Source: Ambulatory Visit | Attending: Oncology | Admitting: Oncology

## 2014-11-14 ENCOUNTER — Encounter (HOSPITAL_COMMUNITY): Payer: Self-pay

## 2014-11-14 ENCOUNTER — Encounter (HOSPITAL_COMMUNITY)
Admission: RE | Admit: 2014-11-14 | Discharge: 2014-11-14 | Disposition: A | Discharge status: Home / Self Care | Payer: PRIVATE HEALTH INSURANCE | Source: Ambulatory Visit | Attending: Oncology | Admitting: Oncology

## 2014-11-14 DIAGNOSIS — C61 Malignant neoplasm of prostate: Secondary | ICD-10-CM | POA: Insufficient documentation

## 2014-11-14 DIAGNOSIS — Z8546 Personal history of malignant neoplasm of prostate: Secondary | ICD-10-CM

## 2014-11-14 MED ORDER — IOHEXOL 300 MG/ML  SOLN
100.0000 mL | Freq: Once | INTRAMUSCULAR | Status: AC | PRN
  Administered 2014-11-14: 100 mL via INTRAVENOUS

## 2014-11-14 MED ORDER — TECHNETIUM TC 99M MEDRONATE IV KIT
25.0000 | PACK | Freq: Once | INTRAVENOUS | Status: AC | PRN
  Administered 2014-11-14: 25 via INTRAVENOUS

## 2014-11-18 ENCOUNTER — Other Ambulatory Visit: Payer: Self-pay | Admitting: *Deleted

## 2014-11-18 MED ORDER — SITAGLIPTIN PHOS-METFORMIN HCL 50-1000 MG PO TABS: ORAL_TABLET | ORAL | Status: DC

## 2014-11-20 ENCOUNTER — Encounter: Payer: Self-pay | Admitting: Endocrinology

## 2014-11-20 ENCOUNTER — Ambulatory Visit (INDEPENDENT_AMBULATORY_CARE_PROVIDER_SITE_OTHER): Payer: PRIVATE HEALTH INSURANCE | Admitting: Endocrinology

## 2014-11-20 ENCOUNTER — Telehealth: Payer: Self-pay | Admitting: Endocrinology

## 2014-11-20 VITALS — BP 147/68 | HR 85 | Temp 98.0°F | Resp 14 | Ht 70.5 in | Wt 226.0 lb

## 2014-11-20 DIAGNOSIS — L200 Besnier's prurigo: Secondary | ICD-10-CM | POA: Diagnosis not present

## 2014-11-20 DIAGNOSIS — L239 Allergic contact dermatitis, unspecified cause: Secondary | ICD-10-CM

## 2014-11-20 MED ORDER — GLIMEPIRIDE 4 MG PO TABS: 2.0000 mg | ORAL_TABLET | Freq: Every day | ORAL | Status: DC

## 2014-11-20 NOTE — Progress Notes (Signed)
Subjective:     Patient ID: Richard [name], male   DOB: 09-12-1945, 69 y.o.   MRN: [id number]  HPI  Chief complaint: Rash and itching  Since about December also he has had recurrent problems with rash associated with severe itching. Rashes mostly in the lower legs, forearms and back area. Since he thought it was related to Effexor this was stopped but the rash has continued He was not able to control symptoms with OTC allergy tablets and has seen a dermatologist twice  He has had at least 2 rounds of short-term prednisone in addition to topical treatment.  He says he gets full relief with the prednisone but does not respond to the topical steroid. Also not able to sleep at night because of itching even though he may take Atarax  He is due to see the dermatologist again tomorrow but is asking about other options  Review of Systems    he thinks his blood sugars have not increased and he took the prednisone but has not checked it regularly   Objective:   Physical Exam He has a maculopapular reddish rash on his lower back bilaterally and the size of the macules are about 1-1.5 cm which are not coalescent Has minimal rash on the lower legs    Assessment:     Allergic rash of unclear etiology He has not been on any medications and he has had about the same medication for diabetes for 7-10 years     Plan:     Discussed checking skin reactivity to various allergens with help of an allergist Dry taking half of 4 mg Amaryl instead of 2 mg in case he has allergy to the dye in the 2 mg tablet Allergy consultation will be done    [facility]

## 2014-11-20 NOTE — Telephone Encounter (Signed)
Left message to call and schedule appointment for today.

## 2014-11-20 NOTE — Telephone Encounter (Signed)
We can see him today

## 2014-11-20 NOTE — Telephone Encounter (Signed)
Patient wife stated her husband has  Severe rash he had it since Dec, but it came back 2 days ago, is could he please be seen today, his rash is from head to toe

## 2014-11-21 ENCOUNTER — Other Ambulatory Visit: Payer: Self-pay | Admitting: Dermatology

## 2014-11-21 DIAGNOSIS — L565 Disseminated superficial actinic porokeratosis (DSAP): Secondary | ICD-10-CM | POA: Diagnosis not present

## 2014-11-21 DIAGNOSIS — R21 Rash and other nonspecific skin eruption: Secondary | ICD-10-CM | POA: Diagnosis not present

## 2014-11-21 DIAGNOSIS — Q828 Other specified congenital malformations of skin: Secondary | ICD-10-CM | POA: Diagnosis not present

## 2014-12-04 DIAGNOSIS — Z720 Tobacco use: Secondary | ICD-10-CM | POA: Diagnosis not present

## 2014-12-04 DIAGNOSIS — R0602 Shortness of breath: Secondary | ICD-10-CM | POA: Diagnosis not present

## 2014-12-04 DIAGNOSIS — J310 Chronic rhinitis: Secondary | ICD-10-CM | POA: Diagnosis not present

## 2014-12-04 DIAGNOSIS — L259 Unspecified contact dermatitis, unspecified cause: Secondary | ICD-10-CM | POA: Diagnosis not present

## 2014-12-17 ENCOUNTER — Other Ambulatory Visit (HOSPITAL_BASED_OUTPATIENT_CLINIC_OR_DEPARTMENT_OTHER): Payer: PRIVATE HEALTH INSURANCE

## 2014-12-17 DIAGNOSIS — Z8546 Personal history of malignant neoplasm of prostate: Secondary | ICD-10-CM | POA: Diagnosis not present

## 2014-12-17 LAB — COMPREHENSIVE METABOLIC PANEL (CC13)
ALK PHOS: 52 U/L (ref 40–150)
ALT: 22 U/L (ref 0–55)
ANION GAP: 11 meq/L (ref 3–11)
AST: 18 U/L (ref 5–34)
Albumin: 3.4 g/dL — ABNORMAL LOW (ref 3.5–5.0)
BUN: 13.1 mg/dL (ref 7.0–26.0)
CO2: 25 mEq/L (ref 22–29)
Calcium: 8.8 mg/dL (ref 8.4–10.4)
Chloride: 104 mEq/L (ref 98–109)
Creatinine: 0.9 mg/dL (ref 0.7–1.3)
EGFR: 87 mL/min/{1.73_m2} — AB (ref >90)
Glucose: 115 mg/dl (ref 70–140)
POTASSIUM: 4.5 meq/L (ref 3.5–5.1)
SODIUM: 141 meq/L (ref 136–145)
Total Bilirubin: 0.4 mg/dL (ref 0.20–1.20)
Total Protein: 6 g/dL — ABNORMAL LOW (ref 6.4–8.3)

## 2014-12-17 LAB — CBC WITH DIFFERENTIAL/PLATELET
BASO%: 0.6 % (ref 0.0–2.0)
Basophils Absolute: 0 10*3/uL (ref 0.0–0.1)
EOS%: 4.1 % (ref 0.0–7.0)
Eosinophils Absolute: 0.3 10*3/uL (ref 0.0–0.5)
HCT: 38.9 % (ref 38.4–49.9)
HGB: 12.7 g/dL — ABNORMAL LOW (ref 13.0–17.1)
LYMPH%: 24.7 % (ref 14.0–49.0)
MCH: 31.8 pg (ref 27.2–33.4)
MCHC: 32.6 g/dL (ref 32.0–36.0)
MCV: 97.3 fL (ref 79.3–98.0)
MONO#: 0.7 10*3/uL (ref 0.1–0.9)
MONO%: 11.5 % (ref 0.0–14.0)
NEUT#: 3.6 10*3/uL (ref 1.5–6.5)
NEUT%: 59.1 % (ref 39.0–75.0)
Platelets: 185 10*3/uL (ref 140–400)
RBC: 4 10*6/uL — AB (ref 4.20–5.82)
RDW: 13.3 % (ref 11.0–14.6)
WBC: 6.2 10*3/uL (ref 4.0–10.3)
lymph#: 1.5 10*3/uL (ref 0.9–3.3)

## 2014-12-18 ENCOUNTER — Ambulatory Visit (HOSPITAL_BASED_OUTPATIENT_CLINIC_OR_DEPARTMENT_OTHER): Payer: PRIVATE HEALTH INSURANCE | Admitting: Oncology

## 2014-12-18 ENCOUNTER — Telehealth: Payer: Self-pay | Admitting: Oncology

## 2014-12-18 VITALS — BP 149/69 | HR 77 | Temp 98.0°F | Resp 18 | Ht 70.5 in | Wt 226.4 lb

## 2014-12-18 DIAGNOSIS — C61 Malignant neoplasm of prostate: Secondary | ICD-10-CM

## 2014-12-18 DIAGNOSIS — R21 Rash and other nonspecific skin eruption: Secondary | ICD-10-CM | POA: Diagnosis not present

## 2014-12-18 LAB — PSA: PSA: 8.91 ng/mL — ABNORMAL HIGH (ref <=4.00)

## 2014-12-18 NOTE — Telephone Encounter (Signed)
Pt confirmed labs/ov per 04/20 POF, gave pt AVS and Calendar.... KJ  °

## 2014-12-18 NOTE — Progress Notes (Signed)
Hematology and Oncology Follow Up Visit  Richard [name] [id number] 03-Sep-1945 69 y.o. 12/18/2014 9:42 AM    Principle Diagnosis: 69 year old gentleman with prostate cancer diagnosed in December of 2005. He had a Gleason score 4+3 = 7 with a PSA of 3.66  Prior Therapy: 1. He is status post retropubic prostatectomy and bilateral lymph node dissection done on 11/25/2004. The pathology showed a Gleason score of 4+4 equals 8 with staging of T2b N0. His PSA nadir was at 0.12. 2. he is status post salvage radiation therapy done between March and May of 2007 for a rise in his PSA to 0.29. He received total of 65 gray in 35 fractions. 3. he subsequently had another rise in his PSA up to 1.52. He was treated with Lupron intermittently last treatment was in October of 2010 with a PSA nadir down to 0.08. Most recent PSA was up to 0.53.  Current therapy: Intermittent androgen deprivation for biochemically relapsing prostate cancer.  Interim History: [name] presents today for a followup visit with his wife. Since his last visit, he  reports that his rash improved with topical creams and prednisone. He still have some itching associated with it but not interfering with his function. He does not report any other complaints.  He is not reporting any chest pain or shortness of breath is not reporting any back pain is not reporting any genitourinary complaints. His continued to work full time and perform activities of daily living without any hindrance or decline. Is not reporting any constitutional symptoms or musculoskeletal complaints. He has not reported any recent illnesses or hospitalizations. He has not reported any headaches or blurred vision or double vision. Does not report any fevers or chills or sweats. Does not report any chest pain or shortness of breath. As that report any nausea or vomiting. Did not report any frequency urgency or hesitancy. Rest of his review of systems  unremarkable.  Medications: I have reviewed the patient's current medications.   Allergies:  Allergies  Allergen Reactions  . Effexor Xr [Venlafaxine Hcl] Rash    Itchy rash all over    Past Medical History, Surgical history, Social history, and Family History were reviewed and updated.  Marland Kitchen Physical Exam: Blood pressure 149/69, pulse 77, temperature 98 F (36.7 C), temperature source Oral, resp. rate 18, height 5' 10.5" (1.791 m), weight 226 lb 6.4 oz (102.694 kg), SpO2 100 %. ECOG: 0 General appearance: alert awake not in any distress to Head: Normocephalic, without obvious abnormality, atraumatic Neck: no adenopathy Lymph nodes: Cervical, supraclavicular, and axillary nodes normal. Heart:regular rate and rhythm, S1, S2 normal, no murmur, click, rub or gallop Lung:chest clear, no wheezing, rales, normal symmetric air entry Abdomin: soft, non-tender, without masses or organomegaly EXT:no erythema, induration, or nodules Skin: Maculopapular rash slightly faint noted in the trunk and the back. improved from previously.  Lab Results: Lab Results  Component Value Date   WBC 6.2 12/17/2014   HGB 12.7* 12/17/2014   HCT 38.9 12/17/2014   MCV 97.3 12/17/2014   PLT 185 12/17/2014     Chemistry      Component Value Date/Time   NA 141 12/17/2014 0820   NA 137 07/09/2014 0953   K 4.5 12/17/2014 0820   K 4.5 07/09/2014 0953   CL 101 07/09/2014 0953   CL 102 02/20/2013 1254   CO2 25 12/17/2014 0820   CO2 22 07/09/2014 0953   BUN 13.1 12/17/2014 0820   BUN 14 07/09/2014 0953   CREATININE  0.9 12/17/2014 0820   CREATININE 0.9 07/09/2014 0953      Component Value Date/Time   CALCIUM 8.8 12/17/2014 0820   CALCIUM 9.0 07/09/2014 0953   ALKPHOS 52 12/17/2014 0820   ALKPHOS 46 04/09/2014 0838   AST 18 12/17/2014 0820   AST 20 04/09/2014 0838   ALT 22 12/17/2014 0820   ALT 15 04/09/2014 0838   BILITOT 0.40 12/17/2014 0820   BILITOT 0.6 04/09/2014 0838       Results for  ALWIN, LANIGAN (MRN [id number]) as of 12/18/2014 09:28  Ref. Range 06/25/2014 08:28 10/29/2014 08:35 12/17/2014 08:20  PSA Latest Ref Range: <=4.00 ng/mL 1.33 4.31 (H) 8.91 (H)   CT ABDOMEN AND PELVIS WITH CONTRAST  TECHNIQUE: Multidetector CT imaging of the abdomen and pelvis was performed using the standard protocol following bolus administration of intravenous contrast.  CONTRAST: 155mL OMNIPAQUE IOHEXOL 300 MG/ML SOLN  COMPARISON: Bone scan 02/07/2008. No prior cross-sectional imaging for comparison.  FINDINGS: Lower chest: Motion artifact degrades imaging at the lung bases. Lung bases are grossly clear.  Hepatobiliary: Normal allowing for motion artifact. No focal hepatic abnormality. Gallbladder appears normal.  Pancreas: Normal  Spleen: Normal  Adrenals/Urinary Tract: Adrenal glands are unremarkable. 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 No surrounding fluid or high density to suggest hemorrhage at this time. No hydronephrosis. No radiopaque renal or ureteral calculus.  Stomach/Bowel: No bowel wall thickening or focal segmental dilatation is identified. Normal appendix. Stomach appears normal.  Vascular/Lymphatic: Moderate atheromatous aortic calcification noted without aneurysm. No mesenteric, porta hepatis, retroperitoneal, or pelvic lymphadenopathy. Pericaval 4 mm node is identified image 61, nonspecific but amenable to followup at future exams.  Other: Fat containing left inguinal hernia. The bladder is decompressed, with concentric wall thickness at upper limits of normal. No free air or fluid. Prostate presumed surgically absent. No surrounding mass.  Musculoskeletal: Lumbar spine disc degenerative change noted. No lytic or sclerotic osseous lesion. No acute osseous abnormality.  IMPRESSION: No evidence for intra-abdominal or pelvic recurrent or metastatic disease. No acute  abnormality.  EXAM: NUCLEAR MEDICINE WHOLE BODY BONE SCAN  TECHNIQUE: Whole body anterior and posterior images were obtained approximately 3 hours after intravenous injection of radiopharmaceutical.  RADIOPHARMACEUTICALS: Twenty-seven mCi Technetium-99 MDP  COMPARISON: Bone scan dated 02/07/2008  FINDINGS: There are no areas of abnormal activity suggestive of metastatic disease. Are slight increased activity in the mid cervical spine consistent with degenerative disc and joint disease. Sulcal slight increased activity in both feet and in the left patella consistent with degenerative changes. Activity at the sternoclavicular joints is symmetrical and unchanged.  IMPRESSION: No findings suggestive of metastatic disease to the skeleton.    Impression and Plan: 69 year old gentleman with:   1. Prostate cancer. He had a Gleason score of 4+4 equals 8, PSA at the time diagnosed at 3.66. He has biochemically relapsing disease with PSA of  To 8.91. Staging workup including a bone scan and CT scan showed no evidence of disease. Options of treatment were discussed today including continued observation, resuming intermittent androgen deprivation versus clinical trial enrollment. He elected to restart Lupron as he did in the past which we will do in the near future. Risks and benefits of restarting Lupron was discussed these would include complications such as hot flashes, osteoporosis, weight gain among others. He is willing to proceed.  2. Diffuse rash: Currently improved he follows up by dermatology.  3. Followup: In 8 weeks and  a repeat PSA at that time.    [facility], MD 4/20/20169:42 AM

## 2014-12-20 ENCOUNTER — Ambulatory Visit (HOSPITAL_BASED_OUTPATIENT_CLINIC_OR_DEPARTMENT_OTHER): Payer: PRIVATE HEALTH INSURANCE

## 2014-12-20 VITALS — BP 147/65 | HR 84 | Temp 98.2°F

## 2014-12-20 DIAGNOSIS — Z5111 Encounter for antineoplastic chemotherapy: Secondary | ICD-10-CM

## 2014-12-20 DIAGNOSIS — C61 Malignant neoplasm of prostate: Secondary | ICD-10-CM

## 2014-12-20 DIAGNOSIS — Z8546 Personal history of malignant neoplasm of prostate: Secondary | ICD-10-CM

## 2014-12-20 MED ORDER — LEUPROLIDE ACETATE (4 MONTH) 30 MG IM KIT
30.0000 mg | PACK | Freq: Once | INTRAMUSCULAR | Status: AC
  Administered 2014-12-20: 30 mg via INTRAMUSCULAR
  Filled 2014-12-20: qty 30

## 2014-12-20 NOTE — Patient Instructions (Signed)
Leuprolide depot injection or implant What is this medicine? LEUPROLIDE (loo PROE lide) is a man-made protein that acts like a natural hormone in the body. It decreases testosterone in men and decreases estrogen in women. In men, this medicine is used to treat advanced prostate cancer. In women, some forms of this medicine may be used to treat endometriosis, uterine fibroids, or other male hormone-related problems. This medicine may be used for other purposes; ask your health care provider or pharmacist if you have questions. COMMON BRAND NAME(S): Eligard, Lupron Depot, Lupron Depot-Ped, Viadur What should I tell my health care provider before I take this medicine? They need to know if you have any of these conditions: -diabetes -heart disease or previous heart attack -high blood pressure -high cholesterol -osteoporosis -pain or difficulty passing urine -spinal cord metastasis -stroke -tobacco smoker -unusual vaginal bleeding (women) -an unusual or allergic reaction to leuprolide, benzyl alcohol, other medicines, foods, dyes, or preservatives -pregnant or trying to get pregnant -breast-feeding How should I use this medicine? This medicine is for injection into a muscle or for implant or injection under the skin. It is given by a health care professional in a hospital or clinic setting. The specific product will determine how it will be given to you. Make sure you understand which product you receive and how often you will receive it. Talk to your pediatrician regarding the use of this medicine in children. Special care may be needed. Overdosage: If you think you have taken too much of this medicine contact a poison control center or emergency room at once. NOTE: This medicine is only for you. Do not share this medicine with others. What if I miss a dose? It is important not to miss a dose. Call your doctor or health care professional if you are unable to keep an appointment. Depot  injections: Depot injections are given either once-monthly, every 12 weeks, every 16 weeks, or every 24 weeks depending on the product you are prescribed. The product you are prescribed will be based on if you are male or male, and your condition. Make sure you understand your product and dosing. Implant dosing: The implant is removed and replaced once a year. The implant is only used in males. What may interact with this medicine? Do not take this medicine with any of the following medications: -chasteberry This medicine may also interact with the following medications: -herbal or dietary supplements, like black cohosh or DHEA -male hormones, like estrogens or progestins and birth control pills, patches, rings, or injections -male hormones, like testosterone This list may not describe all possible interactions. Give your health care provider a list of all the medicines, herbs, non-prescription drugs, or dietary supplements you use. Also tell them if you smoke, drink alcohol, or use illegal drugs. Some items may interact with your medicine. What should I watch for while using this medicine? Visit your doctor or health care professional for regular checks on your progress. During the first weeks of treatment, your symptoms may get worse, but then will improve as you continue your treatment. You may get hot flashes, increased bone pain, increased difficulty passing urine, or an aggravation of nerve symptoms. Discuss these effects with your doctor or health care professional, some of them may improve with continued use of this medicine. Male patients may experience a menstrual cycle or spotting during the first months of therapy with this medicine. If this continues, contact your doctor or health care professional. What side effects may I notice from   receiving this medicine? Side effects that you should report to your doctor or health care professional as soon as possible: -allergic reactions like  skin rash, itching or hives, swelling of the face, lips, or tongue -breathing problems -chest pain -depression or memory disorders -pain in your legs or groin -pain at site where injected or implanted -severe headache -swelling of the feet and legs -visual changes -vomiting Side effects that usually do not require medical attention (report to your doctor or health care professional if they continue or are bothersome): -breast swelling or tenderness -decrease in sex drive or performance -diarrhea -hot flashes -loss of appetite -muscle, joint, or bone pains -nausea -redness or irritation at site where injected or implanted -skin problems or acne This list may not describe all possible side effects. Call your doctor for medical advice about side effects. You may report side effects to FDA at 1-800-FDA-1088. Where should I keep my medicine? This drug is given in a hospital or clinic and will not be stored at home. NOTE: This sheet is a summary. It may not cover all possible information. If you have questions about this medicine, talk to your doctor, pharmacist, or health care provider.  2015, [employer] Standard. (2010-02-17 14:41:21)  

## 2014-12-24 ENCOUNTER — Other Ambulatory Visit (INDEPENDENT_AMBULATORY_CARE_PROVIDER_SITE_OTHER): Payer: PRIVATE HEALTH INSURANCE

## 2014-12-24 DIAGNOSIS — E119 Type 2 diabetes mellitus without complications: Secondary | ICD-10-CM | POA: Diagnosis not present

## 2014-12-24 LAB — BASIC METABOLIC PANEL
BUN: 9 mg/dL (ref 6–23)
CHLORIDE: 102 meq/L (ref 96–112)
CO2: 30 mEq/L (ref 19–32)
Calcium: 9 mg/dL (ref 8.4–10.5)
Creatinine, Ser: 0.93 mg/dL (ref 0.40–1.50)
GFR: 85.39 mL/min (ref >60.00)
Glucose, Bld: 95 mg/dL (ref 70–99)
Potassium: 4.2 mEq/L (ref 3.5–5.1)
Sodium: 136 mEq/L (ref 135–145)

## 2014-12-24 LAB — HEMOGLOBIN A1C: Hgb A1c MFr Bld: 7.1 % — ABNORMAL HIGH (ref 4.6–6.5)

## 2014-12-27 ENCOUNTER — Encounter: Payer: Self-pay | Admitting: Endocrinology

## 2014-12-27 ENCOUNTER — Ambulatory Visit (INDEPENDENT_AMBULATORY_CARE_PROVIDER_SITE_OTHER): Payer: PRIVATE HEALTH INSURANCE | Admitting: Endocrinology

## 2014-12-27 VITALS — BP 138/66 | HR 76 | Temp 98.0°F | Resp 16 | Ht 70.5 in | Wt 225.6 lb

## 2014-12-27 DIAGNOSIS — E785 Hyperlipidemia, unspecified: Secondary | ICD-10-CM

## 2014-12-27 DIAGNOSIS — E119 Type 2 diabetes mellitus without complications: Secondary | ICD-10-CM

## 2014-12-27 DIAGNOSIS — E538 Deficiency of other specified B group vitamins: Secondary | ICD-10-CM

## 2014-12-27 MED ORDER — METFORMIN HCL 1000 MG PO TABS: 1000.0000 mg | ORAL_TABLET | Freq: Two times a day (BID) | ORAL | Status: DC

## 2014-12-27 NOTE — Progress Notes (Signed)
Patient ID: Richard [name], male   DOB: [birth year], 69 y.o.   MRN: [id number]   Reason for Appointment:  Follow-up  History of Present Illness   Problem 1: Skin rash and itching: This has resolved in the last week.  Allergist did not find any offending substances and he was treated with Zantac and Zyrtec  Problem 3: DIABETES MELITUS, date of diagnosis 1995 with a glucose of 320 and HemoglobinA1c of 9.6 Previous history: He has been on various oral hypoglycemic drugs in the past but for the last few years has required multiple drugs in combination; diabetes has been generally well controlled with A1c near normal  Recent history:  His blood sugars are reportedly fairly good recently at home A1c has gone up probably because of high sugars when he took prednisone periodically in the last few months for his rash Has not done any readings after meals as directed He tends to have relatively high readings in the mornings Not clear how often he is monitoring his glucose at home as he again did not bring his monitor. He is taking a 4 drug regimen including low dose Amaryl once a day without any hypoglycemia However he is complaining about the number of medications and wants to reduce them Also has not been exercising except some walking recently     Oral hypoglycemic drugs: Amaryl 2 mg once a day in a.m., Janumet 50/1000 twice a day, Actos 15 mg daily Side effects from medications: None       Monitors blood glucose: ? 3 times a week     Glucometer: One Touch.          Blood Glucose readings:   readings before breakfast: acb 120-135    Hypoglycemia:  none         Meals: 3 meals per day, usually watching portions.          Physical activity: exercise: Starting to walk recently Dietician visit: Most recent:? 2007   Weight control:      Wt Readings from Last 3 Encounters:  12/27/14 225 lb 9.6 oz (102.331 kg)  12/18/14 226 lb 6.4 oz (102.694 kg)  11/20/14 226 lb (102.513 kg)     Diabetes labs:  Lab Results  Component Value Date   HGBA1C 7.1* 12/24/2014   HGBA1C 6.3 07/09/2014   HGBA1C 6.3 04/09/2014   Lab Results  Component Value Date   MICROALBUR 7.6* 12/10/2013   LDLCALC 75 04/09/2014   CREATININE 0.93 12/24/2014       Medication List       This list is accurate as of: 12/27/14  3:57 PM.  Always use your most recent med list.               amLODipine 5 MG tablet  Commonly known as:  NORVASC  TAKE 1 TABLET (5 MG TOTAL) BY MOUTH DAILY.     glimepiride 4 MG tablet  Commonly known as:  AMARYL  Take 0.5 tablets (2 mg total) by mouth daily before breakfast.     hydrOXYzine 25 MG tablet  Commonly known as:  ATARAX/VISTARIL  Take 25 mg by mouth at bedtime as needed.     pioglitazone 30 MG tablet  Commonly known as:  ACTOS  TAKE 1 TABLET (30 MG TOTAL) BY MOUTH DAILY.     ramipril 10 MG capsule  Commonly known as:  ALTACE  TAKE 1 CAPSULE (10 MG TOTAL) BY MOUTH DAILY.     simvastatin 40 MG tablet  Commonly known as:  ZOCOR  TAKE 1 TABLET (40 MG TOTAL) BY MOUTH DAILY.     sitaGLIPtin-metformin 50-1000 MG per tablet  Commonly known as:  JANUMET  TAKE 1 TABLET BY MOUTH 2 (TWO) TIMES DAILY WITH A MEAL.        Allergies:  Allergies  Allergen Reactions  . Effexor Xr [Venlafaxine Hcl] Rash    Itchy rash all over    Past Medical History  Diagnosis Date  . prostate ca dx'd 2006    prostatectomy and xrt    No past surgical history on file.  Family History  Problem Relation Age of Onset  . Hypertension Father     Social History:  reports that he has been smoking.  He has never used smokeless tobacco. His alcohol and drug histories are not on file.    REVIEW OF SYSTEMS:       Hypertension:  blood pressure has been very well controlled with 10 mg ramipril, amlodipine 5 mg. Does not monitor at home   Lipids:  he has been compliant with his simvastatin 40 mg LDL at goal and HDL/triglycerides are normal  Lab Results   Component Value Date   CHOL 151 04/09/2014   HDL 49.40 04/09/2014   LDLCALC 75 04/09/2014   TRIG 132.0 04/09/2014   CHOLHDL 3 04/09/2014     History of vitamin B12 deficiency in the past causing anemia but is not taking any supplements again despite reminders.  Does not complain of excessive fatigue but his B-12 is significantly low    LABS:  Lab on 12/24/2014  Component Date Value Ref Range Status  . Hgb A1c MFr Bld 12/24/2014 7.1* 4.6 - 6.5 % Final   Glycemic Control Guidelines for People with Diabetes:Non Diabetic:  <6%Goal of Therapy: <7%Additional Action Suggested:  >8%   . Sodium 12/24/2014 136  135 - 145 mEq/L Final  . Potassium 12/24/2014 4.2  3.5 - 5.1 mEq/L Final  . Chloride 12/24/2014 102  96 - 112 mEq/L Final  . CO2 12/24/2014 30  19 - 32 mEq/L Final  . Glucose, Bld 12/24/2014 95  70 - 99 mg/dL Final  . BUN 12/24/2014 9  6 - 23 mg/dL Final  . Creatinine, Ser 12/24/2014 0.93  0.40 - 1.50 mg/dL Final  . Calcium 12/24/2014 9.0  8.4 - 10.5 mg/dL Final  . GFR 12/24/2014 85.39  >60.00 mL/min Final     Examination:   BP 149/68 mmHg  Pulse 76  Temp(Src) 98 F (36.7 C)  Resp 16  Ht 5' 10.5" (1.791 m)  Wt 225 lb 9.6 oz (102.331 kg)  BMI 31.90 kg/m2  SpO2 97%  Body mass index is 31.9 kg/(m^2).     Repeat blood pressure 138/66 He still has diffuse faintly reddish macular areas on his forearms  ASSESSMENT/ PLAN:    Diabetes type 2 with mild obesity:  Blood glucose control has been overall worse because of taking prednisone for his rash However recent blood sugars in the mornings are fairly good Reminded him to start checking readings after meals and bring his monitor for download He is complaining about multiple medications especially with going on Medicare For now will try to have him reduce metformin 2 g daily alone and try to leave off the Januvia 4 months Advised him to go back on Janumet if his blood sugars started going up Also reminded him to be consistent  with diet and exercise  Will need urine microalbumin retested on the next visit  Hypertension: Well controlled and will continue same medications, tends to have high pressure initially in the office  History of vitamin B12 deficiency:  He will continue 1000 g daily  Amar Keenum 12/27/2014, 3:57 PM

## 2014-12-27 NOTE — Patient Instructions (Signed)
Please check blood sugars at least half the time about 2 hours after any meal and 3 times per week on waking up.  Please bring blood sugar monitor to each visit. Recommended blood sugar levels about 2 hours after meal is 140-160 and on waking up 90-130

## 2014-12-31 ENCOUNTER — Other Ambulatory Visit: Payer: Self-pay | Admitting: *Deleted

## 2014-12-31 ENCOUNTER — Telehealth: Payer: Self-pay | Admitting: Endocrinology

## 2014-12-31 MED ORDER — RAMIPRIL 10 MG PO CAPS: ORAL_CAPSULE | ORAL | Status: DC

## 2014-12-31 NOTE — Telephone Encounter (Signed)
Pt wife called and said he needs a prescription for glucose motor and blood puressure motor and strips for glucose so insurance will cover

## 2015-01-01 ENCOUNTER — Telehealth: Payer: Self-pay | Admitting: Endocrinology

## 2015-01-01 ENCOUNTER — Other Ambulatory Visit: Payer: Self-pay | Admitting: *Deleted

## 2015-01-01 MED ORDER — ONETOUCH DELICA LANCETS FINE MISC
Status: DC
Start: 2015-01-01 — End: 2015-09-24

## 2015-01-01 MED ORDER — ONETOUCH ULTRA 2 W/DEVICE KIT: PACK | Status: DC

## 2015-01-01 MED ORDER — GLUCOSE BLOOD VI STRP: ORAL_STRIP | Status: DC

## 2015-01-01 NOTE — Telephone Encounter (Signed)
Glucose meter is one touch ultra.

## 2015-01-26 DIAGNOSIS — H6011 Cellulitis of right external ear: Secondary | ICD-10-CM | POA: Diagnosis not present

## 2015-02-18 ENCOUNTER — Other Ambulatory Visit (HOSPITAL_BASED_OUTPATIENT_CLINIC_OR_DEPARTMENT_OTHER): Payer: PRIVATE HEALTH INSURANCE

## 2015-02-18 DIAGNOSIS — C61 Malignant neoplasm of prostate: Secondary | ICD-10-CM

## 2015-02-18 LAB — CBC WITH DIFFERENTIAL/PLATELET
BASO%: 0.5 % (ref 0.0–2.0)
BASOS ABS: 0 10*3/uL (ref 0.0–0.1)
EOS ABS: 0.3 10*3/uL (ref 0.0–0.5)
EOS%: 4.9 % (ref 0.0–7.0)
HEMATOCRIT: 38.6 % (ref 38.4–49.9)
HEMOGLOBIN: 13 g/dL (ref 13.0–17.1)
LYMPH#: 1.8 10*3/uL (ref 0.9–3.3)
LYMPH%: 31.4 % (ref 14.0–49.0)
MCH: 31.8 pg (ref 27.2–33.4)
MCHC: 33.7 g/dL (ref 32.0–36.0)
MCV: 94.4 fL (ref 79.3–98.0)
MONO#: 0.6 10*3/uL (ref 0.1–0.9)
MONO%: 9.8 % (ref 0.0–14.0)
NEUT%: 53.4 % (ref 39.0–75.0)
NEUTROS ABS: 3 10*3/uL (ref 1.5–6.5)
PLATELETS: 205 10*3/uL (ref 140–400)
RBC: 4.09 10*6/uL — ABNORMAL LOW (ref 4.20–5.82)
RDW: 12.9 % (ref 11.0–14.6)
WBC: 5.7 10*3/uL (ref 4.0–10.3)

## 2015-02-18 LAB — COMPREHENSIVE METABOLIC PANEL (CC13)
ALK PHOS: 55 U/L (ref 40–150)
ALT: 15 U/L (ref 0–55)
ANION GAP: 7 meq/L (ref 3–11)
AST: 15 U/L (ref 5–34)
Albumin: 3.7 g/dL (ref 3.5–5.0)
BUN: 14.5 mg/dL (ref 7.0–26.0)
CHLORIDE: 106 meq/L (ref 98–109)
CO2: 27 mEq/L (ref 22–29)
CREATININE: 0.9 mg/dL (ref 0.7–1.3)
Calcium: 9.2 mg/dL (ref 8.4–10.4)
EGFR: 87 mL/min/{1.73_m2} — ABNORMAL LOW (ref >90)
Glucose: 110 mg/dl (ref 70–140)
POTASSIUM: 4.7 meq/L (ref 3.5–5.1)
Sodium: 141 mEq/L (ref 136–145)
Total Bilirubin: 0.42 mg/dL (ref 0.20–1.20)
Total Protein: 6.4 g/dL (ref 6.4–8.3)

## 2015-02-19 LAB — PSA: PSA: 0.27 ng/mL (ref <=4.00)

## 2015-02-21 ENCOUNTER — Ambulatory Visit (HOSPITAL_BASED_OUTPATIENT_CLINIC_OR_DEPARTMENT_OTHER): Payer: PRIVATE HEALTH INSURANCE | Admitting: Oncology

## 2015-02-21 ENCOUNTER — Telehealth: Payer: Self-pay | Admitting: Oncology

## 2015-02-21 ENCOUNTER — Other Ambulatory Visit: Payer: Self-pay | Admitting: *Deleted

## 2015-02-21 VITALS — BP 136/67 | HR 63 | Temp 98.0°F | Resp 18

## 2015-02-21 DIAGNOSIS — C61 Malignant neoplasm of prostate: Secondary | ICD-10-CM

## 2015-02-21 MED ORDER — METFORMIN HCL 1000 MG PO TABS: 1000.0000 mg | ORAL_TABLET | Freq: Two times a day (BID) | ORAL | Status: DC

## 2015-02-21 NOTE — Progress Notes (Signed)
Hematology and Oncology Follow Up Visit  Richard [name] [id number] 02-15-1946 69 y.o. 02/21/2015 9:18 AM    Principle Diagnosis: 69 year old gentleman with prostate cancer diagnosed in December of 2005. He had a Gleason score 4+3 = 7 with a PSA of 3.66  Prior Therapy: 1. He is status post retropubic prostatectomy and bilateral lymph node dissection done on 11/25/2004. The pathology showed a Gleason score of 4+4 equals 8 with staging of T2b N0. His PSA nadir was at 0.12. 2. he is status post salvage radiation therapy done between March and May of 2007 for a rise in his PSA to 0.29. He received total of 65 gray in 35 fractions. 3. he subsequently had another rise in his PSA up to 1.52. He was treated with Lupron intermittently last treatment was in October of 2010 with a PSA nadir down to 0.08. Most recent PSA was up to 8.91.   Current therapy: Intermittent androgen deprivation for biochemically relapsing prostate cancer. Lupron was restarted in April 2016 he is currently receiving 40 mg every 4 months.  Interim History: Richard [name] presents today for a followup visit with his wife. Since his last visit, he started Lupron and developed a few complications. He did report hot flashes, fatigue and depression associated with it. A lot of the side effects have subsided at this time. He does not report any new complaints. He is not reporting any chest pain or shortness of breath is not reporting any back pain is not reporting any genitourinary complaints. His continued to work full time and perform activities of daily living without any hindrance or decline. Is not reporting any constitutional symptoms or musculoskeletal complaints. He has not reported any recent illnesses or hospitalizations. He has not reported any headaches or blurred vision or double vision. Does not report any fevers or chills or sweats. Does not report any chest pain or shortness of breath. As that report any nausea or vomiting. Did  not report any frequency urgency or hesitancy. Rest of his review of systems unremarkable.  Medications: I have reviewed the patient's current medications.   Allergies:  Allergies  Allergen Reactions  . Effexor Xr [Venlafaxine Hcl] Rash    Past Medical History, Surgical history, Social history, and Family History were reviewed and updated.  Marland Kitchen Physical Exam: Blood pressure 136/67, pulse 63, temperature 98 F (36.7 C), temperature source Oral, resp. rate 18, SpO2 100 %. ECOG: 0 General appearance: alert awake not in any distress to Head: Normocephalic, without obvious abnormality, atraumatic Neck: no adenopathy Lymph nodes: Cervical, supraclavicular, and axillary nodes normal. Heart:regular rate and rhythm, S1, S2 normal, no murmur, click, rub or gallop Lung:chest clear, no wheezing, rales, normal symmetric air entry Abdomin: soft, non-tender, without masses or organomegaly EXT:no erythema, induration, or nodules Skin: No rashes or lesions noted.  Lab Results: Lab Results  Component Value Date   WBC 5.7 02/18/2015   HGB 13.0 02/18/2015   HCT 38.6 02/18/2015   MCV 94.4 02/18/2015   PLT 205 02/18/2015     Chemistry      Component Value Date/Time   NA 141 02/18/2015 0822   NA 136 12/24/2014 1105   K 4.7 02/18/2015 0822   K 4.2 12/24/2014 1105   CL 102 12/24/2014 1105   CL 102 02/20/2013 1254   CO2 27 02/18/2015 0822   CO2 30 12/24/2014 1105   BUN 14.5 02/18/2015 0822   BUN 9 12/24/2014 1105   CREATININE 0.9 02/18/2015 0822   CREATININE 0.93 12/24/2014 1105  Component Value Date/Time   CALCIUM 9.2 02/18/2015 0822   CALCIUM 9.0 12/24/2014 1105   ALKPHOS 55 02/18/2015 0822   ALKPHOS 46 04/09/2014 0838   AST 15 02/18/2015 0822   AST 20 04/09/2014 0838   ALT 15 02/18/2015 0822   ALT 15 04/09/2014 0838   BILITOT 0.42 02/18/2015 0822   BILITOT 0.6 04/09/2014 0838      Results for Richard [name] (MRN [id number]) as of 02/21/2015 08:03  Ref. Range 12/17/2014  08:20 02/18/2015 08:22  PSA Latest Ref Range: <=4.00 ng/mL 8.91 (H) 0.27     Impression and Plan: 69 year old gentleman with:   1. Prostate cancer. He had a Gleason score of 4+4 equals 8, PSA at the time diagnosed at 3.66. He has biochemically relapsing disease with PSA of  To 8.91. Staging workup including a bone scan and CT scan showed no evidence of disease.  He is currently on area deprivation only in the form of Lupron that was started in April 2016. After 2 months of therapy, his PSA is down to 0.27. He did have few complications associated with this medication which have resolved at this time. The plan is to continue with Richard [name] deprivation only to drive his PSA as low as possible. We can certainly go back to intermittent allergen deprivation in the future depending on his preference.  2. Diffuse rash: Resolved at this time.  3. Followup: In 2 months for Lupron injection in 6 months for a repeat visit.    [facility], MD 6/24/20169:18 AM

## 2015-02-21 NOTE — Telephone Encounter (Signed)
per pof to sch pt appt-gave pt avs °

## 2015-02-25 ENCOUNTER — Other Ambulatory Visit: Payer: Self-pay | Admitting: *Deleted

## 2015-02-25 ENCOUNTER — Other Ambulatory Visit: Payer: Self-pay | Admitting: Endocrinology

## 2015-02-25 MED ORDER — PIOGLITAZONE HCL 30 MG PO TABS: ORAL_TABLET | ORAL | Status: DC

## 2015-02-25 MED ORDER — AMLODIPINE BESYLATE 5 MG PO TABS: ORAL_TABLET | ORAL | Status: DC

## 2015-03-11 ENCOUNTER — Other Ambulatory Visit: Payer: Self-pay | Admitting: Endocrinology

## 2015-04-23 ENCOUNTER — Other Ambulatory Visit: Payer: PRIVATE HEALTH INSURANCE

## 2015-04-23 ENCOUNTER — Other Ambulatory Visit (INDEPENDENT_AMBULATORY_CARE_PROVIDER_SITE_OTHER): Payer: PRIVATE HEALTH INSURANCE

## 2015-04-23 DIAGNOSIS — E538 Deficiency of other specified B group vitamins: Secondary | ICD-10-CM

## 2015-04-23 DIAGNOSIS — E119 Type 2 diabetes mellitus without complications: Secondary | ICD-10-CM | POA: Diagnosis not present

## 2015-04-23 DIAGNOSIS — E785 Hyperlipidemia, unspecified: Secondary | ICD-10-CM

## 2015-04-23 LAB — COMPREHENSIVE METABOLIC PANEL
ALT: 15 U/L (ref 0–53)
AST: 14 U/L (ref 0–37)
Albumin: 4 g/dL (ref 3.5–5.2)
Alkaline Phosphatase: 51 U/L (ref 39–117)
BUN: 17 mg/dL (ref 6–23)
CHLORIDE: 103 meq/L (ref 96–112)
CO2: 29 meq/L (ref 19–32)
CREATININE: 0.83 mg/dL (ref 0.40–1.50)
Calcium: 9.4 mg/dL (ref 8.4–10.5)
GFR: 97.28 mL/min (ref >60.00)
GLUCOSE: 123 mg/dL — AB (ref 70–99)
Potassium: 4.8 mEq/L (ref 3.5–5.1)
Sodium: 139 mEq/L (ref 135–145)
Total Bilirubin: 0.4 mg/dL (ref 0.2–1.2)
Total Protein: 6.6 g/dL (ref 6.0–8.3)

## 2015-04-23 LAB — LIPID PANEL
CHOL/HDL RATIO: 4
Cholesterol: 228 mg/dL — ABNORMAL HIGH (ref 0–200)
HDL: 55 mg/dL (ref >39.00)
LDL CALC: 134 mg/dL — AB (ref 0–99)
NonHDL: 172.66
Triglycerides: 195 mg/dL — ABNORMAL HIGH (ref 0.0–149.0)
VLDL: 39 mg/dL (ref 0.0–40.0)

## 2015-04-23 LAB — CBC
HEMATOCRIT: 39.6 % (ref 39.0–52.0)
Hemoglobin: 13.3 g/dL (ref 13.0–17.0)
MCHC: 33.5 g/dL (ref 30.0–36.0)
MCV: 94.8 fl (ref 78.0–100.0)
Platelets: 264 10*3/uL (ref 150.0–400.0)
RBC: 4.17 Mil/uL — ABNORMAL LOW (ref 4.22–5.81)
RDW: 13.1 % (ref 11.5–15.5)
WBC: 6.9 10*3/uL (ref 4.0–10.5)

## 2015-04-23 LAB — MICROALBUMIN / CREATININE URINE RATIO
Creatinine,U: 191.3 mg/dL
MICROALB UR: 5.3 mg/dL — AB (ref 0.0–1.9)
Microalb Creat Ratio: 2.8 mg/g (ref 0.0–30.0)

## 2015-04-23 LAB — HEMOGLOBIN A1C: HEMOGLOBIN A1C: 6.2 % (ref 4.6–6.5)

## 2015-04-26 ENCOUNTER — Ambulatory Visit (HOSPITAL_BASED_OUTPATIENT_CLINIC_OR_DEPARTMENT_OTHER): Payer: PRIVATE HEALTH INSURANCE

## 2015-04-26 VITALS — BP 145/69 | HR 67 | Temp 97.6°F | Resp 18

## 2015-04-26 DIAGNOSIS — C61 Malignant neoplasm of prostate: Secondary | ICD-10-CM

## 2015-04-26 DIAGNOSIS — Z5111 Encounter for antineoplastic chemotherapy: Secondary | ICD-10-CM | POA: Diagnosis not present

## 2015-04-26 DIAGNOSIS — Z8546 Personal history of malignant neoplasm of prostate: Secondary | ICD-10-CM

## 2015-04-26 MED ORDER — LEUPROLIDE ACETATE (4 MONTH) 30 MG IM KIT
30.0000 mg | PACK | Freq: Once | INTRAMUSCULAR | Status: AC
  Administered 2015-04-26: 30 mg via INTRAMUSCULAR

## 2015-04-28 ENCOUNTER — Ambulatory Visit (INDEPENDENT_AMBULATORY_CARE_PROVIDER_SITE_OTHER): Payer: PRIVATE HEALTH INSURANCE | Admitting: Endocrinology

## 2015-04-28 ENCOUNTER — Other Ambulatory Visit: Payer: Self-pay | Admitting: *Deleted

## 2015-04-28 ENCOUNTER — Encounter: Payer: Self-pay | Admitting: Endocrinology

## 2015-04-28 VITALS — BP 136/80 | HR 75 | Temp 98.0°F | Resp 16 | Ht 70.5 in | Wt 220.6 lb

## 2015-04-28 DIAGNOSIS — E119 Type 2 diabetes mellitus without complications: Secondary | ICD-10-CM | POA: Diagnosis not present

## 2015-04-28 DIAGNOSIS — E785 Hyperlipidemia, unspecified: Secondary | ICD-10-CM

## 2015-04-28 MED ORDER — ATORVASTATIN CALCIUM 10 MG PO TABS: 10.0000 mg | ORAL_TABLET | Freq: Every day | ORAL | Status: DC

## 2015-04-28 NOTE — Patient Instructions (Signed)
Start exercise  Check blood sugars on waking up .Marland Kitchen 2-3 .Marland Kitchen times a week Also check blood sugars about 2 hours after a meal and do this after different meals by rotation  Recommended blood sugar levels on waking up is 90-130 and about 2 hours after meal is 140-160 Please bring blood sugar monitor to each visit.

## 2015-04-28 NOTE — Progress Notes (Signed)
Patient ID: Richard [name], male   DOB: Apr 06, 1946, 69 y.o.   MRN: [id number]   Reason for Appointment:  Follow-up  History of Present Illness   Problem 1:  DIABETES MELITUS, date of diagnosis 1995 with a glucose of 320 and HemoglobinA1c of 9.6 Previous history: He has been on various oral hypoglycemic drugs in the past but for the last few years has required multiple drugs in combination; diabetes has been generally well controlled with A1c near normal  Recent history:  His blood sugars are much better now with his going off prednisone that he had taken earlier this year However he has not checked his blood sugars in about a month Because of his potential for allergic reactions to medications his Janumet was changed to metformin alone  A1c is back to normal He says that he is walking quite a bit at work but does not like to do any exercise on his own Not clear if he has any postprandial higher sugars since he does not monitor after meals, has only one unusually high reading of 227 He is taking a 3 drug regimen including low dose Amaryl once a day without any hypoglycemia Lab glucose was 128 fasting     Oral hypoglycemic drugs: Amaryl 2 mg once a day in a.m., Janumet 50/1000 twice a day, Actos 15 mg daily Side effects from medications: None       Monitors blood glucose:  irregularly     Glucometer: One Touch.          Blood Glucose readings:   readings before breakfast not checked, evening readings range from 123-227, afternoon readings 100 with overall median 124 and no readings since 7/17  Hypoglycemia:  none         Meals: 3 meals per day, usually watching portions.          Physical activity: exercise: No programmed activity Dietician visit: Most recent:? 2007   Weight control:      Wt Readings from Last 3 Encounters:  04/28/15 220 lb 9.6 oz (100.064 kg)  12/27/14 225 lb 9.6 oz (102.331 kg)  12/18/14 226 lb 6.4 oz (102.694 kg)    Diabetes labs:  Lab Results    Component Value Date   HGBA1C 6.2 04/23/2015   HGBA1C 7.1* 12/24/2014   HGBA1C 6.3 07/09/2014   Lab Results  Component Value Date   MICROALBUR 5.3* 04/23/2015   LDLCALC 134* 04/23/2015   CREATININE 0.83 04/23/2015       Medication List       This list is accurate as of: 04/28/15  9:01 AM.  Always use your most recent med list.               amLODipine 5 MG tablet  Commonly known as:  NORVASC  TAKE 1 TABLET (5 MG TOTAL) BY MOUTH DAILY.     atorvastatin 10 MG tablet  Commonly known as:  LIPITOR  Take 1 tablet (10 mg total) by mouth daily.     glimepiride 2 MG tablet  Commonly known as:  AMARYL  TAKE 1 TABLET (2 MG TOTAL) BY MOUTH DAILY WITH BREAKFAST.     glucose blood test strip  Commonly known as:  ONE TOUCH ULTRA TEST  Use as instructed to check blood sugar 3 times per week dx code E11.9     metFORMIN 1000 MG tablet  Commonly known as:  GLUCOPHAGE  Take 1 tablet (1,000 mg total) by mouth 2 (two) times daily with  a meal.     ONE TOUCH ULTRA 2 W/DEVICE Kit  Use to check blood sugar 3 times per week dx code Y80.1     ONETOUCH DELICA LANCETS FINE Misc  Use to check blood sugar 3 times per week dx code E11.9     pioglitazone 30 MG tablet  Commonly known as:  ACTOS  TAKE 1 TABLET (30 MG TOTAL) BY MOUTH DAILY.     ramipril 10 MG capsule  Commonly known as:  ALTACE  TAKE 1 CAPSULE (10 MG TOTAL) BY MOUTH DAILY.        Allergies:  Allergies  Allergen Reactions  . Effexor Xr [Venlafaxine Hcl] Rash    Past Medical History  Diagnosis Date  . prostate ca dx'd 2006    prostatectomy and xrt    No past surgical history on file.  Family History  Problem Relation Age of Onset  . Hypertension Father     Social History:  reports that he has been smoking.  He has never used smokeless tobacco. His alcohol and drug histories are not on file.    REVIEW OF SYSTEMS:       Hypertension:  blood pressure has been  well controlled with 10 mg ramipril, amlodipine  5 mg. Does  monitor at home and usually gets a systolic of about 655  Lipids:  he has been off simvastatin as he was eliminating medication that potentially had caused a rash LDL is higher again   Lab Results  Component Value Date   CHOL 228* 04/23/2015   HDL 55.00 04/23/2015   LDLCALC 134* 04/23/2015   TRIG 195.0* 04/23/2015   CHOLHDL 4 04/23/2015     History of vitamin B12 deficiency in the past causing anemia.  Has been asked to restart supplements     LABS:  Appointment on 04/23/2015  Component Date Value Ref Range Status  . Hgb A1c MFr Bld 04/23/2015 6.2  4.6 - 6.5 % Final   Glycemic Control Guidelines for People with Diabetes:Non Diabetic:  <6%Goal of Therapy: <7%Additional Action Suggested:  >8%   . Sodium 04/23/2015 139  135 - 145 mEq/L Final  . Potassium 04/23/2015 4.8  3.5 - 5.1 mEq/L Final  . Chloride 04/23/2015 103  96 - 112 mEq/L Final  . CO2 04/23/2015 29  19 - 32 mEq/L Final  . Glucose, Bld 04/23/2015 123* 70 - 99 mg/dL Final  . BUN 04/23/2015 17  6 - 23 mg/dL Final  . Creatinine, Ser 04/23/2015 0.83  0.40 - 1.50 mg/dL Final  . Total Bilirubin 04/23/2015 0.4  0.2 - 1.2 mg/dL Final  . Alkaline Phosphatase 04/23/2015 51  39 - 117 U/L Final  . AST 04/23/2015 14  0 - 37 U/L Final  . ALT 04/23/2015 15  0 - 53 U/L Final  . Total Protein 04/23/2015 6.6  6.0 - 8.3 g/dL Final  . Albumin 04/23/2015 4.0  3.5 - 5.2 g/dL Final  . Calcium 04/23/2015 9.4  8.4 - 10.5 mg/dL Final  . GFR 04/23/2015 97.28  >60.00 mL/min Final  . Cholesterol 04/23/2015 228* 0 - 200 mg/dL Final   ATP III Classification       Desirable:  < 200 mg/dL               Borderline High:  200 - 239 mg/dL          High:  > = 240 mg/dL  . Triglycerides 04/23/2015 195.0* 0.0 - 149.0 mg/dL Final   Normal:  <150  mg/dLBorderline High:  150 - 199 mg/dL  . HDL 04/23/2015 55.00  >39.00 mg/dL Final  . VLDL 04/23/2015 39.0  0.0 - 40.0 mg/dL Final  . LDL Cholesterol 04/23/2015 134* 0 - 99 mg/dL Final  . Total  CHOL/HDL Ratio 04/23/2015 4   Final                  Men          Women1/2 Average Risk     3.4          3.3Average Risk          5.0          4.42X Average Risk          9.6          7.13X Average Risk          15.0          11.0                      . NonHDL 04/23/2015 172.66   Final   NOTE:  Non-HDL goal should be 30 mg/dL higher than patient's LDL goal (i.e. LDL goal of < 70 mg/dL, would have non-HDL goal of < 100 mg/dL)  . Microalb, Ur 04/23/2015 5.3* 0.0 - 1.9 mg/dL Final  . Creatinine,U 04/23/2015 191.3   Final  . Microalb Creat Ratio 04/23/2015 2.8  0.0 - 30.0 mg/g Final  . WBC 04/23/2015 6.9  4.0 - 10.5 K/uL Final  . RBC 04/23/2015 4.17* 4.22 - 5.81 Mil/uL Final  . Platelets 04/23/2015 264.0  150.0 - 400.0 K/uL Final  . Hemoglobin 04/23/2015 13.3  13.0 - 17.0 g/dL Final  . HCT 04/23/2015 39.6  39.0 - 52.0 % Final  . MCV 04/23/2015 94.8  78.0 - 100.0 fl Final  . MCHC 04/23/2015 33.5  30.0 - 36.0 g/dL Final  . RDW 04/23/2015 13.1  11.5 - 15.5 % Final     Examination:   BP 136/80 mmHg  Pulse 75  Temp(Src) 98 F (36.7 C)  Resp 16  Ht 5' 10.5" (1.791 m)  Wt 220 lb 9.6 oz (100.064 kg)  BMI 31.20 kg/m2  SpO2 95%  Body mass index is 31.2 kg/(m^2).   No edema    ASSESSMENT/ PLAN:    Diabetes type 2 with mild obesity:  Blood glucose control is excellent again now with is not taking prednisone and being consistent with his medication now His blood sugars do not appear to be higher with stopping Januvia and he can stay on metformin by itself along with his Actos and Amaryl Also reminded him to be consistent with diet and exercise especially with adding exercise on weekends  Hypercholesterolemia: LDL is high without any medication and he can start Lipitor 10 mg daily.  Simvastatin potentially can have interaction with Norvasc  Hypertension: Well controlled and will continue same medications  History of vitamin B12 deficiency:  He will continue 1000 g  daily  Madelena Maturin 04/28/2015, 9:01 AM

## 2015-06-19 ENCOUNTER — Other Ambulatory Visit: Payer: Self-pay | Admitting: Endocrinology

## 2015-06-29 ENCOUNTER — Other Ambulatory Visit: Payer: Self-pay | Admitting: Endocrinology

## 2015-06-30 ENCOUNTER — Telehealth: Payer: Self-pay | Admitting: Endocrinology

## 2015-06-30 MED ORDER — HYDROCOD POLST-CPM POLST ER 10-8 MG/5ML PO SUER: ORAL | Status: DC

## 2015-06-30 NOTE — Telephone Encounter (Signed)
Please send prescription for Tussionex 1 teaspoon twice a day as needed, 60 mL

## 2015-06-30 NOTE — Telephone Encounter (Signed)
Need to know specific symptoms and if he is having high fever or a lot of yellow sputum, otherwise he can take OTC medications

## 2015-06-30 NOTE — Telephone Encounter (Signed)
See note below and please advise, Thanks! 

## 2015-06-30 NOTE — Telephone Encounter (Signed)
Pt is stating that he has a cough no fever or sputum but he cough is not going away even with OTC meds please advise

## 2015-06-30 NOTE — Telephone Encounter (Signed)
Rx printed per pt's request. 

## 2015-06-30 NOTE — Telephone Encounter (Signed)
Patient has a cold and ask if you could send him some medication to his pharmacy.

## 2015-07-01 NOTE — Telephone Encounter (Signed)
Called pt and lvm advising him that he needs to pick up the rx for the Tussionex (with hydrocodone) at the office. Advised pt to take 1 tsp twice daily as needed for cough.

## 2015-07-16 ENCOUNTER — Other Ambulatory Visit: Payer: Self-pay | Admitting: Endocrinology

## 2015-07-16 ENCOUNTER — Other Ambulatory Visit: Payer: Self-pay | Admitting: *Deleted

## 2015-07-16 MED ORDER — METFORMIN HCL 1000 MG PO TABS: ORAL_TABLET | ORAL | Status: DC

## 2015-07-16 MED ORDER — GLIMEPIRIDE 2 MG PO TABS
ORAL_TABLET | ORAL | Status: DC
Start: 2015-07-16 — End: 2015-09-24

## 2015-07-21 ENCOUNTER — Telehealth: Payer: Self-pay | Admitting: Endocrinology

## 2015-07-21 NOTE — Telephone Encounter (Signed)
Need to find out what is his symptom and for how long

## 2015-07-21 NOTE — Telephone Encounter (Signed)
lmtcb

## 2015-07-21 NOTE — Telephone Encounter (Signed)
Patient need a refill chlorpheniramine-HYDROcodone (TUSSIONEX PENNKINETIC ,send to [facility] in [place]

## 2015-07-21 NOTE — Telephone Encounter (Signed)
Please see below and advise if okay to send? 

## 2015-07-22 ENCOUNTER — Other Ambulatory Visit: Payer: Self-pay | Admitting: *Deleted

## 2015-07-22 MED ORDER — HYDROCOD POLST-CPM POLST ER 10-8 MG/5ML PO SUER: ORAL | Status: DC

## 2015-07-22 NOTE — Telephone Encounter (Signed)
ok 

## 2015-07-22 NOTE — Telephone Encounter (Signed)
Patient said his cough has been about 3 weeks, very congested. No fever.  Would like the medication below if possible.

## 2015-07-22 NOTE — Telephone Encounter (Signed)
Patient is aware and will come to get it.

## 2015-08-05 ENCOUNTER — Telehealth: Payer: Self-pay | Admitting: Oncology

## 2015-08-05 LAB — HM DIABETES EYE EXAM

## 2015-08-05 NOTE — Telephone Encounter (Signed)
[facility] REGARDING RECORDS ARE READY FOR PICK UP

## 2015-08-26 ENCOUNTER — Other Ambulatory Visit (HOSPITAL_BASED_OUTPATIENT_CLINIC_OR_DEPARTMENT_OTHER): Payer: PRIVATE HEALTH INSURANCE

## 2015-08-26 DIAGNOSIS — C61 Malignant neoplasm of prostate: Secondary | ICD-10-CM

## 2015-08-26 LAB — CBC WITH DIFFERENTIAL/PLATELET
BASO%: 0.8 % (ref 0.0–2.0)
BASOS ABS: 0.1 10*3/uL (ref 0.0–0.1)
EOS ABS: 0.1 10*3/uL (ref 0.0–0.5)
EOS%: 2.1 % (ref 0.0–7.0)
HEMATOCRIT: 38.3 % — AB (ref 38.4–49.9)
HEMOGLOBIN: 12.9 g/dL — AB (ref 13.0–17.1)
LYMPH#: 1.7 10*3/uL (ref 0.9–3.3)
LYMPH%: 26.2 % (ref 14.0–49.0)
MCH: 32.2 pg (ref 27.2–33.4)
MCHC: 33.6 g/dL (ref 32.0–36.0)
MCV: 95.8 fL (ref 79.3–98.0)
MONO#: 0.7 10*3/uL (ref 0.1–0.9)
MONO%: 10.3 % (ref 0.0–14.0)
NEUT%: 60.6 % (ref 39.0–75.0)
NEUTROS ABS: 4 10*3/uL (ref 1.5–6.5)
PLATELETS: 231 10*3/uL (ref 140–400)
RBC: 4 10*6/uL — ABNORMAL LOW (ref 4.20–5.82)
RDW: 12.9 % (ref 11.0–14.6)
WBC: 6.7 10*3/uL (ref 4.0–10.3)

## 2015-08-26 LAB — COMPREHENSIVE METABOLIC PANEL
ALBUMIN: 3.8 g/dL (ref 3.5–5.0)
ALT: 17 U/L (ref 0–55)
AST: 17 U/L (ref 5–34)
Alkaline Phosphatase: 58 U/L (ref 40–150)
Anion Gap: 9 mEq/L (ref 3–11)
BILIRUBIN TOTAL: 0.48 mg/dL (ref 0.20–1.20)
BUN: 16.8 mg/dL (ref 7.0–26.0)
CALCIUM: 9.4 mg/dL (ref 8.4–10.4)
CO2: 27 mEq/L (ref 22–29)
CREATININE: 1.1 mg/dL (ref 0.7–1.3)
Chloride: 103 mEq/L (ref 98–109)
EGFR: 70 mL/min/{1.73_m2} — AB (ref >90)
GLUCOSE: 214 mg/dL — AB (ref 70–140)
Potassium: 4.8 mEq/L (ref 3.5–5.1)
SODIUM: 140 meq/L (ref 136–145)
TOTAL PROTEIN: 6.8 g/dL (ref 6.4–8.3)

## 2015-08-27 LAB — PSA: PSA: 0.04 ng/mL (ref <=4.00)

## 2015-08-28 ENCOUNTER — Ambulatory Visit (HOSPITAL_BASED_OUTPATIENT_CLINIC_OR_DEPARTMENT_OTHER): Payer: PRIVATE HEALTH INSURANCE | Admitting: Oncology

## 2015-08-28 ENCOUNTER — Telehealth: Payer: Self-pay | Admitting: Oncology

## 2015-08-28 ENCOUNTER — Ambulatory Visit (HOSPITAL_BASED_OUTPATIENT_CLINIC_OR_DEPARTMENT_OTHER): Payer: PRIVATE HEALTH INSURANCE

## 2015-08-28 VITALS — BP 135/64 | HR 67 | Temp 97.8°F | Resp 18 | Ht 70.5 in | Wt 223.0 lb

## 2015-08-28 DIAGNOSIS — Z5111 Encounter for antineoplastic chemotherapy: Secondary | ICD-10-CM

## 2015-08-28 DIAGNOSIS — C61 Malignant neoplasm of prostate: Secondary | ICD-10-CM | POA: Diagnosis not present

## 2015-08-28 DIAGNOSIS — E291 Testicular hypofunction: Secondary | ICD-10-CM | POA: Diagnosis not present

## 2015-08-28 DIAGNOSIS — Z8546 Personal history of malignant neoplasm of prostate: Secondary | ICD-10-CM

## 2015-08-28 DIAGNOSIS — E119 Type 2 diabetes mellitus without complications: Secondary | ICD-10-CM

## 2015-08-28 MED ORDER — LEUPROLIDE ACETATE (4 MONTH) 30 MG IM KIT
30.0000 mg | PACK | Freq: Once | INTRAMUSCULAR | Status: AC
  Administered 2015-08-28: 30 mg via INTRAMUSCULAR
  Filled 2015-08-28: qty 30

## 2015-08-28 NOTE — Progress Notes (Signed)
Hematology and Oncology Follow Up Visit  Richard [name] [id number] 1946-01-25 69 y.o. 08/28/2015 8:47 AM    Principle Diagnosis: 69 year old gentleman with prostate cancer diagnosed in December of 2005. He had a Gleason score 4+3 = 7 with a PSA of 3.66  Prior Therapy: 1. He is status post retropubic prostatectomy and bilateral lymph node dissection done on 11/25/2004. The pathology showed a Gleason score of 4+4 equals 8 with staging of T2b N0. His PSA nadir was at 0.12. 2. he is status post salvage radiation therapy done between March and May of 2007 for a rise in his PSA to 0.29. He received total of 65 gray in 35 fractions. 3. he subsequently had another rise in his PSA up to 1.52. He was treated with Lupron intermittently last treatment was in October of 2010 with a PSA nadir down to 0.08. Most recent PSA was up to 8.91.   Current therapy: Intermittent androgen deprivation for biochemically relapsing prostate cancer. Lupron was restarted in April 2016 he is currently receiving 40 mg every 4 months.  Interim History: Richard [name] presents today for a followup visit with his wife. Since his last visit, he is no recent complaints. He continues to tolerate Lupron with very little complications. He did report hot flashes, fatigue and depression which have improved since the last visit. He continues to perform activities of daily living without any hindrance or decline. Is not reporting any constitutional symptoms or musculoskeletal complaints. He has not reported any recent illnesses or hospitalizations. He reports that is diffuse rash have subsided at this time. He reports his blood sugar have been reasonably controlled despite p.m. on hormone therapy.   He has not reported any headaches or blurred vision or double vision. Does not report any fevers or chills or sweats. Does not report any chest pain or shortness of breath. As that report any nausea or vomiting. Did not report any frequency  urgency or hesitancy. Rest of his review of systems unremarkable.  Medications: I have reviewed the patient's current medications.   Allergies:  Allergies  Allergen Reactions  . Effexor Xr [Venlafaxine Hcl] Rash    Past Medical History, Surgical history, Social history, and Family History were reviewed and updated.  Marland Kitchen Physical Exam: Blood pressure 135/64, pulse 67, temperature 97.8 F (36.6 C), temperature source Oral, resp. rate 18, height 5' 10.5" (1.791 m), weight 223 lb (101.152 kg), SpO2 97 %. ECOG: 0 General appearance: alert awake well-appearing gentleman without distress. Head: Normocephalic, without obvious abnormality no oral ulcers or lesions. Neck: no adenopathy Lymph nodes: Cervical, supraclavicular, and axillary nodes normal. Heart:regular rate and rhythm, S1, S2 normal, no murmur, click, rub or gallop Lung:chest clear, no wheezing, rales, normal symmetric air entry Abdomin: soft, non-tender, without masses or organomegaly shifting dullness or ascites. EXT:no erythema, induration, or nodules Skin: No rashes or lesions noted.  Lab Results: Lab Results  Component Value Date   WBC 6.7 08/26/2015   HGB 12.9* 08/26/2015   HCT 38.3* 08/26/2015   MCV 95.8 08/26/2015   PLT 231 08/26/2015     Chemistry      Component Value Date/Time   NA 140 08/26/2015 0817   NA 139 04/23/2015 0836   K 4.8 08/26/2015 0817   K 4.8 04/23/2015 0836   CL 103 04/23/2015 0836   CL 102 02/20/2013 1254   CO2 27 08/26/2015 0817   CO2 29 04/23/2015 0836   BUN 16.8 08/26/2015 0817   BUN 17 04/23/2015 0836   CREATININE  1.1 08/26/2015 0817   CREATININE 0.83 04/23/2015 0836      Component Value Date/Time   CALCIUM 9.4 08/26/2015 0817   CALCIUM 9.4 04/23/2015 0836   ALKPHOS 58 08/26/2015 0817   ALKPHOS 51 04/23/2015 0836   AST 17 08/26/2015 0817   AST 14 04/23/2015 0836   ALT 17 08/26/2015 0817   ALT 15 04/23/2015 0836   BILITOT 0.48 08/26/2015 0817   BILITOT 0.4 04/23/2015 0836       Results for Richard [name] (MRN [name]) as of 08/28/2015 08:49  Ref. Range 12/17/2014 08:20 02/18/2015 08:22 08/26/2015 08:17  PSA Latest Ref Range: <=4.00 ng/mL 8.91 (H) 0.27 0.04      Impression and Plan: 69 year old gentleman with:   1. Prostate cancer. He had a Gleason score of 4+4 equals 8, PSA at the time diagnosed at 3.66. He has biochemically relapsing disease with PSA of  To 8.91. Staging workup including a bone scan and CT scan showed no evidence of disease.  He is currently androgen deprivation only in the form of Lupron that was started in April 2016. His PSA has responded nicely down to 0.04 in August 26, 2015. He has very little complications at this time and the plan is to continue with the same dose and schedule. We will consider intermittent therapy after a total of 18 months of androgen deprivation.  2. Diffuse rash: Resolved at this time. Evidence of recurrence at this time. It is unclear whether was a drug rash or contact dermatitis.  3. Diabetes: Seems to be managed well despite being on hormone therapy.  4. Followup: 4 months for repeat PSA and Lupron injection.    [facility], MD 12/29/20168:47 AM

## 2015-08-28 NOTE — Telephone Encounter (Signed)
Gave patient avs report and appointments for May. Per patient lab scheduled for 5/9 and f/u 5/11 due to he will be out of town the week of 5/2.

## 2015-08-29 ENCOUNTER — Encounter: Payer: PRIVATE HEALTH INSURANCE | Admitting: Endocrinology

## 2015-09-24 ENCOUNTER — Telehealth: Payer: Self-pay | Admitting: Endocrinology

## 2015-09-24 ENCOUNTER — Other Ambulatory Visit: Payer: Self-pay | Admitting: *Deleted

## 2015-09-24 MED ORDER — AMLODIPINE BESYLATE 5 MG PO TABS: ORAL_TABLET | ORAL | Status: DC

## 2015-09-24 MED ORDER — GLUCOSE BLOOD VI STRP: ORAL_STRIP | Status: DC

## 2015-09-24 MED ORDER — PIOGLITAZONE HCL 30 MG PO TABS: ORAL_TABLET | ORAL | Status: DC

## 2015-09-24 MED ORDER — ONETOUCH DELICA LANCETS FINE MISC: Status: DC

## 2015-09-24 MED ORDER — METFORMIN HCL 1000 MG PO TABS: ORAL_TABLET | ORAL | Status: DC

## 2015-09-24 MED ORDER — RAMIPRIL 10 MG PO CAPS: ORAL_CAPSULE | ORAL | Status: DC

## 2015-09-24 MED ORDER — GLIMEPIRIDE 2 MG PO TABS: ORAL_TABLET | ORAL | Status: DC

## 2015-09-24 MED ORDER — ATORVASTATIN CALCIUM 10 MG PO TABS: 10.0000 mg | ORAL_TABLET | Freq: Every day | ORAL | Status: DC

## 2015-09-24 NOTE — Telephone Encounter (Signed)
Patient has changed pharmacy's to [facility] [place]  Phone # [phone] send all medication there.

## 2015-09-24 NOTE — Telephone Encounter (Signed)
All rx's have been sent to the pharmacy listed below.

## 2015-10-07 ENCOUNTER — Encounter: Payer: Self-pay | Admitting: Endocrinology

## 2015-10-17 ENCOUNTER — Other Ambulatory Visit (INDEPENDENT_AMBULATORY_CARE_PROVIDER_SITE_OTHER): Payer: Medicare Other

## 2015-10-17 DIAGNOSIS — E119 Type 2 diabetes mellitus without complications: Secondary | ICD-10-CM | POA: Diagnosis not present

## 2015-10-17 DIAGNOSIS — E785 Hyperlipidemia, unspecified: Secondary | ICD-10-CM

## 2015-10-17 LAB — LIPID PANEL
CHOLESTEROL: 153 mg/dL (ref 0–200)
HDL: 56.3 mg/dL (ref >39.00)
LDL Cholesterol: 68 mg/dL (ref 0–99)
NonHDL: 96.61
Total CHOL/HDL Ratio: 3
Triglycerides: 143 mg/dL (ref 0.0–149.0)
VLDL: 28.6 mg/dL (ref 0.0–40.0)

## 2015-10-17 LAB — COMPREHENSIVE METABOLIC PANEL
ALBUMIN: 4 g/dL (ref 3.5–5.2)
ALK PHOS: 53 U/L (ref 39–117)
ALT: 15 U/L (ref 0–53)
AST: 15 U/L (ref 0–37)
BUN: 14 mg/dL (ref 6–23)
CO2: 30 mEq/L (ref 19–32)
CREATININE: 0.87 mg/dL (ref 0.40–1.50)
Calcium: 9.2 mg/dL (ref 8.4–10.5)
Chloride: 104 mEq/L (ref 96–112)
GFR: 92.01 mL/min (ref >60.00)
GLUCOSE: 125 mg/dL — AB (ref 70–99)
Potassium: 4.7 mEq/L (ref 3.5–5.1)
SODIUM: 139 meq/L (ref 135–145)
TOTAL PROTEIN: 6.4 g/dL (ref 6.0–8.3)
Total Bilirubin: 0.5 mg/dL (ref 0.2–1.2)

## 2015-10-17 LAB — HEMOGLOBIN A1C: HEMOGLOBIN A1C: 6.6 % — AB (ref 4.6–6.5)

## 2015-10-22 ENCOUNTER — Ambulatory Visit (INDEPENDENT_AMBULATORY_CARE_PROVIDER_SITE_OTHER): Payer: Medicare Other | Admitting: Endocrinology

## 2015-10-22 ENCOUNTER — Encounter: Payer: Self-pay | Admitting: Endocrinology

## 2015-10-22 VITALS — BP 128/70 | HR 74 | Temp 98.0°F | Resp 16 | Ht 70.5 in | Wt 222.0 lb

## 2015-10-22 DIAGNOSIS — E1165 Type 2 diabetes mellitus with hyperglycemia: Secondary | ICD-10-CM

## 2015-10-22 DIAGNOSIS — Z8546 Personal history of malignant neoplasm of prostate: Secondary | ICD-10-CM

## 2015-10-22 DIAGNOSIS — Z0000 Encounter for general adult medical examination without abnormal findings: Secondary | ICD-10-CM | POA: Diagnosis not present

## 2015-10-22 DIAGNOSIS — E538 Deficiency of other specified B group vitamins: Secondary | ICD-10-CM | POA: Diagnosis not present

## 2015-10-22 DIAGNOSIS — E785 Hyperlipidemia, unspecified: Secondary | ICD-10-CM | POA: Diagnosis not present

## 2015-10-22 NOTE — Progress Notes (Signed)
Patient ID: Richard [name], male   DOB: [birth year], 70 y.o.   MRN: [id number]   SUBJECTIVE:  Patient is being seen today for Medicare annual wellness visit and review of chronic problems.     Risk factors: Advancing age, long-standing diabetes     Roster of Physicians Providing Medical Care to Patient:  oncologist and myself   Activities of Daily Living:  In the present state of health, the patient has no difficulty performing the following activities:  Preparing food and eating, Bathing, Getting dressed and taking care of daily personal  needs Patient is able to ambulate without feeling unsteady In the past year the patient has not fallen or had a near fall    Safety: Has smoke detector and wears seat belts. No excess sun exposure.  Diet and Exercise  Current exercise habits:  some walking and yard work Dietary issues discussed: heart healthy diet   Depression Screen:  Results available in the depression screening section  Advance directives:  Details as in advance directives section  The following portions of the patient's history were reviewed and updated as appropriate: allergies, current medications, past family history, past medical history, past social history, past surgical history and problem list.  Most recent labs available were reviewed    Medication List       This list is accurate as of: 10/22/15  8:27 PM.  Always use your most recent med list.               amLODipine 5 MG tablet  Commonly known as:  NORVASC  TAKE 1 TABLET (5 MG TOTAL) BY MOUTH DAILY.     atorvastatin 10 MG tablet  Commonly known as:  LIPITOR  Take 1 tablet (10 mg total) by mouth daily.     chlorpheniramine-HYDROcodone 10-8 MG/5ML Suer  Commonly known as:  TUSSIONEX PENNKINETIC ER  1 teaspoon twice a day as needed.     glimepiride 2 MG tablet  Commonly known as:  AMARYL  TAKE 1 TABLET (2 MG TOTAL) BY MOUTH DAILY WITH BREAKFAST.     glucose blood test strip    Commonly known as:  ONE TOUCH ULTRA TEST  Use as instructed to check blood sugar 3 times per week dx code E11.9     metFORMIN 1000 MG tablet  Commonly known as:  GLUCOPHAGE  TAKE 1 TABLET (1,000 MG TOTAL) BY MOUTH 2 (TWO) TIMES DAILY WITH A MEAL.     ONE TOUCH ULTRA 2 w/Device Kit  Use to check blood sugar 3 times per week dx code Z16.9     ONETOUCH DELICA LANCETS FINE Misc  Use to check blood sugar 3 times per week dx code E11.9     pioglitazone 30 MG tablet  Commonly known as:  ACTOS  TAKE 1 TABLET (30 MG TOTAL) BY MOUTH DAILY.     ramipril 10 MG capsule  Commonly known as:  ALTACE  TAKE 1 CAPSULE (10 MG TOTAL) BY MOUTH DAILY.        Allergies:  Allergies  Allergen Reactions  . Effexor Xr [Venlafaxine Hcl] Rash    Past Medical History  Diagnosis Date  . prostate ca dx'd 2006    prostatectomy and xrt  . Hypertension   . Diabetes mellitus without complication ([place])     History reviewed. No pertinent past surgical history.  Family History  Problem Relation Age of Onset  . Hypertension Father   . Heart disease Neg Hx   . Diabetes  Neg Hx     Social History:  reports that he has been smoking.  He has never used smokeless tobacco. His alcohol and drug histories are not on file.   Review of Systems:   Denies hearing loss, and visual loss Does not have any changes in appetite or significant weight change  Objective  BP 128/70 mmHg  Pulse 74  Temp(Src) 98 F (36.7 C)  Resp 16  Ht 5' 10.5" (1.791 m)  Wt 222 lb (100.699 kg)  BMI 31.39 kg/m2  SpO2 97%  Vision:  Normal, has annual eye exams   Hearing: grossly normal Body mass index:  See vitals Neurological/balance: pt easily and quickly performs "get-up-and-go" from a sitting position Cognitive Impairment Assessment: cognition, memory and judgment appear normal.   Assessment  Medicare wellness evaluation done   Preventive parameters reviewed   Plan  During the course of the visit the patient was  educated and counseled about appropriate screening and preventive services including:        Fall prevention   Nutrition counseling Exercise regimen  Lipid screening Colorectal screening Regular eye and dental exams Prophylactic aspirin   Vaccines / LABS Flu vaccine/Zostavax / Pneumococcal Vaccine/Prevnar DTaP  up-to-date   Patient Instructions (the written plan) was given to the patient.    [facility] 10/22/2015   OFFICE visit for management of various problems:   DIABETES MELITUS, date of diagnosis 1995 with a glucose of 320 and HemoglobinA1c of 9.6  Previous history: He has been on various oral hypoglycemic drugs in the past but for the last few years has required multiple drugs in combination; diabetes has been generally well controlled with A1c near normal  Recent history:  Oral hypoglycemic drugs: Amaryl 2 mg once a day in a.m., metformin 1000 twice a day, Actos 15 mg daily  Because of his potential for allergic reactions to medications and also out-of-pocket expense his Janumet was changed to metformin alone A1c is relatively higher now  He has not been consistent over the last few months with diet but is trying to improve this in the last month or so He has been checking his blood sugars periodically and he seemed to be high after meals especially breakfast and lunch For breakfast he will eat oatmeal or a bowl of cereal with some fruit Generally eating some bread with every meal He is generally walking more this year because of retiring but also trying to be more active with yard work and general Blood sugars may be low normal before supper in the 70s Lab glucose was 125 fasting     Side effects from medications: None       Monitors blood glucose:  irregularly     Glucometer: One Touch.          Blood Glucose readings:   readings before breakfast not checked He has checked blood sugars only on a few days but several times  After breakfast: 246 After lunch: 75,  196 After dinner 90-164 Before dinner: 91-216   Hypoglycemia:  none         Meals: 3 meals per day, usually watching portions.  breakfast is oatmeal, cereal         Physical activity: exercise: Some walking and outside activities Dietician visit: Most recent:? 2007   Weight control:      Wt Readings from Last 3 Encounters:  10/22/15 222 lb (100.699 kg)  08/28/15 223 lb (101.152 kg)  04/28/15 220 lb 9.6 oz (100.064 kg)  Diabetes labs:  Lab Results  Component Value Date   HGBA1C 6.6* 10/17/2015   HGBA1C 6.2 04/23/2015   HGBA1C 7.1* 12/24/2014   Lab Results  Component Value Date   MICROALBUR 5.3* 04/23/2015   LDLCALC 68 10/17/2015   CREATININE 0.87 10/17/2015     Lab on 10/17/2015  Component Date Value Ref Range Status  . Hgb A1c MFr Bld 10/17/2015 6.6* 4.6 - 6.5 % Final   Glycemic Control Guidelines for People with Diabetes:Non Diabetic:  <6%Goal of Therapy: <7%Additional Action Suggested:  >8%   . Sodium 10/17/2015 139  135 - 145 mEq/L Final  . Potassium 10/17/2015 4.7  3.5 - 5.1 mEq/L Final  . Chloride 10/17/2015 104  96 - 112 mEq/L Final  . CO2 10/17/2015 30  19 - 32 mEq/L Final  . Glucose, Bld 10/17/2015 125* 70 - 99 mg/dL Final  . BUN 10/17/2015 14  6 - 23 mg/dL Final  . Creatinine, Ser 10/17/2015 0.87  0.40 - 1.50 mg/dL Final  . Total Bilirubin 10/17/2015 0.5  0.2 - 1.2 mg/dL Final  . Alkaline Phosphatase 10/17/2015 53  39 - 117 U/L Final  . AST 10/17/2015 15  0 - 37 U/L Final  . ALT 10/17/2015 15  0 - 53 U/L Final  . Total Protein 10/17/2015 6.4  6.0 - 8.3 g/dL Final  . Albumin 10/17/2015 4.0  3.5 - 5.2 g/dL Final  . Calcium 10/17/2015 9.2  8.4 - 10.5 mg/dL Final  . GFR 10/17/2015 92.01  >60.00 mL/min Final  . Cholesterol 10/17/2015 153  0 - 200 mg/dL Final   ATP III Classification       Desirable:  < 200 mg/dL               Borderline High:  200 - 239 mg/dL          High:  > = 240 mg/dL  . Triglycerides 10/17/2015 143.0  0.0 - 149.0 mg/dL Final    Normal:  <150 mg/dLBorderline High:  150 - 199 mg/dL  . HDL 10/17/2015 56.30  >39.00 mg/dL Final  . VLDL 10/17/2015 28.6  0.0 - 40.0 mg/dL Final  . LDL Cholesterol 10/17/2015 68  0 - 99 mg/dL Final  . Total CHOL/HDL Ratio 10/17/2015 3   Final                  Men          Women1/2 Average Risk     3.4          3.3Average Risk          5.0          4.42X Average Risk          9.6          7.13X Average Risk          15.0          11.0                      . NonHDL 10/17/2015 96.61   Final   NOTE:  Non-HDL goal should be 30 mg/dL higher than patient's LDL goal (i.e. LDL goal of < 70 mg/dL, would have non-HDL goal of < 100 mg/dL)           Medication List       This list is accurate as of: 10/22/15  8:26 PM.  Always use your most recent med list.  amLODipine 5 MG tablet  Commonly known as:  NORVASC  TAKE 1 TABLET (5 MG TOTAL) BY MOUTH DAILY.     atorvastatin 10 MG tablet  Commonly known as:  LIPITOR  Take 1 tablet (10 mg total) by mouth daily.     chlorpheniramine-HYDROcodone 10-8 MG/5ML Suer  Commonly known as:  TUSSIONEX PENNKINETIC ER  1 teaspoon twice a day as needed.     glimepiride 2 MG tablet  Commonly known as:  AMARYL  TAKE 1 TABLET (2 MG TOTAL) BY MOUTH DAILY WITH BREAKFAST.     glucose blood test strip  Commonly known as:  ONE TOUCH ULTRA TEST  Use as instructed to check blood sugar 3 times per week dx code E11.9     metFORMIN 1000 MG tablet  Commonly known as:  GLUCOPHAGE  TAKE 1 TABLET (1,000 MG TOTAL) BY MOUTH 2 (TWO) TIMES DAILY WITH A MEAL.     ONE TOUCH ULTRA 2 w/Device Kit  Use to check blood sugar 3 times per week dx code Z00.1     ONETOUCH DELICA LANCETS FINE Misc  Use to check blood sugar 3 times per week dx code E11.9     pioglitazone 30 MG tablet  Commonly known as:  ACTOS  TAKE 1 TABLET (30 MG TOTAL) BY MOUTH DAILY.     ramipril 10 MG capsule  Commonly known as:  ALTACE  TAKE 1 CAPSULE (10 MG TOTAL) BY MOUTH DAILY.         Allergies:  Allergies  Allergen Reactions  . Effexor Xr [Venlafaxine Hcl] Rash    Past Medical History  Diagnosis Date  . prostate ca dx'd 2006    prostatectomy and xrt  . Hypertension   . Diabetes mellitus without complication ([place])     History reviewed. No pertinent past surgical history.  Family History  Problem Relation Age of Onset  . Hypertension Father   . Heart disease Neg Hx   . Diabetes Neg Hx     Social History:  reports that he has been smoking.  He has never used smokeless tobacco. His alcohol and drug histories are not on file.    REVIEW OF SYSTEMS:       Eye exam last  12/6. Has history of cataract surgery  Hypertension:  blood pressure has been very well controlled with 10 mg ramipril, amlodipine 5 mg. Does monitor at home 120-140/70-85  Lipids:  he has been compliant with his simvastatin 40 mg LDL at goal and HDL/triglycerides are normal  Lab Results  Component Value Date   CHOL 153 10/17/2015   HDL 56.30 10/17/2015   LDLCALC 68 10/17/2015   TRIG 143.0 10/17/2015   CHOLHDL 3 10/17/2015        No unusual headaches.       Skin: No rash or infections, did have rash last year evaluated by dermatologist and no etiology found, treated with steroid creams     Thyroid:  No unusual fatigue.     No swelling of feet.     No shortness of breath or chest pain on exertion.     No abdominal pain.  Bowel habits:  No change.        Has some frequency of urination. Has nocturia 3 times and occasionally has mild incontinence      Prostate cancer: This is followed by oncologist; PSA has been improved with Lupron injections which he will continue through this year      History of erectile dysfunction: This  is chronic and has not had much response with using various drugs including Cialis 20 mg.  Does not see urologist now      No joint  Pains.      No numbness or tingling in his feet.  History of vitamin B12 deficiency in the past causing anemia but  is not taking any supplements now    LABS:  Lab on 10/17/2015  Component Date Value Ref Range Status  . Hgb A1c MFr Bld 10/17/2015 6.6* 4.6 - 6.5 % Final   Glycemic Control Guidelines for People with Diabetes:Non Diabetic:  <6%Goal of Therapy: <7%Additional Action Suggested:  >8%   . Sodium 10/17/2015 139  135 - 145 mEq/L Final  . Potassium 10/17/2015 4.7  3.5 - 5.1 mEq/L Final  . Chloride 10/17/2015 104  96 - 112 mEq/L Final  . CO2 10/17/2015 30  19 - 32 mEq/L Final  . Glucose, Bld 10/17/2015 125* 70 - 99 mg/dL Final  . BUN 10/17/2015 14  6 - 23 mg/dL Final  . Creatinine, Ser 10/17/2015 0.87  0.40 - 1.50 mg/dL Final  . Total Bilirubin 10/17/2015 0.5  0.2 - 1.2 mg/dL Final  . Alkaline Phosphatase 10/17/2015 53  39 - 117 U/L Final  . AST 10/17/2015 15  0 - 37 U/L Final  . ALT 10/17/2015 15  0 - 53 U/L Final  . Total Protein 10/17/2015 6.4  6.0 - 8.3 g/dL Final  . Albumin 10/17/2015 4.0  3.5 - 5.2 g/dL Final  . Calcium 10/17/2015 9.2  8.4 - 10.5 mg/dL Final  . GFR 10/17/2015 92.01  >60.00 mL/min Final  . Cholesterol 10/17/2015 153  0 - 200 mg/dL Final   ATP III Classification       Desirable:  < 200 mg/dL               Borderline High:  200 - 239 mg/dL          High:  > = 240 mg/dL  . Triglycerides 10/17/2015 143.0  0.0 - 149.0 mg/dL Final   Normal:  <150 mg/dLBorderline High:  150 - 199 mg/dL  . HDL 10/17/2015 56.30  >39.00 mg/dL Final  . VLDL 10/17/2015 28.6  0.0 - 40.0 mg/dL Final  . LDL Cholesterol 10/17/2015 68  0 - 99 mg/dL Final  . Total CHOL/HDL Ratio 10/17/2015 3   Final                  Men          Women1/2 Average Risk     3.4          3.3Average Risk          5.0          4.42X Average Risk          9.6          7.13X Average Risk          15.0          11.0                      . NonHDL 10/17/2015 96.61   Final   NOTE:  Non-HDL goal should be 30 mg/dL higher than patient's LDL goal (i.e. LDL goal of < 70 mg/dL, would have non-HDL goal of < 100 mg/dL)     Examination:    BP 128/70 mmHg  Pulse 74  Temp(Src) 98 F (36.7 C)  Resp 16  Ht 5' 10.5" (  1.791 m)  Wt 222 lb (100.699 kg)  BMI 31.39 kg/m2  SpO2 97%  Body mass index is 31.39 kg/(m^2).     GENERAL: Mild generalized obesity present.   No pallor, clubbing, cervical lymphadenopathy or edema.   Skin:  no rash or pigmentation.  EYES:  Externally normal.  Fundii:  normal discs and vessels.  ENT: Oral mucosa, pharynx and tongue normal.  THYROID:  Not palpable.  CAROTIDS:  Normal character; no bruit.  HEART:  Normal  S1 and S2; no murmur or click.  CHEST:  Normal shape.  Lungs: Vescicular breath sounds heard equally.  No crepitations/ wheeze.  ABDOMEN:  No distention.  Liver and spleen not palpable.  No other mass or tenderness.   Rectal exam shows no prostate enlargement and no mass palpable  NEUROLOGICAL: .Diabetic foot exam shows normal monofilament sensation in the toes and plantar surfaces, no skin lesions or ulcers on the feet and normal pedal pulses Reflexes are absent bilaterally at ankles. Vibration sense is significantly reduced in the toes  JOINTS:  Normal peripheral joints and knee joints.   ASSESSMENT/ PLAN:      Diabetes type 2 with obesity:  Blood glucose control appears overall fair with A1c 6.6, higher than usual He has some postprandial hyperglycemia especially when he has more carbohydrate or unbalanced meals Discussed that he will do better with adding Januvia back and he can check on the coverage of this and other drugs in the same group. Meanwhile he will use up to [place] he has at home. Encouraged him to be consistent with exercise and diet  No significant diabetes complications evident  Hypertension: Well controlled  He will continue same medications, encouraged him to check periodically at home  Hypercholesterolemia: Well controlled on simvastatin  Stool Hemoccult was done in 10/16, he will request records from the [facility]  History of vitamin  B12 deficiency: Will recheck B12 level on the next visit    Patient Instructions  Have a protein with breakfast daily  Check blood sugars on waking up 2  times a week Also check blood sugars about 2 hours after a meal and do this after different meals by rotation  Recommended blood sugar levels on waking up is 90-130 and about 2 hours after meal is 130-160  Please bring your blood sugar monitor to each visit, thank you  With janumet cut Glimeperide in 1/2  Check on the comparative cost of Januvia, Onglyza, Tradjenta and alogliptin  Coated baby aspirin 81 mg mouth once daily with food  Continue regular eye and dental exams Wears seat belts all the time Make sure smoke detectors are working at home     [facility] 10/22/2015, 8:26 PM

## 2015-10-22 NOTE — Patient Instructions (Addendum)
Have a protein with breakfast daily  Check blood sugars on waking up 2  times a week Also check blood sugars about 2 hours after a meal and do this after different meals by rotation  Recommended blood sugar levels on waking up is 90-130 and about 2 hours after meal is 130-160  Please bring your blood sugar monitor to each visit, thank you  With janumet cut Glimeperide in 1/2  Check on the comparative cost of Januvia, Onglyza, Tradjenta and alogliptin  Coated baby aspirin 81 mg mouth once daily with food  Continue regular eye and dental exams Wears seat belts all the time Make sure smoke detectors are working at home

## 2015-10-27 DIAGNOSIS — H903 Sensorineural hearing loss, bilateral: Secondary | ICD-10-CM | POA: Diagnosis not present

## 2015-10-27 DIAGNOSIS — H9123 Sudden idiopathic hearing loss, bilateral: Secondary | ICD-10-CM | POA: Diagnosis not present

## 2015-10-27 DIAGNOSIS — R42 Dizziness and giddiness: Secondary | ICD-10-CM | POA: Diagnosis not present

## 2015-10-28 ENCOUNTER — Telehealth: Payer: Self-pay | Admitting: *Deleted

## 2015-10-28 NOTE — Telephone Encounter (Signed)
FYI "This is [name] calling for my hisband Richard [name].  Sunday he immediately had hearing loss to his left ear.  He rececives Lupron injections every four months.  Is this a side effect?"  Consulted with [facility]'s Melissa RPh.  "No hearing loss has been reported with this drug."  [name] notified.  No further questions.

## 2015-11-03 DIAGNOSIS — H903 Sensorineural hearing loss, bilateral: Secondary | ICD-10-CM | POA: Diagnosis not present

## 2015-11-03 DIAGNOSIS — H9122 Sudden idiopathic hearing loss, left ear: Secondary | ICD-10-CM | POA: Diagnosis not present

## 2015-11-03 DIAGNOSIS — H9123 Sudden idiopathic hearing loss, bilateral: Secondary | ICD-10-CM | POA: Diagnosis not present

## 2015-11-10 DIAGNOSIS — H903 Sensorineural hearing loss, bilateral: Secondary | ICD-10-CM | POA: Diagnosis not present

## 2015-11-10 DIAGNOSIS — H9122 Sudden idiopathic hearing loss, left ear: Secondary | ICD-10-CM | POA: Diagnosis not present

## 2015-11-10 DIAGNOSIS — H9123 Sudden idiopathic hearing loss, bilateral: Secondary | ICD-10-CM | POA: Diagnosis not present

## 2015-12-03 DIAGNOSIS — H9122 Sudden idiopathic hearing loss, left ear: Secondary | ICD-10-CM | POA: Diagnosis not present

## 2015-12-03 DIAGNOSIS — H903 Sensorineural hearing loss, bilateral: Secondary | ICD-10-CM | POA: Diagnosis not present

## 2016-01-06 ENCOUNTER — Other Ambulatory Visit (HOSPITAL_BASED_OUTPATIENT_CLINIC_OR_DEPARTMENT_OTHER): Payer: Medicare Other

## 2016-01-06 DIAGNOSIS — C61 Malignant neoplasm of prostate: Secondary | ICD-10-CM | POA: Diagnosis not present

## 2016-01-06 DIAGNOSIS — E291 Testicular hypofunction: Secondary | ICD-10-CM | POA: Diagnosis not present

## 2016-01-06 LAB — CBC WITH DIFFERENTIAL/PLATELET
BASO%: 1.2 % (ref 0.0–2.0)
BASOS ABS: 0.1 10*3/uL (ref 0.0–0.1)
EOS%: 1.3 % (ref 0.0–7.0)
Eosinophils Absolute: 0.1 10*3/uL (ref 0.0–0.5)
HEMATOCRIT: 36 % — AB (ref 38.4–49.9)
HEMOGLOBIN: 11.7 g/dL — AB (ref 13.0–17.1)
LYMPH#: 1.6 10*3/uL (ref 0.9–3.3)
LYMPH%: 23.3 % (ref 14.0–49.0)
MCH: 31.4 pg (ref 27.2–33.4)
MCHC: 32.6 g/dL (ref 32.0–36.0)
MCV: 96.3 fL (ref 79.3–98.0)
MONO#: 0.7 10*3/uL (ref 0.1–0.9)
MONO%: 9.6 % (ref 0.0–14.0)
NEUT#: 4.5 10*3/uL (ref 1.5–6.5)
NEUT%: 64.6 % (ref 39.0–75.0)
PLATELETS: 251 10*3/uL (ref 140–400)
RBC: 3.74 10*6/uL — ABNORMAL LOW (ref 4.20–5.82)
RDW: 13.5 % (ref 11.0–14.6)
WBC: 6.9 10*3/uL (ref 4.0–10.3)

## 2016-01-06 LAB — COMPREHENSIVE METABOLIC PANEL
ALBUMIN: 4 g/dL (ref 3.5–5.0)
ALK PHOS: 50 U/L (ref 40–150)
ALT: 15 U/L (ref 0–55)
ANION GAP: 8 meq/L (ref 3–11)
AST: 16 U/L (ref 5–34)
BILIRUBIN TOTAL: 0.55 mg/dL (ref 0.20–1.20)
BUN: 15.7 mg/dL (ref 7.0–26.0)
CALCIUM: 9.6 mg/dL (ref 8.4–10.4)
CO2: 27 mEq/L (ref 22–29)
Chloride: 105 mEq/L (ref 98–109)
Creatinine: 0.9 mg/dL (ref 0.7–1.3)
EGFR: 86 mL/min/{1.73_m2} — ABNORMAL LOW (ref >90)
Glucose: 98 mg/dl (ref 70–140)
Potassium: 4.6 mEq/L (ref 3.5–5.1)
Sodium: 140 mEq/L (ref 136–145)
Total Protein: 6.7 g/dL (ref 6.4–8.3)

## 2016-01-07 LAB — PSA: Prostate Specific Ag, Serum: <0.1 ng/mL (ref 0.0–4.0)

## 2016-01-08 ENCOUNTER — Ambulatory Visit (HOSPITAL_BASED_OUTPATIENT_CLINIC_OR_DEPARTMENT_OTHER): Payer: Medicare Other

## 2016-01-08 ENCOUNTER — Ambulatory Visit (HOSPITAL_BASED_OUTPATIENT_CLINIC_OR_DEPARTMENT_OTHER): Payer: Medicare Other | Admitting: Oncology

## 2016-01-08 ENCOUNTER — Telehealth: Payer: Self-pay | Admitting: Oncology

## 2016-01-08 VITALS — BP 116/76 | HR 67 | Temp 97.9°F | Resp 18 | Ht 70.5 in | Wt 205.0 lb

## 2016-01-08 DIAGNOSIS — E291 Testicular hypofunction: Secondary | ICD-10-CM | POA: Diagnosis not present

## 2016-01-08 DIAGNOSIS — C61 Malignant neoplasm of prostate: Secondary | ICD-10-CM | POA: Diagnosis not present

## 2016-01-08 DIAGNOSIS — Z8546 Personal history of malignant neoplasm of prostate: Secondary | ICD-10-CM

## 2016-01-08 DIAGNOSIS — Z5111 Encounter for antineoplastic chemotherapy: Secondary | ICD-10-CM | POA: Diagnosis present

## 2016-01-08 MED ORDER — LEUPROLIDE ACETATE (4 MONTH) 30 MG IM KIT
30.0000 mg | PACK | Freq: Once | INTRAMUSCULAR | Status: AC
  Administered 2016-01-08: 30 mg via INTRAMUSCULAR
  Filled 2016-01-08: qty 30

## 2016-01-08 NOTE — Progress Notes (Signed)
Hematology and Oncology Follow Up Visit  Richard [name] [name] [birth year] 70 y.o. 01/08/2016 9:48 AM    Principle Diagnosis: 70 year old gentleman with prostate cancer diagnosed in December of 2005. He had a Gleason score 4+3 = 7 with a PSA of 3.66  Prior Therapy: 1. He is status post retropubic prostatectomy and bilateral lymph node dissection done on 11/25/2004. The pathology showed a Gleason score of 4+4 equals 8 with staging of T2b N0. His PSA nadir was at 0.12. 2. he is status post salvage radiation therapy done between March and May of 2007 for a rise in his PSA to 0.29. He received total of 65 gray in 35 fractions. 3. he subsequently had another rise in his PSA up to 1.52. He was treated with Lupron intermittently last treatment was in October of 2010 with a PSA nadir down to 0.08. Most recent PSA was up to 8.91.   Current therapy: Intermittent androgen deprivation for biochemically relapsing prostate cancer. Lupron was restarted in April 2016 he is currently receiving 40 mg every 4 months.  Interim History: Richard [name] presents today for a followup visit with his wife. 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 The etiology of hearing loss is unclear at this time. He is following up with ENT and does have a hearing aid on the right side. He denied any neurological deficits or trauma. He denied any recent colds or flu symptoms. He currently retired from his job and performs activities of daily living.  He continues to be on Lupron without any major complications. He did report hot flashes, fatigue but most of side effects have subsided at this time. He does not report any new complaints.   He is not reporting any headaches, blurry vision or syncope or seizures. He is not reporting any chest pain or shortness of breath is not reporting any back pain is not reporting any genitourinary complaints. Does not report any  fevers or chills or sweats. Does not report any chest pain or shortness of breath. As that report any nausea or vomiting. Did not report any frequency urgency or hesitancy. Rest of his review of systems unremarkable.  Medications: I have reviewed the patient's current medications.   Allergies:  Allergies  Allergen Reactions  . Effexor Xr [Venlafaxine Hcl] Rash    Past Medical History, Surgical history, Social history, and Family History were reviewed and updated.  Marland Kitchen Physical Exam: Blood pressure 116/76, pulse 67, temperature 97.9 F (36.6 C), temperature source Oral, resp. rate 18, height 5' 10.5" (1.791 m), weight 205 lb (92.987 kg), SpO2 100 %. ECOG: 0 General appearance: Pleasant-appearing gentleman without distress. Head: Normocephalic, without obvious abnormality no oral ulcers or lesions. Neck: no adenopathy Lymph nodes: Cervical, supraclavicular, and axillary nodes normal. Heart:regular rate and rhythm, S1, S2 normal, no murmur, click, rub or gallop Lung:chest clear, no wheezing, rales, normal symmetric air entry Abdomin: soft, non-tender, without masses or organomegaly no rebound or guarding. EXT:no erythema, induration, or nodules Skin: No rashes or lesions noted.  Lab Results: Lab Results  Component Value Date   WBC 6.9 01/06/2016   HGB 11.7* 01/06/2016   HCT 36.0* 01/06/2016   MCV 96.3 01/06/2016   PLT 251 01/06/2016     Chemistry      Component Value Date/Time   NA 140 01/06/2016 1117   NA 139 10/17/2015 0907   K 4.6 01/06/2016 1117   K 4.7 10/17/2015 [id number]  CL 104 10/17/2015 0907   CL 102 02/20/2013 1254   CO2 27 01/06/2016 1117   CO2 30 10/17/2015 0907   BUN 15.7 01/06/2016 1117   BUN 14 10/17/2015 0907   CREATININE 0.9 01/06/2016 1117   CREATININE 0.87 10/17/2015 0907      Component Value Date/Time   CALCIUM 9.6 01/06/2016 1117   CALCIUM 9.2 10/17/2015 0907   ALKPHOS 50 01/06/2016 1117   ALKPHOS 53 10/17/2015 0907   AST 16 01/06/2016 1117   AST  15 10/17/2015 0907   ALT 15 01/06/2016 1117   ALT 15 10/17/2015 0907   BILITOT 0.55 01/06/2016 1117   BILITOT 0.5 10/17/2015 0907       Results for [name], [name] (MRN [id number]) as of 01/08/2016 09:27  Ref. Range 08/26/2015 08:17 01/06/2016 11:17  PSA Latest Ref Range: 0.0-4.0 ng/mL 0.04 <0.1     Impression and Plan: 70 year old gentleman with:   1. Prostate cancer. He had a Gleason score of 4+4 equals 8, PSA at the time diagnosed at 3.66. He has biochemically relapsing disease with PSA of  To 8.91. Staging workup including a bone scan and CT scan showed no evidence of disease.  He is currently on area deprivation only in the form of Lupron that was started in April 2016. His PSA continues to be under excellent control and have tolerated androgen deprivation well. The plan is to continue the same dose and schedule and consider intermittent therapy by the end of 2017.  2. Hearing loss: Unclear etiology at this time do not think this is related to Lupron.  3. Followup: In 4 months for his next Lupron injection.    [facility], MD 5/11/20179:48 AM

## 2016-01-08 NOTE — Telephone Encounter (Signed)
Gave and printed appt sched and avs for pt for Sept °

## 2016-01-19 ENCOUNTER — Other Ambulatory Visit: Payer: PRIVATE HEALTH INSURANCE

## 2016-01-23 ENCOUNTER — Ambulatory Visit: Payer: PRIVATE HEALTH INSURANCE | Admitting: Endocrinology

## 2016-02-23 ENCOUNTER — Other Ambulatory Visit (INDEPENDENT_AMBULATORY_CARE_PROVIDER_SITE_OTHER): Payer: Medicare Other

## 2016-02-23 DIAGNOSIS — E538 Deficiency of other specified B group vitamins: Secondary | ICD-10-CM | POA: Diagnosis not present

## 2016-02-23 DIAGNOSIS — E1165 Type 2 diabetes mellitus with hyperglycemia: Secondary | ICD-10-CM

## 2016-02-23 LAB — BASIC METABOLIC PANEL
BUN: 16 mg/dL (ref 6–23)
CO2: 29 meq/L (ref 19–32)
Calcium: 9.4 mg/dL (ref 8.4–10.5)
Chloride: 104 mEq/L (ref 96–112)
Creatinine, Ser: 0.95 mg/dL (ref 0.40–1.50)
GFR: 83.04 mL/min (ref >60.00)
GLUCOSE: 114 mg/dL — AB (ref 70–99)
POTASSIUM: 5.2 meq/L — AB (ref 3.5–5.1)
SODIUM: 140 meq/L (ref 135–145)

## 2016-02-23 LAB — VITAMIN B12: Vitamin B-12: 103 pg/mL — ABNORMAL LOW (ref 211–911)

## 2016-02-23 LAB — HEMOGLOBIN A1C: HEMOGLOBIN A1C: 5.7 % (ref 4.6–6.5)

## 2016-02-26 ENCOUNTER — Ambulatory Visit (INDEPENDENT_AMBULATORY_CARE_PROVIDER_SITE_OTHER): Payer: Medicare Other | Admitting: Endocrinology

## 2016-02-26 ENCOUNTER — Encounter: Payer: Self-pay | Admitting: Endocrinology

## 2016-02-26 ENCOUNTER — Telehealth: Payer: Self-pay | Admitting: Endocrinology

## 2016-02-26 VITALS — BP 136/72 | HR 66 | Ht 70.5 in | Wt 207.0 lb

## 2016-02-26 DIAGNOSIS — E785 Hyperlipidemia, unspecified: Secondary | ICD-10-CM | POA: Diagnosis not present

## 2016-02-26 DIAGNOSIS — E119 Type 2 diabetes mellitus without complications: Secondary | ICD-10-CM | POA: Diagnosis not present

## 2016-02-26 DIAGNOSIS — H8102 Meniere's disease, left ear: Secondary | ICD-10-CM | POA: Diagnosis not present

## 2016-02-26 DIAGNOSIS — H9312 Tinnitus, left ear: Secondary | ICD-10-CM | POA: Diagnosis not present

## 2016-02-26 DIAGNOSIS — E538 Deficiency of other specified B group vitamins: Secondary | ICD-10-CM | POA: Diagnosis not present

## 2016-02-26 DIAGNOSIS — H903 Sensorineural hearing loss, bilateral: Secondary | ICD-10-CM | POA: Diagnosis not present

## 2016-02-26 MED ORDER — RAMIPRIL 5 MG PO CAPS: ORAL_CAPSULE | ORAL | Status: DC

## 2016-02-26 NOTE — Telephone Encounter (Signed)
Patient is returning your call.  

## 2016-02-26 NOTE — Telephone Encounter (Signed)
Left a vm advising of note below. Requested a call back if the pt would like to discuss.  

## 2016-02-26 NOTE — Progress Notes (Signed)
Patient ID: Richard [name], male   DOB: 02-09-1946, 70 y.o.   MRN: [id number]   Reason for Appointment:  Follow-up  History of Present Illness   Problem 1:  DIABETES MELITUS, date of diagnosis 1995 with a glucose of 320 and HemoglobinA1c of 9.6 Previous history: He has been on various oral hypoglycemic drugs in the past but for the last few years has required multiple drugs in combination; diabetes has been generally well controlled with A1c near normal  Recent history:   Oral hypoglycemic drugs: Amaryl 2 mg once a day in a.m., metformin 1000 twice a day, Actos 15 mg daily  Current management, blood sugar patterns and problems identified:  His A1c down to 5.7 which is better than usual  This is despite his not taking Januvia which he was finding expensive  He has lost a considerable amount of weight and this happen mostly when he was having significant anxiety and depression earlier this year but has been able to keep it off  He is trying to walk at least every other day with his wife now  He checks his blood sugars very sporadically and relatively higher only once, however readings on his meter are dated April Lab glucose was 114 fasting     Side effects from medications: None       Monitors blood glucose:  irregularly     Glucometer: One Touch.          Blood Glucose readings: Checked in April only, range 100-171 with only one high reading and average 129 Checking mostly between breakfast and lunch   Hypoglycemia:  none         Meals: 3 meals per day, usually watching portions.           Physical activity: exercise: qod 30 min  Dietician visit: Most recent:? 2007   Weight control:      Wt Readings from Last 3 Encounters:  02/26/16 207 lb (93.895 kg)  01/08/16 205 lb (92.987 kg)  10/22/15 222 lb (100.699 kg)    Diabetes labs:  Lab Results  Component Value Date   HGBA1C 5.7 02/23/2016   HGBA1C 6.6* 10/17/2015   HGBA1C 6.2 04/23/2015   Lab Results    Component Value Date   MICROALBUR 5.3* 04/23/2015   LDLCALC 68 10/17/2015   CREATININE 0.95 02/23/2016       Medication List       This list is accurate as of: 02/26/16 10:02 AM.  Always use your most recent med list.               amLODipine 5 MG tablet  Commonly known as:  NORVASC  TAKE 1 TABLET (5 MG TOTAL) BY MOUTH DAILY.     atorvastatin 10 MG tablet  Commonly known as:  LIPITOR  Take 1 tablet (10 mg total) by mouth daily.     chlorpheniramine-HYDROcodone 10-8 MG/5ML Suer  Commonly known as:  TUSSIONEX PENNKINETIC ER  1 teaspoon twice a day as needed.     escitalopram 10 MG tablet  Commonly known as:  LEXAPRO  Take 10 mg by mouth daily.     glimepiride 2 MG tablet  Commonly known as:  AMARYL  TAKE 1 TABLET (2 MG TOTAL) BY MOUTH DAILY WITH BREAKFAST.     glucose blood test strip  Commonly known as:  ONE TOUCH ULTRA TEST  Use as instructed to check blood sugar 3 times per week dx code E11.9     LUPRON  DEPOT (7-MONTH) IM  Inject into the muscle.     metFORMIN 1000 MG tablet  Commonly known as:  GLUCOPHAGE  TAKE 1 TABLET (1,000 MG TOTAL) BY MOUTH 2 (TWO) TIMES DAILY WITH A MEAL.     ONE TOUCH ULTRA 2 w/Device Kit  Use to check blood sugar 3 times per week dx code Z99.3     ONETOUCH DELICA LANCETS FINE Misc  Use to check blood sugar 3 times per week dx code E11.9     pioglitazone 30 MG tablet  Commonly known as:  ACTOS  TAKE 1 TABLET (30 MG TOTAL) BY MOUTH DAILY.     predniSONE 10 MG (48) Tbpk tablet  Commonly known as:  STERAPRED UNI-PAK 48 TAB  Reported on 02/26/2016     ramipril 10 MG capsule  Commonly known as:  ALTACE  TAKE 1 CAPSULE (10 MG TOTAL) BY MOUTH DAILY.        Allergies:  Allergies  Allergen Reactions  . Effexor Xr [Venlafaxine Hcl] Rash    Past Medical History  Diagnosis Date  . prostate ca dx'd 2006    prostatectomy and xrt  . Hypertension   . Diabetes mellitus without complication ([place])     No past surgical history  on file.  Family History  Problem Relation Age of Onset  . Hypertension Father   . Heart disease Neg Hx   . Diabetes Neg Hx     Social History:  reports that he has been smoking.  He has never used smokeless tobacco. His alcohol and drug histories are not on file.    REVIEW OF SYSTEMS:       Hypertension:  blood pressure has been  well controlled with 10 mg ramipril, amlodipine 5 mg.  Does  monitor at home occasionally, usually well-controlled  Lipids:  he has been taking Lipitor now, previously on simvastatin LDL is controlled as of 2/17   Lab Results  Component Value Date   CHOL 153 10/17/2015   HDL 56.30 10/17/2015   LDLCALC 68 10/17/2015   TRIG 143.0 10/17/2015   CHOLHDL 3 10/17/2015     History of vitamin B12 deficiency in the past causing anemia.  Has been asked to restart supplements But he is still not doing so He is slightly anemic and B12 is again low    He is taking Lexapro for depression  He had hearing loss and was given prednisone for this  LABS:  Lab on 02/23/2016  Component Date Value Ref Range Status  . Hgb A1c MFr Bld 02/23/2016 5.7  4.6 - 6.5 % Final   Glycemic Control Guidelines for People with Diabetes:Non Diabetic:  <6%Goal of Therapy: <7%Additional Action Suggested:  >8%   . Sodium 02/23/2016 140  135 - 145 mEq/L Final  . Potassium 02/23/2016 5.2* 3.5 - 5.1 mEq/L Final  . Chloride 02/23/2016 104  96 - 112 mEq/L Final  . CO2 02/23/2016 29  19 - 32 mEq/L Final  . Glucose, Bld 02/23/2016 114* 70 - 99 mg/dL Final  . BUN 02/23/2016 16  6 - 23 mg/dL Final  . Creatinine, Ser 02/23/2016 0.95  0.40 - 1.50 mg/dL Final  . Calcium 02/23/2016 9.4  8.4 - 10.5 mg/dL Final  . GFR 02/23/2016 83.04  >60.00 mL/min Final  . Vitamin B-12 02/23/2016 103* 211 - 911 pg/mL Final     Examination:   BP 136/72 mmHg  Pulse 66  Ht 5' 10.5" (1.791 m)  Wt 207 lb (93.895 kg)  BMI 29.27  kg/m2  SpO2 97%  Body mass index is 29.27 kg/(m^2).      ASSESSMENT/ PLAN:     Diabetes type 2 with mild obesity:   See history of present illness for detailed discussion of current diabetes management, blood sugar patterns and problems identified His blood sugars are excellent and improved with his weight loss He will continue his regimen of Actos 30 mg, metformin and low dose Amaryl Encouraged him to keep his weight down with regular exercise and watching portions He needs to start checking his blood sugar again   Hypercholesterolemia: LDL to be checked again on the next visit, currently on Lipitor  Hypertension: Well controlled  However with his potassium high normal to reduce his Avapro to 5 mg  Vitamin B12 deficiency:  He will resume 1000 g daily which he has not been taking, needs follow-up in 4 months  Ladarrious Kirksey 02/26/2016, 10:02 AM

## 2016-02-26 NOTE — Telephone Encounter (Signed)
Please let him know that his potassium was slightly high, have sent prescription for a reduced dose of ramipril 5 mg which should help. He can let us know if blood pressure any different with this

## 2016-02-26 NOTE — Patient Instructions (Signed)
Vitamin B12, 1000ug daily long term

## 2016-02-26 NOTE — Telephone Encounter (Signed)
Requested a call from the pt to discuss.

## 2016-03-01 ENCOUNTER — Other Ambulatory Visit: Payer: Self-pay | Admitting: Otolaryngology

## 2016-03-01 DIAGNOSIS — H8102 Meniere's disease, left ear: Secondary | ICD-10-CM

## 2016-03-16 ENCOUNTER — Ambulatory Visit
Admission: RE | Admit: 2016-03-16 | Discharge: 2016-03-16 | Disposition: A | Discharge status: Home / Self Care | Payer: Medicare Other | Source: Ambulatory Visit | Attending: Otolaryngology | Admitting: Otolaryngology

## 2016-03-16 DIAGNOSIS — H8102 Meniere's disease, left ear: Secondary | ICD-10-CM

## 2016-03-16 DIAGNOSIS — H9192 Unspecified hearing loss, left ear: Secondary | ICD-10-CM | POA: Diagnosis not present

## 2016-03-16 MED ORDER — GADOBENATE DIMEGLUMINE 529 MG/ML IV SOLN
20.0000 mL | Freq: Once | INTRAVENOUS | Status: AC | PRN
  Administered 2016-03-16: 20 mL via INTRAVENOUS

## 2016-03-22 ENCOUNTER — Other Ambulatory Visit: Payer: Self-pay | Admitting: Endocrinology

## 2016-03-25 DIAGNOSIS — H9312 Tinnitus, left ear: Secondary | ICD-10-CM | POA: Diagnosis not present

## 2016-03-25 DIAGNOSIS — H8102 Meniere's disease, left ear: Secondary | ICD-10-CM | POA: Diagnosis not present

## 2016-03-25 DIAGNOSIS — H903 Sensorineural hearing loss, bilateral: Secondary | ICD-10-CM | POA: Diagnosis not present

## 2016-05-11 ENCOUNTER — Other Ambulatory Visit (HOSPITAL_BASED_OUTPATIENT_CLINIC_OR_DEPARTMENT_OTHER): Payer: Medicare Other

## 2016-05-11 DIAGNOSIS — C61 Malignant neoplasm of prostate: Secondary | ICD-10-CM | POA: Diagnosis not present

## 2016-05-11 LAB — CBC WITH DIFFERENTIAL/PLATELET
BASO%: 1.1 % (ref 0.0–2.0)
Basophils Absolute: 0.1 10*3/uL (ref 0.0–0.1)
EOS%: 1.1 % (ref 0.0–7.0)
Eosinophils Absolute: 0.1 10*3/uL (ref 0.0–0.5)
HEMATOCRIT: 35 % — AB (ref 38.4–49.9)
HEMOGLOBIN: 11.4 g/dL — AB (ref 13.0–17.1)
LYMPH#: 1.8 10*3/uL (ref 0.9–3.3)
LYMPH%: 25.8 % (ref 14.0–49.0)
MCH: 31.4 pg (ref 27.2–33.4)
MCHC: 32.7 g/dL (ref 32.0–36.0)
MCV: 96 fL (ref 79.3–98.0)
MONO#: 0.7 10*3/uL (ref 0.1–0.9)
MONO%: 9.3 % (ref 0.0–14.0)
NEUT#: 4.4 10*3/uL (ref 1.5–6.5)
NEUT%: 62.7 % (ref 39.0–75.0)
Platelets: 250 10*3/uL (ref 140–400)
RBC: 3.64 10*6/uL — ABNORMAL LOW (ref 4.20–5.82)
RDW: 13.2 % (ref 11.0–14.6)
WBC: 7 10*3/uL (ref 4.0–10.3)

## 2016-05-11 LAB — COMPREHENSIVE METABOLIC PANEL
ALBUMIN: 3.7 g/dL (ref 3.5–5.0)
ALK PHOS: 56 U/L (ref 40–150)
ALT: 17 U/L (ref 0–55)
AST: 16 U/L (ref 5–34)
Anion Gap: 9 mEq/L (ref 3–11)
BUN: 21.6 mg/dL (ref 7.0–26.0)
CALCIUM: 9.4 mg/dL (ref 8.4–10.4)
CO2: 29 mEq/L (ref 22–29)
CREATININE: 1.4 mg/dL — AB (ref 0.7–1.3)
Chloride: 103 mEq/L (ref 98–109)
EGFR: 49 mL/min/{1.73_m2} — ABNORMAL LOW (ref >90)
Glucose: 147 mg/dl — ABNORMAL HIGH (ref 70–140)
Potassium: 5.1 mEq/L (ref 3.5–5.1)
Sodium: 141 mEq/L (ref 136–145)
Total Bilirubin: 0.45 mg/dL (ref 0.20–1.20)
Total Protein: 6.6 g/dL (ref 6.4–8.3)

## 2016-05-12 ENCOUNTER — Other Ambulatory Visit: Payer: Self-pay | Admitting: Endocrinology

## 2016-05-12 LAB — PSA

## 2016-05-13 ENCOUNTER — Ambulatory Visit (HOSPITAL_BASED_OUTPATIENT_CLINIC_OR_DEPARTMENT_OTHER): Payer: Medicare Other

## 2016-05-13 ENCOUNTER — Ambulatory Visit (HOSPITAL_BASED_OUTPATIENT_CLINIC_OR_DEPARTMENT_OTHER): Payer: Medicare Other | Admitting: Oncology

## 2016-05-13 ENCOUNTER — Telehealth: Payer: Self-pay | Admitting: Oncology

## 2016-05-13 VITALS — BP 134/71 | HR 63 | Temp 98.1°F | Resp 18 | Ht 70.5 in | Wt 217.7 lb

## 2016-05-13 DIAGNOSIS — H9190 Unspecified hearing loss, unspecified ear: Secondary | ICD-10-CM

## 2016-05-13 DIAGNOSIS — C61 Malignant neoplasm of prostate: Secondary | ICD-10-CM

## 2016-05-13 DIAGNOSIS — Z5111 Encounter for antineoplastic chemotherapy: Secondary | ICD-10-CM

## 2016-05-13 DIAGNOSIS — Z8546 Personal history of malignant neoplasm of prostate: Secondary | ICD-10-CM

## 2016-05-13 MED ORDER — LEUPROLIDE ACETATE (4 MONTH) 30 MG IM KIT
30.0000 mg | PACK | Freq: Once | INTRAMUSCULAR | Status: AC
  Administered 2016-05-13: 30 mg via INTRAMUSCULAR
  Filled 2016-05-13: qty 30

## 2016-05-13 NOTE — Patient Instructions (Signed)
Leuprolide depot injection or implant What is this medicine? LEUPROLIDE (loo PROE lide) is a man-made protein that acts like a natural hormone in the body. It decreases testosterone in men and decreases estrogen in women. In men, this medicine is used to treat advanced prostate cancer. In women, some forms of this medicine may be used to treat endometriosis, uterine fibroids, or other male hormone-related problems. This medicine may be used for other purposes; ask your health care provider or pharmacist if you have questions. COMMON BRAND NAME(S): Eligard, Lupron Depot, Lupron Depot-Ped, Viadur What should I tell my health care provider before I take this medicine? They need to know if you have any of these conditions: -diabetes -heart disease or previous heart attack -high blood pressure -high cholesterol -osteoporosis -pain or difficulty passing urine -spinal cord metastasis -stroke -tobacco smoker -unusual vaginal bleeding (women) -an unusual or allergic reaction to leuprolide, benzyl alcohol, other medicines, foods, dyes, or preservatives -pregnant or trying to get pregnant -breast-feeding How should I use this medicine? This medicine is for injection into a muscle or for implant or injection under the skin. It is given by a health care professional in a hospital or clinic setting. The specific product will determine how it will be given to you. Make sure you understand which product you receive and how often you will receive it. Talk to your pediatrician regarding the use of this medicine in children. Special care may be needed. Overdosage: If you think you have taken too much of this medicine contact a poison control center or emergency room at once. NOTE: This medicine is only for you. Do not share this medicine with others. What if I miss a dose? It is important not to miss a dose. Call your doctor or health care professional if you are unable to keep an appointment. Depot  injections: Depot injections are given either once-monthly, every 12 weeks, every 16 weeks, or every 24 weeks depending on the product you are prescribed. The product you are prescribed will be based on if you are male or male, and your condition. Make sure you understand your product and dosing. Implant dosing: The implant is removed and replaced once a year. The implant is only used in males. What may interact with this medicine? Do not take this medicine with any of the following medications: -chasteberry This medicine may also interact with the following medications: -herbal or dietary supplements, like black cohosh or DHEA -male hormones, like estrogens or progestins and birth control pills, patches, rings, or injections -male hormones, like testosterone This list may not describe all possible interactions. Give your health care provider a list of all the medicines, herbs, non-prescription drugs, or dietary supplements you use. Also tell them if you smoke, drink alcohol, or use illegal drugs. Some items may interact with your medicine. What should I watch for while using this medicine? Visit your doctor or health care professional for regular checks on your progress. During the first weeks of treatment, your symptoms may get worse, but then will improve as you continue your treatment. You may get hot flashes, increased bone pain, increased difficulty passing urine, or an aggravation of nerve symptoms. Discuss these effects with your doctor or health care professional, some of them may improve with continued use of this medicine. Male patients may experience a menstrual cycle or spotting during the first months of therapy with this medicine. If this continues, contact your doctor or health care professional. What side effects may I notice from   receiving this medicine? Side effects that you should report to your doctor or health care professional as soon as possible: -allergic reactions like  skin rash, itching or hives, swelling of the face, lips, or tongue -breathing problems -chest pain -depression or memory disorders -pain in your legs or groin -pain at site where injected or implanted -severe headache -swelling of the feet and legs -visual changes -vomiting Side effects that usually do not require medical attention (report to your doctor or health care professional if they continue or are bothersome): -breast swelling or tenderness -decrease in sex drive or performance -diarrhea -hot flashes -loss of appetite -muscle, joint, or bone pains -nausea -redness or irritation at site where injected or implanted -skin problems or acne This list may not describe all possible side effects. Call your doctor for medical advice about side effects. You may report side effects to FDA at 1-800-FDA-1088. Where should I keep my medicine? This drug is given in a hospital or clinic and will not be stored at home. NOTE: This sheet is a summary. It may not cover all possible information. If you have questions about this medicine, talk to your doctor, pharmacist, or health care provider.  2015, [employer] Standard. (2010-02-17 14:41:21)  

## 2016-05-13 NOTE — Progress Notes (Signed)
Hematology and Oncology Follow Up Visit  BURNEY VICTORINE [name] May 11, 1946 70 y.o. 05/13/2016 10:11 AM    Principle Diagnosis: 70 year old gentleman with prostate cancer diagnosed in December of 2005. He had a Gleason score 4+3 = 7 with a PSA of 3.66  Prior Therapy: 1. He is status post retropubic prostatectomy and bilateral lymph node dissection done on 11/25/2004. The pathology showed a Gleason score of 4+4 equals 8 with staging of T2b N0. His PSA nadir was at 0.12. 2. he is status post salvage radiation therapy done between March and May of 2007 for a rise in his PSA to 0.29. He received total of 65 gray in 35 fractions. 3. he subsequently had another rise in his PSA up to 1.52. He was treated with Lupron intermittently last treatment was in October of 2010 with a PSA nadir down to 0.08. Most recent PSA was up to 8.91.   Current therapy:  Intermittent androgen deprivation for biochemically relapsing prostate cancer.  Lupron was restarted in April 2016 he is currently receiving 40 mg every 4 months.  Interim History: Mr. Clemons presents today for a followup visit with his wife. Since his last visit, he reports no major changes in his health. He tolerated Lupron very well without any major side effects. He does report very mild hot flashes and has been tolerated.  He denies any bone pain or urinary symptoms. He continues to have excellent quality of life and performance status. He denied any recent hospitalizations or illnesses.  He is not reporting any headaches, blurry vision or syncope or seizures. He is not reporting any chest pain or shortness of breath is not reporting any back pain is not reporting any genitourinary complaints. Does not report any fevers or chills or sweats. Does not report any chest pain or shortness of breath. As that report any nausea or vomiting. Did not report any frequency urgency or hesitancy. Rest of his review of systems unremarkable.  Medications: I have  reviewed the patient's current medications.   Allergies:  Allergies  Allergen Reactions  . Effexor Xr [Venlafaxine Hcl] Rash    Past Medical History, Surgical history, Social history, and Family History were reviewed and updated.  Marland Kitchen Physical Exam: Blood pressure 134/71, pulse 63, temperature 98.1 F (36.7 C), temperature source Oral, resp. rate 18, height 5' 10.5" (1.791 m), weight 217 lb 11.2 oz (98.7 kg), SpO2 100 %. ECOG: 0 General appearance: Well-appearing gentleman without distress. Head: Normocephalic, without obvious abnormality no oral ulcers or lesions. Neck: no adenopathy Lymph nodes: Cervical, supraclavicular, and axillary nodes normal. Heart:regular rate and rhythm, S1, S2 normal, no murmur, click, rub or gallop Lung:chest clear, no wheezing, rales, normal symmetric air entry Abdomin: soft, non-tender, without masses or organomegaly no shifting dullness or ascites. EXT:no erythema, induration, or nodules Skin: No rashes or lesions noted.  Lab Results: Lab Results  Component Value Date   WBC 7.0 05/11/2016   HGB 11.4 (L) 05/11/2016   HCT 35.0 (L) 05/11/2016   MCV 96.0 05/11/2016   PLT 250 05/11/2016     Chemistry      Component Value Date/Time   NA 141 05/11/2016 1424   K 5.1 05/11/2016 1424   CL 104 02/23/2016 0932   CL 102 02/20/2013 1254   CO2 29 05/11/2016 1424   BUN 21.6 05/11/2016 1424   CREATININE 1.4 (H) 05/11/2016 1424      Component Value Date/Time   CALCIUM 9.4 05/11/2016 1424   ALKPHOS 56 05/11/2016 1424  AST 16 05/11/2016 1424   ALT 17 05/11/2016 1424   BILITOT 0.45 05/11/2016 1424      Results for EREK, FOTH (MRN [id number]) as of 05/13/2016 10:00  Ref. Range 08/26/2015 08:17 01/06/2016 11:17 05/11/2016 14:24  PSA Latest Ref Range: 0.0 - 4.0 ng/mL 0.04 <0.1 <0.1       Impression and Plan: 70 year old gentleman with:   1. Prostate cancer: Initially diagnosed in 2005 with Gleason score of 4+4 equals 8, PSA at the time  diagnosed at 3.66. He has biochemically relapsing disease with PSA of  To 8.91. Staging workup including a bone scan and CT scan showed no evidence of disease.  He is currently hormone deprivation only in the form of Lupron that was started in April 2016. His PSA continues to be under excellent control and have tolerated androgen deprivation well. Last PSA continues to be less than 0.1.  Risks and benefits of continuing Lupron today were reviewed. These complications include osteoporosis, cardiovascular complications among others socially with long-term androgen deprivation. The benefit would be controlling his cancer long-term. He is agreeable to proceed at this time and he will receive 40 mg of Lupron every 4 months.  2. Hearing loss: No major changes since last visit. Unrelated to his prostate cancer.  3. Followup: In 4 months for his next Lupron injection.    [facility], MD 9/14/201710:11 AM

## 2016-05-13 NOTE — Telephone Encounter (Signed)
Gave patient avs report and appointments for January  °

## 2016-06-05 IMAGING — CT CT ABD-PELV W/ CM
2 of 6 series · 17 of 46 positions shown, 19 images · IV contrast (OMNIPAQUE)
Comparison: Bone scan 02/07/2008. No prior cross-sectional imaging
for comparison.

CLINICAL DATA: Prostate cancer, restaging.  Rising PSA.

EXAM:
CT ABDOMEN AND PELVIS WITH CONTRAST
TECHNIQUE: Multidetector CT imaging of the abdomen and pelvis was performed
using the standard protocol following bolus administration of
intravenous contrast.
CONTRAST:  100mL OMNIPAQUE IOHEXOL 300 MG/ML  SOLN

[Series 2: rtn ap with st · axial · 0.84mm/px · z∈[-502,-58]mm · 14 of 101 slices shown, 16 images]
[im 6/101  soft-tissue]
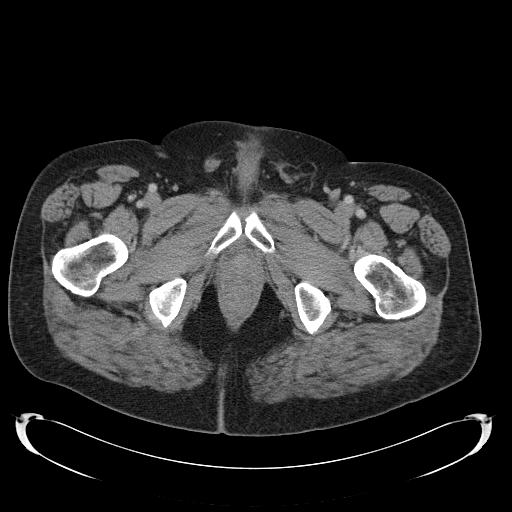
[im 6/101  bone]
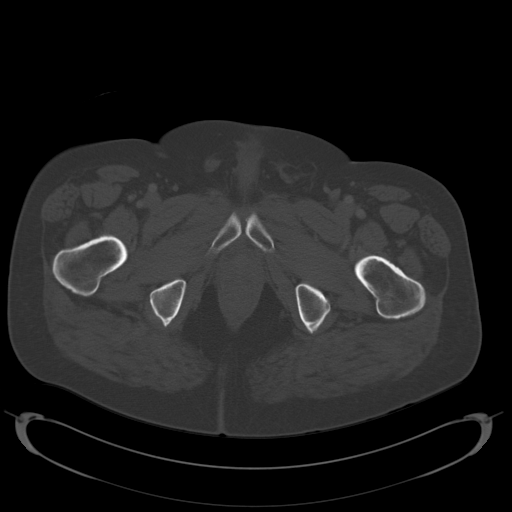
[im 12/101  soft-tissue]
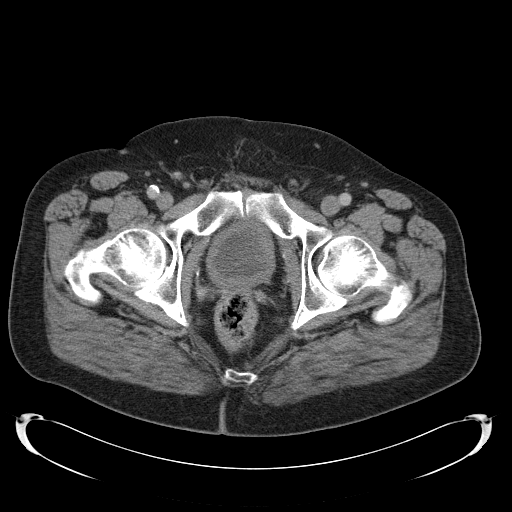
[im 18/101  soft-tissue]
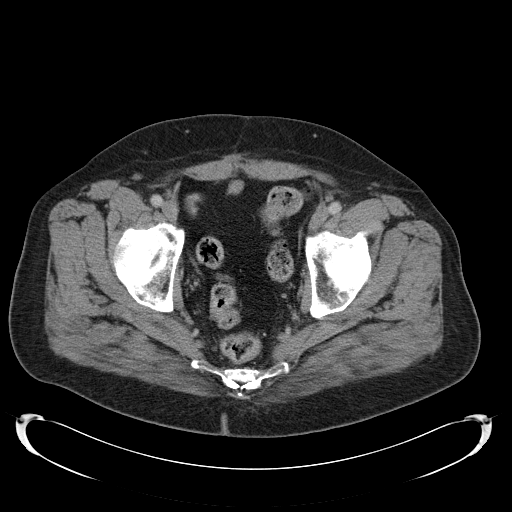
[im 30/101  soft-tissue]
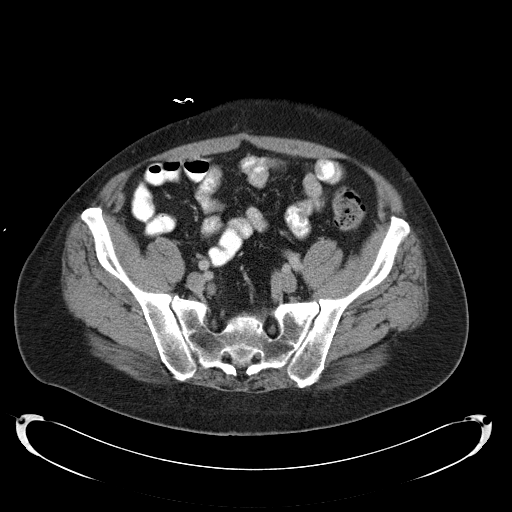
[im 36/101  soft-tissue]
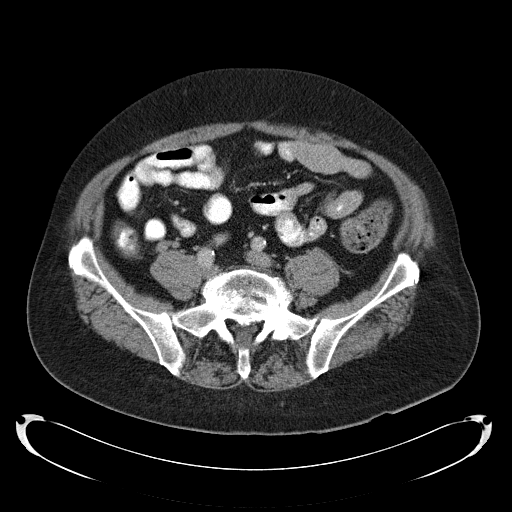
[im 42/101  soft-tissue]
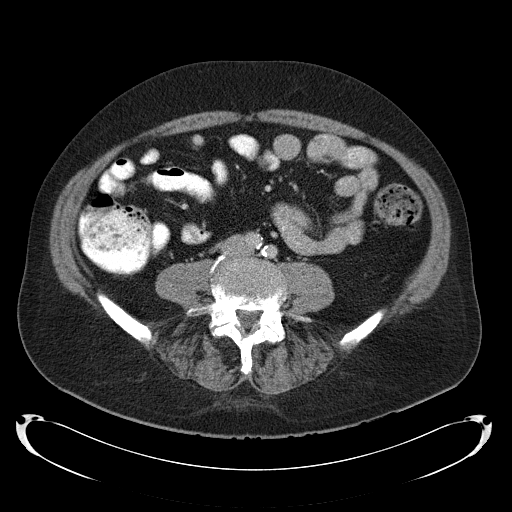
[im 48/101  soft-tissue]
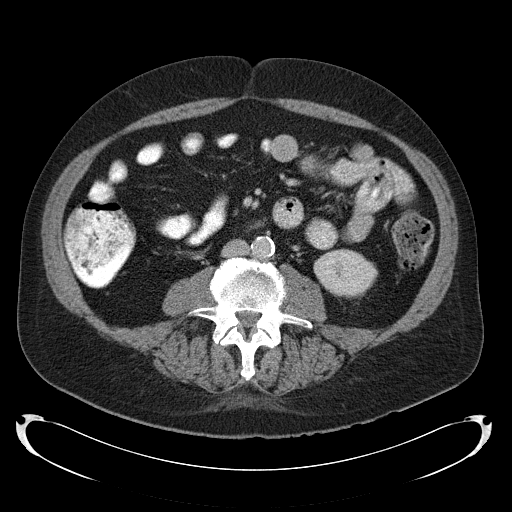
[im 53/101  soft-tissue]
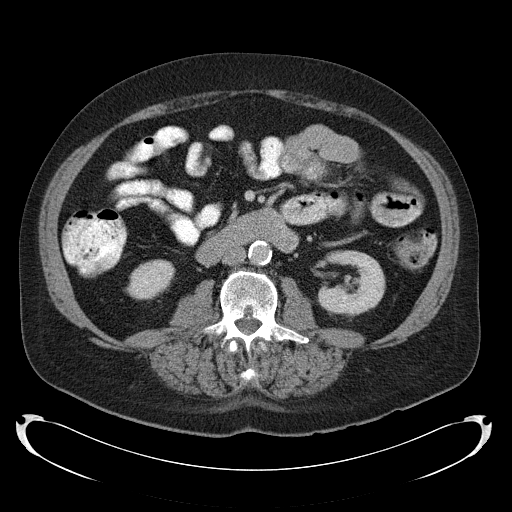
[im 59/101  soft-tissue]
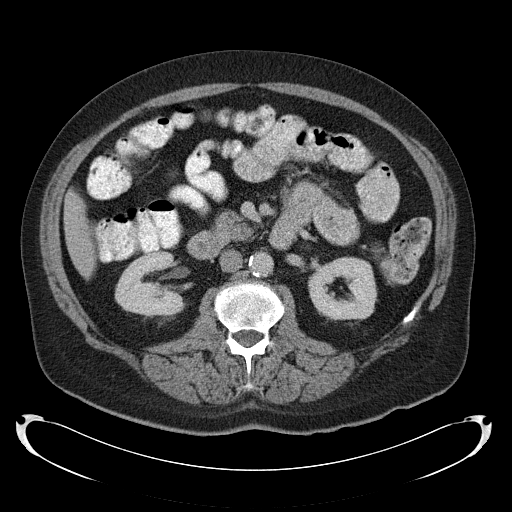
[im 59/101  bone]
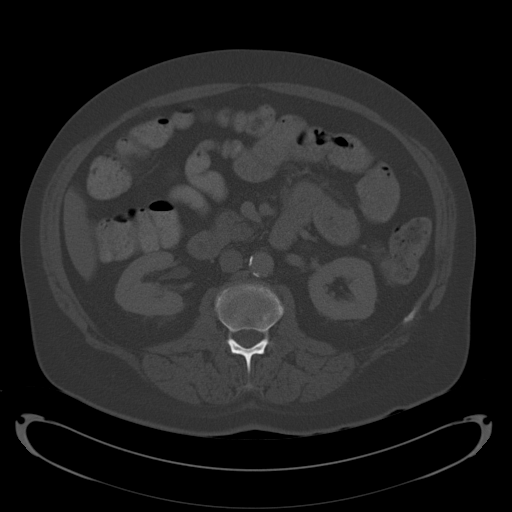
[im 65/101  soft-tissue]
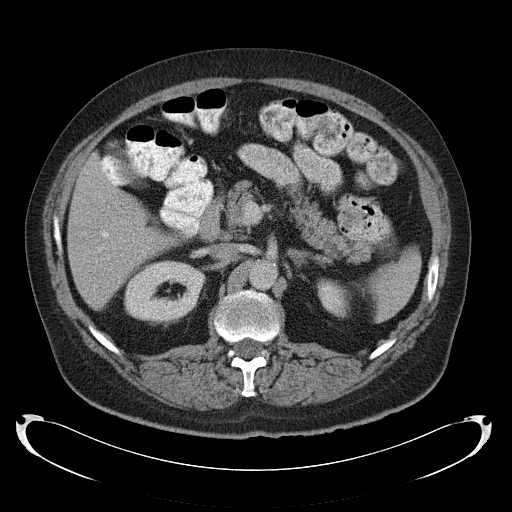
[im 77/101  soft-tissue]
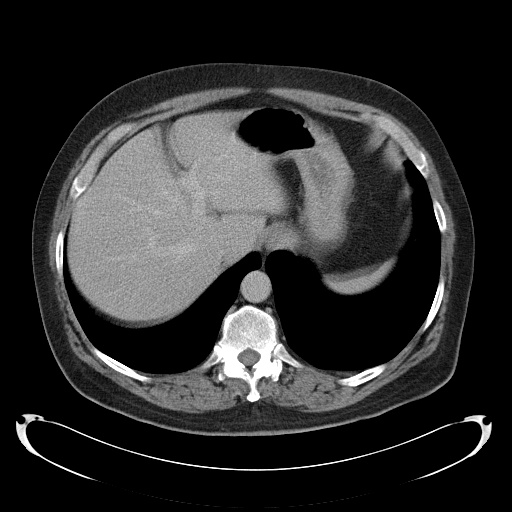
[im 83/101  soft-tissue]
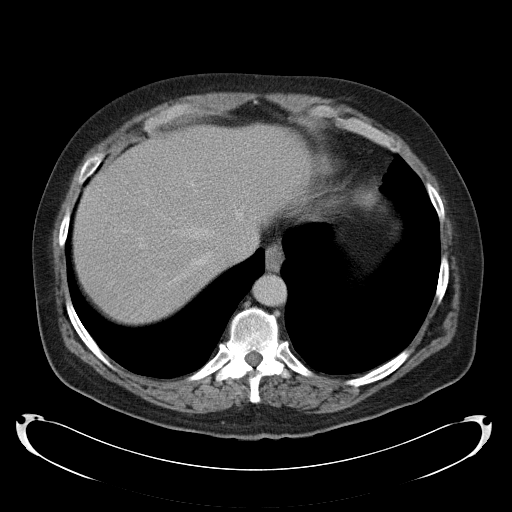
[im 89/101  soft-tissue]
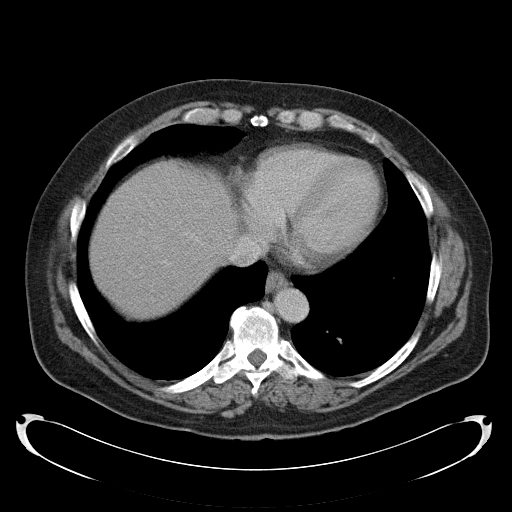
[im 95/101  soft-tissue]
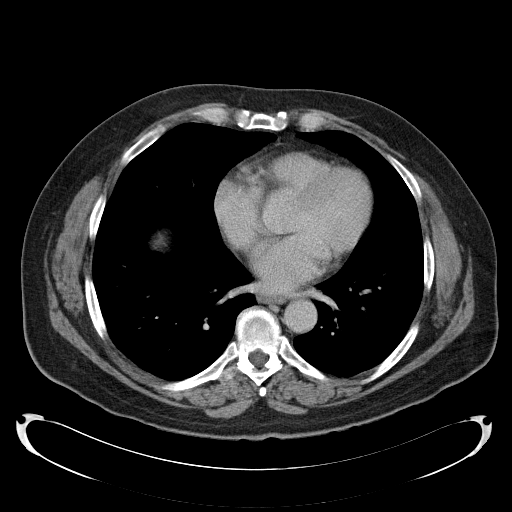

[Series 602: coronal images · coronal · 1.01mm/px · 3 of 110 slices shown]
[im 37/110  soft-tissue]
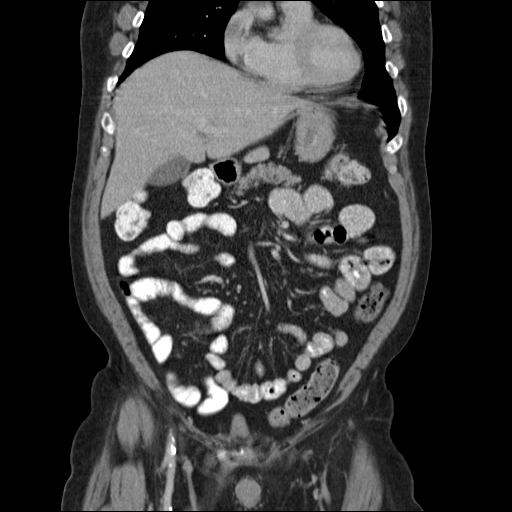
[im 49/110  soft-tissue]
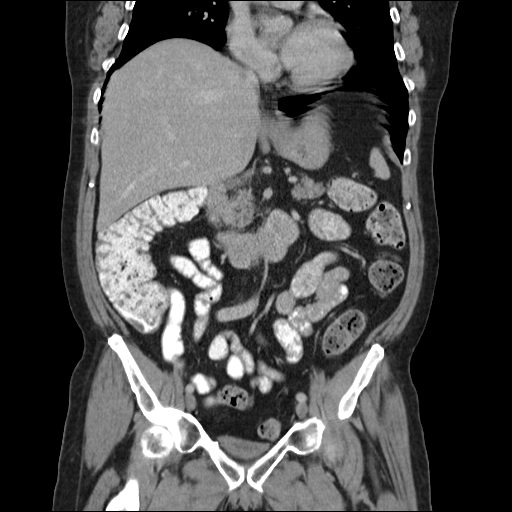
[im 61/110  soft-tissue]
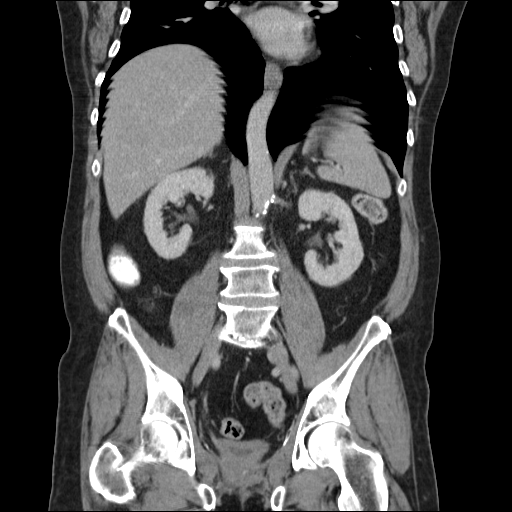

[17 of 46 positions shown; findings below may reference images not displayed]

FINDINGS: Lower chest: Motion artifact degrades [HOSPITAL] the lung bases.
Lung bases are grossly clear.

Hepatobiliary: Normal allowing for motion artifact. No focal hepatic
abnormality. Gallbladder appears normal.

Pancreas: Normal

Spleen: Normal

Adrenals/Urinary Tract: Adrenal glands are unremarkable. Right lower
renal pole fat density mass measuring 1.3 cm image 47 is compatible
with an angiomyolipoma. No surrounding fluid or high density to
suggest hemorrhage at this time. No hydronephrosis. No radiopaque
renal or ureteral calculus.

Stomach/Bowel: No bowel wall thickening or focal segmental
dilatation is identified. Normal appendix. Stomach appears normal.

Vascular/Lymphatic: Moderate atheromatous aortic calcification noted
without aneurysm. No mesenteric, porta hepatis, retroperitoneal, or
pelvic lymphadenopathy. Pericaval 4 mm node is identified image 61,
nonspecific but amenable to followup at future exams.

Other: Fat containing left inguinal hernia. The bladder is
decompressed, with concentric wall thickness at upper limits of
normal. No free air or fluid. Prostate presumed surgically absent.
No surrounding mass.

Musculoskeletal: Lumbar spine disc degenerative change noted. No
lytic or sclerotic osseous lesion. No acute osseous abnormality.
IMPRESSION: No evidence for intra-abdominal or pelvic recurrent or metastatic
disease. No acute abnormality.

## 2016-06-25 ENCOUNTER — Other Ambulatory Visit: Payer: Self-pay | Admitting: Endocrinology

## 2016-06-25 DIAGNOSIS — Z23 Encounter for immunization: Secondary | ICD-10-CM | POA: Diagnosis not present

## 2016-06-28 DIAGNOSIS — H8102 Meniere's disease, left ear: Secondary | ICD-10-CM | POA: Diagnosis not present

## 2016-06-28 DIAGNOSIS — H9312 Tinnitus, left ear: Secondary | ICD-10-CM | POA: Diagnosis not present

## 2016-06-28 DIAGNOSIS — H903 Sensorineural hearing loss, bilateral: Secondary | ICD-10-CM | POA: Diagnosis not present

## 2016-07-06 ENCOUNTER — Other Ambulatory Visit (INDEPENDENT_AMBULATORY_CARE_PROVIDER_SITE_OTHER): Payer: Medicare Other

## 2016-07-06 DIAGNOSIS — E785 Hyperlipidemia, unspecified: Secondary | ICD-10-CM | POA: Diagnosis not present

## 2016-07-06 DIAGNOSIS — E538 Deficiency of other specified B group vitamins: Secondary | ICD-10-CM

## 2016-07-06 DIAGNOSIS — E119 Type 2 diabetes mellitus without complications: Secondary | ICD-10-CM | POA: Diagnosis not present

## 2016-07-06 LAB — HEMOGLOBIN A1C: Hgb A1c MFr Bld: 6 % (ref 4.6–6.5)

## 2016-07-06 LAB — COMPREHENSIVE METABOLIC PANEL
ALBUMIN: 4 g/dL (ref 3.5–5.2)
ALK PHOS: 46 U/L (ref 39–117)
ALT: 13 U/L (ref 0–53)
AST: 15 U/L (ref 0–37)
BUN: 21 mg/dL (ref 6–23)
CO2: 29 mEq/L (ref 19–32)
CREATININE: 1.09 mg/dL (ref 0.40–1.50)
Calcium: 9.5 mg/dL (ref 8.4–10.5)
Chloride: 104 mEq/L (ref 96–112)
GFR: 70.78 mL/min (ref >60.00)
Glucose, Bld: 109 mg/dL — ABNORMAL HIGH (ref 70–99)
POTASSIUM: 4.3 meq/L (ref 3.5–5.1)
SODIUM: 139 meq/L (ref 135–145)
TOTAL PROTEIN: 6.4 g/dL (ref 6.0–8.3)
Total Bilirubin: 0.4 mg/dL (ref 0.2–1.2)

## 2016-07-06 LAB — CBC
HCT: 34.5 % — ABNORMAL LOW (ref 39.0–52.0)
Hemoglobin: 11.7 g/dL — ABNORMAL LOW (ref 13.0–17.0)
MCHC: 34 g/dL (ref 30.0–36.0)
MCV: 94.4 fl (ref 78.0–100.0)
PLATELETS: 260 10*3/uL (ref 150.0–400.0)
RBC: 3.66 Mil/uL — ABNORMAL LOW (ref 4.22–5.81)
RDW: 13.3 % (ref 11.5–15.5)
WBC: 6 10*3/uL (ref 4.0–10.5)

## 2016-07-06 LAB — LIPID PANEL
CHOLESTEROL: 164 mg/dL (ref 0–200)
HDL: 63.4 mg/dL (ref >39.00)
LDL Cholesterol: 70 mg/dL (ref 0–99)
NONHDL: 100.89
TRIGLYCERIDES: 154 mg/dL — AB (ref 0.0–149.0)
Total CHOL/HDL Ratio: 3
VLDL: 30.8 mg/dL (ref 0.0–40.0)

## 2016-07-06 LAB — VITAMIN B12: VITAMIN B 12: 283 pg/mL (ref 211–911)

## 2016-07-09 ENCOUNTER — Encounter: Payer: Self-pay | Admitting: Endocrinology

## 2016-07-09 ENCOUNTER — Ambulatory Visit (INDEPENDENT_AMBULATORY_CARE_PROVIDER_SITE_OTHER): Payer: Medicare Other | Admitting: Endocrinology

## 2016-07-09 ENCOUNTER — Other Ambulatory Visit: Payer: Self-pay

## 2016-07-09 VITALS — BP 120/69 | HR 70 | Ht 74.0 in | Wt 226.0 lb

## 2016-07-09 DIAGNOSIS — E119 Type 2 diabetes mellitus without complications: Secondary | ICD-10-CM

## 2016-07-09 LAB — MICROALBUMIN / CREATININE URINE RATIO
CREATININE, U: 82.3 mg/dL
MICROALB UR: 1.9 mg/dL (ref 0.0–1.9)
MICROALB/CREAT RATIO: 2.3 mg/g (ref 0.0–30.0)

## 2016-07-09 MED ORDER — GLUCOSE BLOOD VI STRP: ORAL_STRIP | 1 refills | Status: DC

## 2016-07-09 MED ORDER — ONETOUCH DELICA LANCETS FINE MISC: 3 refills | Status: DC

## 2016-07-09 MED ORDER — METFORMIN HCL 1000 MG PO TABS: ORAL_TABLET | ORAL | 0 refills | Status: DC

## 2016-07-09 NOTE — Patient Instructions (Signed)
Exercise  Check blood sugars on waking up  2-3x per week  Also check blood sugars about 2 hours after a meal and do this after different meals by rotation  Recommended blood sugar levels on waking up is 90-130 and about 2 hours after meal is 130-160  Please bring your blood sugar monitor to each visit, thank you

## 2016-07-09 NOTE — Progress Notes (Signed)
Patient ID: Richard [name], male   DOB: 10-12-1945, 70 y.o.   MRN: [id number]   Reason for Appointment:  Follow-up  History of Present Illness   Problem 1:  DIABETES MELITUS, date of diagnosis 1995 with a glucose of 320 and HemoglobinA1c of 9.6 Previous history: He has been on various oral hypoglycemic drugs in the past but for the last few years has required multiple drugs in combination; diabetes has been generally well controlled with A1c near normal  Recent history:   Oral hypoglycemic drugs: Amaryl 2 mg once a day in a.m., metformin 1000 twice a day, Actos 15 mg daily  Current management, blood sugar patterns and problems identified:  His A1c  He is still fairly good, 6% now and previously down to 5.7     however he has gained a significant amount of weight which he says is from not watching his diet and having more time when he is retired.   also not walking regularly as he used to.   He says he has only a couple of readings at home and is not motivated to monitor  Lab glucose was  109 fasting     Side effects from medications: None       Monitors blood glucose:  irregularly     Glucometer: One Touch.          Blood Glucose readings: Checked  3 times in the last month about 130   Hypoglycemia:  none                 Physical activity: exercise:  Occasional walking  Dietician visit: Most recent:? 2007   Weight control:      Wt Readings from Last 3 Encounters:  07/09/16 226 lb (102.5 kg)  05/13/16 217 lb 11.2 oz (98.7 kg)  03/16/16 207 lb (93.9 kg)    Diabetes labs:  Lab Results  Component Value Date   HGBA1C 6.0 07/06/2016   HGBA1C 5.7 02/23/2016   HGBA1C 6.6 (H) 10/17/2015   Lab Results  Component Value Date   MICROALBUR 5.3 (H) 04/23/2015   LDLCALC 70 07/06/2016   CREATININE 1.09 07/06/2016    Other problems: See review of systems   Office Visit on 07/09/2016  Component Date Value Ref Range Status  . Microalb, Ur 07/09/2016 1.9  0.0 -  1.9 mg/dL Final  . Creatinine,U 07/09/2016 82.3  mg/dL Final  . Microalb Creat Ratio 07/09/2016 2.3  0.0 - 30.0 mg/g Final  Lab on 07/06/2016  Component Date Value Ref Range Status  . Hgb A1c MFr Bld 07/06/2016 6.0  4.6 - 6.5 % Final  . Sodium 07/06/2016 139  135 - 145 mEq/L Final  . Potassium 07/06/2016 4.3  3.5 - 5.1 mEq/L Final  . Chloride 07/06/2016 104  96 - 112 mEq/L Final  . CO2 07/06/2016 29  19 - 32 mEq/L Final  . Glucose, Bld 07/06/2016 109* 70 - 99 mg/dL Final  . BUN 07/06/2016 21  6 - 23 mg/dL Final  . Creatinine, Ser 07/06/2016 1.09  0.40 - 1.50 mg/dL Final  . Total Bilirubin 07/06/2016 0.4  0.2 - 1.2 mg/dL Final  . Alkaline Phosphatase 07/06/2016 46  39 - 117 U/L Final  . AST 07/06/2016 15  0 - 37 U/L Final  . ALT 07/06/2016 13  0 - 53 U/L Final  . Total Protein 07/06/2016 6.4  6.0 - 8.3 g/dL Final  . Albumin 07/06/2016 4.0  3.5 - 5.2 g/dL Final  .  Calcium 07/06/2016 9.5  8.4 - 10.5 mg/dL Final  . GFR 07/06/2016 70.78  >60.00 mL/min Final  . Vitamin B-12 07/06/2016 283  211 - 911 pg/mL Final  . WBC 07/06/2016 6.0  4.0 - 10.5 K/uL Final  . RBC 07/06/2016 3.66* 4.22 - 5.81 Mil/uL Final  . Platelets 07/06/2016 260.0  150.0 - 400.0 K/uL Final  . Hemoglobin 07/06/2016 11.7* 13.0 - 17.0 g/dL Final  . HCT 07/06/2016 34.5* 39.0 - 52.0 % Final  . MCV 07/06/2016 94.4  78.0 - 100.0 fl Final  . MCHC 07/06/2016 34.0  30.0 - 36.0 g/dL Final  . RDW 07/06/2016 13.3  11.5 - 15.5 % Final  . Cholesterol 07/06/2016 164  0 - 200 mg/dL Final  . Triglycerides 07/06/2016 154.0* 0.0 - 149.0 mg/dL Final  . HDL 07/06/2016 63.40  >39.00 mg/dL Final  . VLDL 07/06/2016 30.8  0.0 - 40.0 mg/dL Final  . LDL Cholesterol 07/06/2016 70  0 - 99 mg/dL Final  . Total CHOL/HDL Ratio 07/06/2016 3   Final  . NonHDL 07/06/2016 100.89   Final       Medication List       Accurate as of 07/09/16 10:20 AM. Always use your most recent med list.          amLODipine 5 MG tablet Commonly known as:   NORVASC TAKE 1 TABLET(5 MG) BY MOUTH DAILY   amLODipine 5 MG tablet Commonly known as:  NORVASC TAKE 1 TABLET(5 MG) BY MOUTH DAILY   atorvastatin 10 MG tablet Commonly known as:  LIPITOR TAKE 1 TABLET(10 MG) BY MOUTH DAILY   escitalopram 10 MG tablet Commonly known as:  LEXAPRO Take 10 mg by mouth daily.   glimepiride 2 MG tablet Commonly known as:  AMARYL TAKE 1 TABLET(2 MG) BY MOUTH DAILY WITH BREAKFAST   glimepiride 2 MG tablet Commonly known as:  AMARYL TAKE 1 TABLET(2 MG) BY MOUTH DAILY WITH BREAKFAST   glucose blood test strip Commonly known as:  ONE TOUCH ULTRA TEST Use as instructed to check blood sugar 3 times per week dx code E11.9   LUPRON DEPOT (50-MONTH) IM Inject into the muscle.   metFORMIN 1000 MG tablet Commonly known as:  GLUCOPHAGE TAKE 1 TABLET(1000 MG) BY MOUTH TWICE DAILY WITH A MEAL   metFORMIN 1000 MG tablet Commonly known as:  GLUCOPHAGE TAKE 1 TABLET(1000 MG) BY MOUTH TWICE DAILY WITH A MEAL   ONE TOUCH ULTRA 2 w/Device Kit Use to check blood sugar 3 times per week dx code R44.3   ONETOUCH DELICA LANCETS FINE Misc Use to check blood sugar 3 times per week dx code E11.9   pioglitazone 30 MG tablet Commonly known as:  ACTOS TAKE 1 TABLET(30 MG) BY MOUTH DAILY   pioglitazone 30 MG tablet Commonly known as:  ACTOS TAKE 1 TABLET(30 MG) BY MOUTH DAILY   ramipril 5 MG capsule Commonly known as:  ALTACE TAKE 1 CAPSULE (10 MG TOTAL) BY MOUTH DAILY.   ramipril 10 MG capsule Commonly known as:  ALTACE TAKE 1 CAPSULE(10 MG) BY MOUTH DAILY   ramipril 10 MG capsule Commonly known as:  ALTACE TAKE 1 CAPSULE(10 MG) BY MOUTH DAILY   triamterene-hydrochlorothiazide 37.5-25 MG capsule Commonly known as:  DYAZIDE Take 1 capsule by mouth daily.       Allergies:  Allergies  Allergen Reactions  . Effexor Xr [Venlafaxine Hcl] Rash    Past Medical History:  Diagnosis Date  . Diabetes mellitus without complication ([place])   .  Hypertension     . prostate ca dx'd 2006   prostatectomy and xrt    No past surgical history on file.  Family History  Problem Relation Age of Onset  . Hypertension Father   . Heart disease Neg Hx   . Diabetes Neg Hx     Social History:  reports that he has been smoking.  He has never used smokeless tobacco. His alcohol and drug histories are not on file.    REVIEW OF SYSTEMS:       Hypertension:  blood pressure has been  well controlled with 5 mg ramipril and Dyazide, also on amlodipine 5 mg.  Does  monitor at home occasionally, usually well-controlled  Lipids:  he has been taking Lipitor now, previously on simvastatin LDL is controlled as of 2/17   Lab Results  Component Value Date   CHOL 164 07/06/2016   HDL 63.40 07/06/2016   LDLCALC 70 07/06/2016   TRIG 154.0 (H) 07/06/2016   CHOLHDL 3 07/06/2016     History of vitamin B12 deficiency in the past causing anemia.  Has been asked to restart supplements But he is still not doing so He is slightly anemic and B12 is   He is taking Lexapro for depression  He had hearing loss and was given prednisone for this  LABS:  Lab on 07/06/2016  Component Date Value Ref Range Status  . Hgb A1c MFr Bld 07/06/2016 6.0  4.6 - 6.5 % Final  . Sodium 07/06/2016 139  135 - 145 mEq/L Final  . Potassium 07/06/2016 4.3  3.5 - 5.1 mEq/L Final  . Chloride 07/06/2016 104  96 - 112 mEq/L Final  . CO2 07/06/2016 29  19 - 32 mEq/L Final  . Glucose, Bld 07/06/2016 109* 70 - 99 mg/dL Final  . BUN 07/06/2016 21  6 - 23 mg/dL Final  . Creatinine, Ser 07/06/2016 1.09  0.40 - 1.50 mg/dL Final  . Total Bilirubin 07/06/2016 0.4  0.2 - 1.2 mg/dL Final  . Alkaline Phosphatase 07/06/2016 46  39 - 117 U/L Final  . AST 07/06/2016 15  0 - 37 U/L Final  . ALT 07/06/2016 13  0 - 53 U/L Final  . Total Protein 07/06/2016 6.4  6.0 - 8.3 g/dL Final  . Albumin 07/06/2016 4.0  3.5 - 5.2 g/dL Final  . Calcium 07/06/2016 9.5  8.4 - 10.5 mg/dL Final  . GFR 07/06/2016 70.78   >60.00 mL/min Final  . Vitamin B-12 07/06/2016 283  211 - 911 pg/mL Final  . WBC 07/06/2016 6.0  4.0 - 10.5 K/uL Final  . RBC 07/06/2016 3.66* 4.22 - 5.81 Mil/uL Final  . Platelets 07/06/2016 260.0  150.0 - 400.0 K/uL Final  . Hemoglobin 07/06/2016 11.7* 13.0 - 17.0 g/dL Final  . HCT 07/06/2016 34.5* 39.0 - 52.0 % Final  . MCV 07/06/2016 94.4  78.0 - 100.0 fl Final  . MCHC 07/06/2016 34.0  30.0 - 36.0 g/dL Final  . RDW 07/06/2016 13.3  11.5 - 15.5 % Final  . Cholesterol 07/06/2016 164  0 - 200 mg/dL Final  . Triglycerides 07/06/2016 154.0* 0.0 - 149.0 mg/dL Final  . HDL 07/06/2016 63.40  >39.00 mg/dL Final  . VLDL 07/06/2016 30.8  0.0 - 40.0 mg/dL Final  . LDL Cholesterol 07/06/2016 70  0 - 99 mg/dL Final  . Total CHOL/HDL Ratio 07/06/2016 3   Final  . NonHDL 07/06/2016 100.89   Final     Examination:   BP 120/69  Pulse 70   Ht _0  (1.88 m)   Wt 226 lb (102.5 kg)   BMI 29.02 kg/m   Body mass index is 29.02 kg/m.      ASSESSMENT/ PLAN:    Diabetes type 2 with mild obesity:   See history of present illness for detailed discussion of current diabetes management, blood sugar patterns and problems identified   Although his blood sugars are reasonably well controlled with A1c 6% he has not checked readings at home much  He has not been able to comply with diet and exercise and has gained significant amount of weight He can continue the same regimen at home [facility] wants him to switch Amaryl to glipizide, discussed that would prefer that he stay on the same regimen and get it at the local drugstore  Encouraged him to check more regularly to know what his blood sugar patterns are . He will go to lab to check urine microalbumin today  Hypercholesterolemia: LDL 70, adequately controlled  Hypertension: Well controlled  He has had occasional high potassium levels but recently normal in the lab  Vitamin B12 deficiency:  He will continue 1000 g daily   ANEMIA:  His hemoglobin is 11.7, he is getting stool Hemoccults at the [facility] where he has a primary care doctor now  [facility] 07/09/2016, 10:20 AM

## 2016-08-11 ENCOUNTER — Other Ambulatory Visit: Payer: Self-pay | Admitting: Endocrinology

## 2016-09-14 ENCOUNTER — Other Ambulatory Visit (HOSPITAL_BASED_OUTPATIENT_CLINIC_OR_DEPARTMENT_OTHER): Payer: Medicare Other

## 2016-09-14 DIAGNOSIS — C61 Malignant neoplasm of prostate: Secondary | ICD-10-CM | POA: Diagnosis not present

## 2016-09-14 LAB — CBC WITH DIFFERENTIAL/PLATELET
BASO%: 0.8 % (ref 0.0–2.0)
BASOS ABS: 0.1 10*3/uL (ref 0.0–0.1)
EOS%: 1.1 % (ref 0.0–7.0)
Eosinophils Absolute: 0.1 10*3/uL (ref 0.0–0.5)
HCT: 36 % — ABNORMAL LOW (ref 38.4–49.9)
HEMOGLOBIN: 11.9 g/dL — AB (ref 13.0–17.1)
LYMPH#: 1.7 10*3/uL (ref 0.9–3.3)
LYMPH%: 26.4 % (ref 14.0–49.0)
MCH: 31.6 pg (ref 27.2–33.4)
MCHC: 33.1 g/dL (ref 32.0–36.0)
MCV: 95.7 fL (ref 79.3–98.0)
MONO#: 0.6 10*3/uL (ref 0.1–0.9)
MONO%: 9.4 % (ref 0.0–14.0)
NEUT%: 62.3 % (ref 39.0–75.0)
NEUTROS ABS: 3.9 10*3/uL (ref 1.5–6.5)
Platelets: 234 10*3/uL (ref 140–400)
RBC: 3.76 10*6/uL — ABNORMAL LOW (ref 4.20–5.82)
RDW: 12.7 % (ref 11.0–14.6)
WBC: 6.3 10*3/uL (ref 4.0–10.3)

## 2016-09-14 LAB — COMPREHENSIVE METABOLIC PANEL
ALBUMIN: 4 g/dL (ref 3.5–5.0)
ALK PHOS: 60 U/L (ref 40–150)
ALT: 13 U/L (ref 0–55)
AST: 16 U/L (ref 5–34)
Anion Gap: 8 mEq/L (ref 3–11)
BILIRUBIN TOTAL: 0.47 mg/dL (ref 0.20–1.20)
BUN: 28.7 mg/dL — AB (ref 7.0–26.0)
CALCIUM: 9.6 mg/dL (ref 8.4–10.4)
CO2: 27 mEq/L (ref 22–29)
CREATININE: 1.2 mg/dL (ref 0.7–1.3)
Chloride: 102 mEq/L (ref 98–109)
EGFR: 58 mL/min/{1.73_m2} — ABNORMAL LOW (ref >90)
GLUCOSE: 116 mg/dL (ref 70–140)
Potassium: 4.9 mEq/L (ref 3.5–5.1)
SODIUM: 137 meq/L (ref 136–145)
TOTAL PROTEIN: 6.7 g/dL (ref 6.4–8.3)

## 2016-09-15 LAB — PSA: Prostate Specific Ag, Serum: <0.1 ng/mL (ref 0.0–4.0)

## 2016-09-15 LAB — TESTOSTERONE: Testosterone, Serum: <3 ng/dL — ABNORMAL LOW (ref 264–916)

## 2016-09-16 ENCOUNTER — Telehealth: Payer: Self-pay

## 2016-09-16 ENCOUNTER — Ambulatory Visit: Payer: Medicare Other

## 2016-09-16 ENCOUNTER — Ambulatory Visit: Payer: Medicare Other | Admitting: Oncology

## 2016-09-16 NOTE — Telephone Encounter (Signed)
Called patient to reschedule his appointments as he cancelled due to weather

## 2016-09-20 ENCOUNTER — Ambulatory Visit (HOSPITAL_BASED_OUTPATIENT_CLINIC_OR_DEPARTMENT_OTHER): Payer: Medicare Other | Admitting: Oncology

## 2016-09-20 ENCOUNTER — Telehealth: Payer: Self-pay | Admitting: Oncology

## 2016-09-20 ENCOUNTER — Ambulatory Visit (HOSPITAL_BASED_OUTPATIENT_CLINIC_OR_DEPARTMENT_OTHER): Payer: Medicare Other

## 2016-09-20 VITALS — BP 127/53 | HR 69 | Temp 97.8°F | Resp 18 | Ht 74.0 in | Wt 225.8 lb

## 2016-09-20 DIAGNOSIS — Z5111 Encounter for antineoplastic chemotherapy: Secondary | ICD-10-CM | POA: Diagnosis present

## 2016-09-20 DIAGNOSIS — C61 Malignant neoplasm of prostate: Secondary | ICD-10-CM | POA: Diagnosis not present

## 2016-09-20 DIAGNOSIS — Z8546 Personal history of malignant neoplasm of prostate: Secondary | ICD-10-CM

## 2016-09-20 DIAGNOSIS — E119 Type 2 diabetes mellitus without complications: Secondary | ICD-10-CM | POA: Diagnosis not present

## 2016-09-20 MED ORDER — LEUPROLIDE ACETATE (4 MONTH) 30 MG IM KIT
30.0000 mg | PACK | Freq: Once | INTRAMUSCULAR | Status: AC
  Administered 2016-09-20: 30 mg via INTRAMUSCULAR
  Filled 2016-09-20: qty 30

## 2016-09-20 NOTE — Patient Instructions (Signed)
Leuprolide depot injection or implant What is this medicine? LEUPROLIDE (loo PROE lide) is a man-made protein that acts like a natural hormone in the body. It decreases testosterone in men and decreases estrogen in women. In men, this medicine is used to treat advanced prostate cancer. In women, some forms of this medicine may be used to treat endometriosis, uterine fibroids, or other male hormone-related problems. This medicine may be used for other purposes; ask your health care provider or pharmacist if you have questions. COMMON BRAND NAME(S): Eligard, Lupron Depot, Lupron Depot-Ped, Viadur What should I tell my health care provider before I take this medicine? They need to know if you have any of these conditions: -diabetes -heart disease or previous heart attack -high blood pressure -high cholesterol -osteoporosis -pain or difficulty passing urine -spinal cord metastasis -stroke -tobacco smoker -unusual vaginal bleeding (women) -an unusual or allergic reaction to leuprolide, benzyl alcohol, other medicines, foods, dyes, or preservatives -pregnant or trying to get pregnant -breast-feeding How should I use this medicine? This medicine is for injection into a muscle or for implant or injection under the skin. It is given by a health care professional in a hospital or clinic setting. The specific product will determine how it will be given to you. Make sure you understand which product you receive and how often you will receive it. Talk to your pediatrician regarding the use of this medicine in children. Special care may be needed. Overdosage: If you think you have taken too much of this medicine contact a poison control center or emergency room at once. NOTE: This medicine is only for you. Do not share this medicine with others. What if I miss a dose? It is important not to miss a dose. Call your doctor or health care professional if you are unable to keep an appointment. Depot  injections: Depot injections are given either once-monthly, every 12 weeks, every 16 weeks, or every 24 weeks depending on the product you are prescribed. The product you are prescribed will be based on if you are male or male, and your condition. Make sure you understand your product and dosing. Implant dosing: The implant is removed and replaced once a year. The implant is only used in males. What may interact with this medicine? Do not take this medicine with any of the following medications: -chasteberry This medicine may also interact with the following medications: -herbal or dietary supplements, like black cohosh or DHEA -male hormones, like estrogens or progestins and birth control pills, patches, rings, or injections -male hormones, like testosterone This list may not describe all possible interactions. Give your health care provider a list of all the medicines, herbs, non-prescription drugs, or dietary supplements you use. Also tell them if you smoke, drink alcohol, or use illegal drugs. Some items may interact with your medicine. What should I watch for while using this medicine? Visit your doctor or health care professional for regular checks on your progress. During the first weeks of treatment, your symptoms may get worse, but then will improve as you continue your treatment. You may get hot flashes, increased bone pain, increased difficulty passing urine, or an aggravation of nerve symptoms. Discuss these effects with your doctor or health care professional, some of them may improve with continued use of this medicine. Male patients may experience a menstrual cycle or spotting during the first months of therapy with this medicine. If this continues, contact your doctor or health care professional. What side effects may I notice from   receiving this medicine? Side effects that you should report to your doctor or health care professional as soon as possible: -allergic reactions like  skin rash, itching or hives, swelling of the face, lips, or tongue -breathing problems -chest pain -depression or memory disorders -pain in your legs or groin -pain at site where injected or implanted -severe headache -swelling of the feet and legs -visual changes -vomiting Side effects that usually do not require medical attention (report to your doctor or health care professional if they continue or are bothersome): -breast swelling or tenderness -decrease in sex drive or performance -diarrhea -hot flashes -loss of appetite -muscle, joint, or bone pains -nausea -redness or irritation at site where injected or implanted -skin problems or acne This list may not describe all possible side effects. Call your doctor for medical advice about side effects. You may report side effects to FDA at 1-800-FDA-1088. Where should I keep my medicine? This drug is given in a hospital or clinic and will not be stored at home. NOTE: This sheet is a summary. It may not cover all possible information. If you have questions about this medicine, talk to your doctor, pharmacist, or health care provider.  2015, [employer] Standard. (2010-02-17 14:41:21)  

## 2016-09-20 NOTE — Progress Notes (Signed)
Hematology and Oncology Follow Up Visit  Richard [name] [id number] [birth year] 71 y.o. 09/20/2016 10:23 AM    Principle Diagnosis: 71 year old gentleman with prostate cancer diagnosed in December of 2005. He had a Gleason score 4+3 = 7 with a PSA of 3.66  Prior Therapy: 1. He is status post retropubic prostatectomy and bilateral lymph node dissection done on 11/25/2004. The pathology showed a Gleason score of 4+4 equals 8 with staging of T2b N0. His PSA nadir was at 0.12. 2. he is status post salvage radiation therapy done between March and May of 2007 for a rise in his PSA to 0.29. He received total of 65 gray in 35 fractions. 3. he subsequently had another rise in his PSA up to 1.52. He was treated with Lupron intermittently last treatment was in October of 2010 with a PSA nadir down to 0.08. Most recent PSA was up to 8.91.   Current therapy:   Lupron was restarted in April 2016 he is currently receiving 40 mg every 4 months.  Interim History: Richard [name] presents today for a followup visit with his wife. Since his last visit, he continues to do very well without any major changes. He tolerated Lupron very well without any major side effects. He does report very mild hot flashes and has been tolerated.  He denies any bone pain or urinary symptoms. He did report mild weight gain although he remains very active. Although retired from his job, continues to work outside the house without any decline.  He is not reporting any headaches, blurry vision or syncope or seizures. He is not reporting any chest pain or shortness of breath is not reporting any back pain is not reporting any genitourinary complaints. Does not report any fevers or chills or sweats. Does not report any chest pain or shortness of breath. As that report any nausea or vomiting. Did not report any frequency urgency or hesitancy. Rest of his review of systems unremarkable.  Medications: I have reviewed the patient's current  medications.   Allergies:  Allergies  Allergen Reactions  . Effexor Xr [Venlafaxine Hcl] Rash    Past Medical History, Surgical history, Social history, and Family History were reviewed and updated.  Marland Kitchen Physical Exam: Blood pressure (!) 127/53, pulse 69, temperature 97.8 F (36.6 C), temperature source Oral, resp. rate 18, height 6\' 2"  (1.88 m), weight 225 lb 12.8 oz (102.4 kg), SpO2 100 %. ECOG: 0 General appearance: Alert, elderly gentleman without distress. Head: Normocephalic, without obvious abnormality no oral thrush noted. Neck: no adenopathy Lymph nodes: Cervical, supraclavicular, and axillary nodes normal. Heart:regular rate and rhythm, S1, S2 normal, no murmur, click, rub or gallop Lung:chest clear, no wheezing, rales, normal symmetric air entry Abdomin: soft, non-tender, without masses or organomegaly or splenomegaly. EXT:no erythema, induration, or nodules Skin: No rashes or lesions noted.  Lab Results: Lab Results  Component Value Date   WBC 6.3 09/14/2016   HGB 11.9 (L) 09/14/2016   HCT 36.0 (L) 09/14/2016   MCV 95.7 09/14/2016   PLT 234 09/14/2016     Chemistry      Component Value Date/Time   NA 137 09/14/2016 1101   K 4.9 09/14/2016 1101   CL 104 07/06/2016 0807   CL 102 02/20/2013 1254   CO2 27 09/14/2016 1101   BUN 28.7 (H) 09/14/2016 1101   CREATININE 1.2 09/14/2016 1101      Component Value Date/Time   CALCIUM 9.6 09/14/2016 1101   ALKPHOS 60 09/14/2016 1101   AST  16 09/14/2016 1101   ALT 13 09/14/2016 1101   BILITOT 0.47 09/14/2016 1101       Results for Richard [name], Richard [name] (MRN [id number]) as of 09/20/2016 09:08  Ref. Range 09/14/2016 11:01  PSA Latest Ref Range: 0.0 - 4.0 ng/mL <0.1       Impression and Plan: 71 year old gentleman with:   1. Prostate cancer: Initially diagnosed in 2005 with Gleason score of 4+4 equals 8, PSA at the time diagnosed at 3.66. He has biochemically relapsing disease with PSA of  To 8.91. Staging workup  including a bone scan and CT scan showed no evidence of disease.  He is currently hormone deprivation only in the form of Lupron that was started in April 2016. His PSA on 09/14/2016 continues to be very low with castrate level testosterone.  Risks and benefits of continuing Lupron today were reviewed. These complications include weight gain, hyperlipidemia, impaired glucose tolerance, osteoporosis, cardiovascular complications among others associated with long-term androgen deprivation.  For the time being, he is agreeable to continue and we will proceed upon today and repeat in 4 months.  2. Diabetes mellitus: Continue to follow with Dr. Dwyane Dee. His hemoglobin A1c is 6.0.  3. Followup: In 4 months for his next Lupron injection.    [facility], MD 1/22/201810:23 AM

## 2016-09-20 NOTE — Telephone Encounter (Signed)
Appointments scheduled per 1/22 LOS. Patient given AVS report and calendars with future scheduled appointments.  °

## 2016-09-22 ENCOUNTER — Telehealth: Payer: Self-pay | Admitting: Endocrinology

## 2016-09-22 NOTE — Telephone Encounter (Signed)
Refill glimepiride (AMARYL) 2 MG tablet    [facility]  [phone]  Fax [phone]  All other medication sent to the [facility] clinic Dr Herold Harms [id number]  [place]  Fax # [phone]

## 2016-09-24 ENCOUNTER — Other Ambulatory Visit: Payer: Self-pay

## 2016-09-24 MED ORDER — GLIMEPIRIDE 2 MG PO TABS: ORAL_TABLET | ORAL | 0 refills | Status: DC

## 2016-09-24 NOTE — Telephone Encounter (Signed)
Ordered

## 2016-10-01 DIAGNOSIS — Z961 Presence of intraocular lens: Secondary | ICD-10-CM | POA: Diagnosis not present

## 2016-10-01 DIAGNOSIS — H2512 Age-related nuclear cataract, left eye: Secondary | ICD-10-CM | POA: Diagnosis not present

## 2016-10-01 DIAGNOSIS — H52223 Regular astigmatism, bilateral: Secondary | ICD-10-CM | POA: Diagnosis not present

## 2016-10-01 DIAGNOSIS — E119 Type 2 diabetes mellitus without complications: Secondary | ICD-10-CM | POA: Diagnosis not present

## 2016-10-01 DIAGNOSIS — H5212 Myopia, left eye: Secondary | ICD-10-CM | POA: Diagnosis not present

## 2016-10-01 DIAGNOSIS — Z7984 Long term (current) use of oral hypoglycemic drugs: Secondary | ICD-10-CM | POA: Diagnosis not present

## 2016-10-01 LAB — HM DIABETES EYE EXAM

## 2016-10-05 ENCOUNTER — Other Ambulatory Visit (INDEPENDENT_AMBULATORY_CARE_PROVIDER_SITE_OTHER): Payer: Medicare Other

## 2016-10-05 DIAGNOSIS — E119 Type 2 diabetes mellitus without complications: Secondary | ICD-10-CM

## 2016-10-05 LAB — COMPREHENSIVE METABOLIC PANEL
ALT: 14 U/L (ref 0–53)
AST: 15 U/L (ref 0–37)
Albumin: 4.1 g/dL (ref 3.5–5.2)
Alkaline Phosphatase: 44 U/L (ref 39–117)
BUN: 28 mg/dL — ABNORMAL HIGH (ref 6–23)
CALCIUM: 9.1 mg/dL (ref 8.4–10.5)
CHLORIDE: 105 meq/L (ref 96–112)
CO2: 30 meq/L (ref 19–32)
Creatinine, Ser: 1.13 mg/dL (ref 0.40–1.50)
GFR: 67.85 mL/min (ref >60.00)
GLUCOSE: 125 mg/dL — AB (ref 70–99)
Potassium: 4.4 mEq/L (ref 3.5–5.1)
Sodium: 138 mEq/L (ref 135–145)
Total Bilirubin: 0.4 mg/dL (ref 0.2–1.2)
Total Protein: 6.4 g/dL (ref 6.0–8.3)

## 2016-10-05 LAB — HEMOGLOBIN A1C: HEMOGLOBIN A1C: 6.5 % (ref 4.6–6.5)

## 2016-10-08 ENCOUNTER — Other Ambulatory Visit: Payer: Self-pay

## 2016-10-08 ENCOUNTER — Ambulatory Visit (INDEPENDENT_AMBULATORY_CARE_PROVIDER_SITE_OTHER): Payer: Medicare Other | Admitting: Endocrinology

## 2016-10-08 ENCOUNTER — Encounter: Payer: Self-pay | Admitting: Endocrinology

## 2016-10-08 VITALS — BP 124/72 | HR 67 | Ht 74.0 in | Wt 226.0 lb

## 2016-10-08 DIAGNOSIS — C61 Malignant neoplasm of prostate: Secondary | ICD-10-CM

## 2016-10-08 DIAGNOSIS — Z79899 Other long term (current) drug therapy: Secondary | ICD-10-CM

## 2016-10-08 DIAGNOSIS — Z8546 Personal history of malignant neoplasm of prostate: Secondary | ICD-10-CM

## 2016-10-08 DIAGNOSIS — E291 Testicular hypofunction: Secondary | ICD-10-CM

## 2016-10-08 DIAGNOSIS — Z5181 Encounter for therapeutic drug level monitoring: Secondary | ICD-10-CM | POA: Diagnosis not present

## 2016-10-08 DIAGNOSIS — E1165 Type 2 diabetes mellitus with hyperglycemia: Secondary | ICD-10-CM

## 2016-10-08 DIAGNOSIS — IMO0001 Reserved for inherently not codable concepts without codable children: Secondary | ICD-10-CM

## 2016-10-08 DIAGNOSIS — Z79818 Long term (current) use of other agents affecting estrogen receptors and estrogen levels: Secondary | ICD-10-CM

## 2016-10-08 MED ORDER — METFORMIN HCL 1000 MG PO TABS: ORAL_TABLET | ORAL | 0 refills | Status: DC

## 2016-10-08 MED ORDER — ATORVASTATIN CALCIUM 10 MG PO TABS: ORAL_TABLET | ORAL | 0 refills | Status: DC

## 2016-10-08 MED ORDER — RAMIPRIL 5 MG PO CAPS: ORAL_CAPSULE | ORAL | 2 refills | Status: DC

## 2016-10-08 MED ORDER — ONETOUCH DELICA LANCETS FINE MISC: 3 refills | Status: DC

## 2016-10-08 MED ORDER — GLIMEPIRIDE 2 MG PO TABS: ORAL_TABLET | ORAL | 0 refills | Status: DC

## 2016-10-08 MED ORDER — ESCITALOPRAM OXALATE 10 MG PO TABS: 10.0000 mg | ORAL_TABLET | Freq: Every day | ORAL | 3 refills | Status: DC

## 2016-10-08 MED ORDER — TRIAMTERENE-HCTZ 37.5-25 MG PO CAPS: 1.0000 | ORAL_CAPSULE | Freq: Every day | ORAL | 3 refills | Status: DC

## 2016-10-08 MED ORDER — PIOGLITAZONE HCL 30 MG PO TABS: ORAL_TABLET | ORAL | 0 refills | Status: DC

## 2016-10-08 MED ORDER — AMLODIPINE BESYLATE 5 MG PO TABS: ORAL_TABLET | ORAL | 0 refills | Status: DC

## 2016-10-08 MED ORDER — GLUCOSE BLOOD VI STRP: ORAL_STRIP | 1 refills | Status: DC

## 2016-10-08 NOTE — Patient Instructions (Signed)
Walk daily 

## 2016-10-08 NOTE — Addendum Note (Signed)
Addended by: Elayne Snare on: 10/08/2016 02:02 PM   Modules accepted: Orders

## 2016-10-08 NOTE — Progress Notes (Signed)
  Patient ID: Richard [name], male   DOB: 05/14/1946, 70 y.o.   MRN: [id number]   Reason for Appointment:  Follow-up  History of Present Illness   Problem 1:  DIABETES MELITUS, date of diagnosis 1995 with a glucose of 320 and HemoglobinA1c of 9.6 Previous history: He has been on various oral hypoglycemic drugs in the past but for the last few years has required multiple drugs in combination; diabetes has been generally well controlled with A1c near normal  Recent history:   Oral hypoglycemic drugs: Amaryl 2 mg once a day in a.m., metformin 1000 twice a day, Actos 15 mg daily  Current management, blood sugar patterns and problems identified:  His A1c is gradually increasing, now 6.5  He appears to have periodically high readings over 200 after meals  His wife says that he is not controlling portions, carbohydrates or sweets sometimes  Does not check fasting readings but lab glucose was 125  He is not doing much programmed exercise because of cold weather  However has been able to maintain his weight     Side effects from medications: None       Monitors blood glucose:  irregularly     Glucometer: One Touch ultra 2 .          Blood Glucose readings: MEDIAN 116  AFTER lunch 94-244 After dinner 192, bedtime 114   Hypoglycemia:  none                 Physical activity: exercise:  Occasional walking  Dietician visit: Most recent:? 2007   Weight control:      Wt Readings from Last 3 Encounters:  10/08/16 226 lb (102.5 kg)  09/20/16 225 lb 12.8 oz (102.4 kg)  07/09/16 226 lb (102.5 kg)    Diabetes labs:  Lab Results  Component Value Date   HGBA1C 6.5 10/05/2016   HGBA1C 6.0 07/06/2016   HGBA1C 5.7 02/23/2016   Lab Results  Component Value Date   MICROALBUR 1.9 07/09/2016   LDLCALC 70 07/06/2016   CREATININE 1.13 10/05/2016    Other problems: See review of systems   Lab on 10/05/2016  Component Date Value Ref Range Status  . Hgb A1c MFr Bld  10/05/2016 6.5  4.6 - 6.5 % Final  . Sodium 10/05/2016 138  135 - 145 mEq/L Final  . Potassium 10/05/2016 4.4  3.5 - 5.1 mEq/L Final  . Chloride 10/05/2016 105  96 - 112 mEq/L Final  . CO2 10/05/2016 30  19 - 32 mEq/L Final  . Glucose, Bld 10/05/2016 125* 70 - 99 mg/dL Final  . BUN 10/05/2016 28* 6 - 23 mg/dL Final  . Creatinine, Ser 10/05/2016 1.13  0.40 - 1.50 mg/dL Final  . Total Bilirubin 10/05/2016 0.4  0.2 - 1.2 mg/dL Final  . Alkaline Phosphatase 10/05/2016 44  39 - 117 U/L Final  . AST 10/05/2016 15  0 - 37 U/L Final  . ALT 10/05/2016 14  0 - 53 U/L Final  . Total Protein 10/05/2016 6.4  6.0 - 8.3 g/dL Final  . Albumin 10/05/2016 4.1  3.5 - 5.2 g/dL Final  . Calcium 10/05/2016 9.1  8.4 - 10.5 mg/dL Final  . GFR 10/05/2016 67.85  >60.00 mL/min Final     Allergies as of 10/08/2016      Reactions   Effexor Xr [venlafaxine Hcl] Rash      Medication List       Accurate as of 10/08/16 12:02 PM. Always use   your most recent med list.          amLODipine 5 MG tablet Commonly known as:  NORVASC TAKE 1 TABLET(5 MG) BY MOUTH DAILY   atorvastatin 10 MG tablet Commonly known as:  LIPITOR TAKE 1 TABLET(10 MG) BY MOUTH DAILY   escitalopram 10 MG tablet Commonly known as:  LEXAPRO Take 1 tablet (10 mg total) by mouth daily.   glimepiride 2 MG tablet Commonly known as:  AMARYL TAKE 1 TABLET(2 MG) BY MOUTH DAILY WITH BREAKFAST   glucose blood test strip Commonly known as:  ONE TOUCH ULTRA TEST Use as instructed to check blood sugar 3 times per week dx code E11.9   LUPRON DEPOT (82-MONTH) IM Inject into the muscle.   metFORMIN 1000 MG tablet Commonly known as:  GLUCOPHAGE TAKE 1 TABLET(1000 MG) BY MOUTH TWICE DAILY WITH A MEAL   ONE TOUCH ULTRA 2 w/Device Kit Use to check blood sugar 3 times per week dx code A16.5   ONETOUCH DELICA LANCETS FINE Misc Use to check blood sugar 3 times per week dx code E11.9   pioglitazone 30 MG tablet Commonly known as:  ACTOS TAKE 1  TABLET(30 MG) BY MOUTH DAILY   ramipril 5 MG capsule Commonly known as:  ALTACE TAKE 1 CAPSULE (10 MG TOTAL) BY MOUTH DAILY.   triamterene-hydrochlorothiazide 37.5-25 MG capsule Commonly known as:  DYAZIDE Take 1 each (1 capsule total) by mouth daily.       Allergies:  Allergies  Allergen Reactions  . Effexor Xr [Venlafaxine Hcl] Rash    Past Medical History:  Diagnosis Date  . Diabetes mellitus without complication ([place])   . Hypertension   . prostate ca dx'd 2006   prostatectomy and xrt    No past surgical history on file.  Family History  Problem Relation Age of Onset  . Hypertension Father   . Heart disease Neg Hx   . Diabetes Neg Hx     Social History:  reports that he has been smoking.  He has never used smokeless tobacco. His alcohol and drug histories are not on file.    REVIEW OF SYSTEMS:       Hypertension:  blood pressure has been  well controlled with 5 mg ramipril and Dyazide, also on amlodipine 5 mg.  Does  monitor at home occasionally   Lipids:  he has been taking Lipitor 10 mg LDL is controlled as below   Lab Results  Component Value Date   CHOL 164 07/06/2016   HDL 63.40 07/06/2016   LDLCALC 70 07/06/2016   TRIG 154.0 (H) 07/06/2016   CHOLHDL 3 07/06/2016     History of vitamin B12 deficiency in the past causing anemia.   Lab Results  Component Value Date   WBC 6.3 09/14/2016   HGB 11.9 (L) 09/14/2016   HCT 36.0 (L) 09/14/2016   MCV 95.7 09/14/2016   PLT 234 09/14/2016     He is taking Lexapro for depression   LABS:  Lab on 10/05/2016  Component Date Value Ref Range Status  . Hgb A1c MFr Bld 10/05/2016 6.5  4.6 - 6.5 % Final  . Sodium 10/05/2016 138  135 - 145 mEq/L Final  . Potassium 10/05/2016 4.4  3.5 - 5.1 mEq/L Final  . Chloride 10/05/2016 105  96 - 112 mEq/L Final  . CO2 10/05/2016 30  19 - 32 mEq/L Final  . Glucose, Bld 10/05/2016 125* 70 - 99 mg/dL Final  . BUN 10/05/2016 28* 6 - 23  mg/dL Final  . Creatinine,  Ser 10/05/2016 1.13  0.40 - 1.50 mg/dL Final  . Total Bilirubin 10/05/2016 0.4  0.2 - 1.2 mg/dL Final  . Alkaline Phosphatase 10/05/2016 44  39 - 117 U/L Final  . AST 10/05/2016 15  0 - 37 U/L Final  . ALT 10/05/2016 14  0 - 53 U/L Final  . Total Protein 10/05/2016 6.4  6.0 - 8.3 g/dL Final  . Albumin 10/05/2016 4.1  3.5 - 5.2 g/dL Final  . Calcium 10/05/2016 9.1  8.4 - 10.5 mg/dL Final  . GFR 10/05/2016 67.85  >60.00 mL/min Final     Examination:   BP 124/72   Pulse 67   Ht 6' 2" (1.88 m)   Wt 226 lb (102.5 kg)   SpO2 94%   BMI 29.02 kg/m   Body mass index is 29.02 kg/m.      ASSESSMENT/ PLAN:    Diabetes type 2 with mild obesity:   See history of present illness for detailed discussion of current diabetes management, blood sugar patterns and problems identified   Although his blood sugars are reasonably well controlled His A1c is increasing relatively Has periodic high postprandial readings from poor diet He does not understand what blood sugar targets should be Also can do better with the regular exercise now with weather improving  Encouraged him to check Blood sugars after meals more regularly to help him comply with diet better Discussed blood sugar targets Will not change his regimen as yet, consider Januvia on the next visit   Hypertension: Well controlled   Vitamin B12 deficiency:  He will continue 1000 g daily   ANEMIA: His hemoglobin is near normal To continue follow-up with PCP  , 10/08/2016, 12:02 PM      

## 2016-10-11 ENCOUNTER — Telehealth: Payer: Self-pay

## 2016-10-11 NOTE — Telephone Encounter (Signed)
Pt is in need of more test strips please and the device needs to be called in he does believe his current one may not be working  To [facility] in [facility] [facility]

## 2016-10-13 ENCOUNTER — Other Ambulatory Visit: Payer: Self-pay

## 2016-10-13 MED ORDER — GLUCOSE BLOOD VI STRP: ORAL_STRIP | 1 refills | Status: DC

## 2016-10-13 NOTE — Telephone Encounter (Signed)
Ordered 10/13/16

## 2016-10-14 ENCOUNTER — Inpatient Hospital Stay: Admission: RE | Admit: 2016-10-14 | Payer: Medicare Other | Source: Ambulatory Visit

## 2017-01-14 ENCOUNTER — Other Ambulatory Visit: Payer: Self-pay | Admitting: Endocrinology

## 2017-01-18 ENCOUNTER — Other Ambulatory Visit (HOSPITAL_BASED_OUTPATIENT_CLINIC_OR_DEPARTMENT_OTHER): Payer: Medicare Other

## 2017-01-18 DIAGNOSIS — Z8546 Personal history of malignant neoplasm of prostate: Secondary | ICD-10-CM | POA: Diagnosis not present

## 2017-01-18 DIAGNOSIS — C61 Malignant neoplasm of prostate: Secondary | ICD-10-CM | POA: Diagnosis present

## 2017-01-18 DIAGNOSIS — E119 Type 2 diabetes mellitus without complications: Secondary | ICD-10-CM | POA: Diagnosis not present

## 2017-01-18 LAB — CBC WITH DIFFERENTIAL/PLATELET
BASO%: 0.6 % (ref 0.0–2.0)
Basophils Absolute: 0 10*3/uL (ref 0.0–0.1)
EOS%: 0.9 % (ref 0.0–7.0)
Eosinophils Absolute: 0.1 10*3/uL (ref 0.0–0.5)
HCT: 35 % — ABNORMAL LOW (ref 38.4–49.9)
HEMOGLOBIN: 12 g/dL — AB (ref 13.0–17.1)
LYMPH%: 21.8 % (ref 14.0–49.0)
MCH: 32.9 pg (ref 27.2–33.4)
MCHC: 34.3 g/dL (ref 32.0–36.0)
MCV: 95.8 fL (ref 79.3–98.0)
MONO#: 0.7 10*3/uL (ref 0.1–0.9)
MONO%: 10.3 % (ref 0.0–14.0)
NEUT%: 66.4 % (ref 39.0–75.0)
NEUTROS ABS: 4.4 10*3/uL (ref 1.5–6.5)
Platelets: 224 10*3/uL (ref 140–400)
RBC: 3.65 10*6/uL — ABNORMAL LOW (ref 4.20–5.82)
RDW: 12.9 % (ref 11.0–14.6)
WBC: 6.7 10*3/uL (ref 4.0–10.3)
lymph#: 1.5 10*3/uL (ref 0.9–3.3)

## 2017-01-18 LAB — COMPREHENSIVE METABOLIC PANEL
ALBUMIN: 4 g/dL (ref 3.5–5.0)
ALK PHOS: 61 U/L (ref 40–150)
ALT: 15 U/L (ref 0–55)
AST: 19 U/L (ref 5–34)
Anion Gap: 9 mEq/L (ref 3–11)
BUN: 19.3 mg/dL (ref 7.0–26.0)
CALCIUM: 9.6 mg/dL (ref 8.4–10.4)
CO2: 27 mEq/L (ref 22–29)
CREATININE: 1.2 mg/dL (ref 0.7–1.3)
Chloride: 102 mEq/L (ref 98–109)
EGFR: 58 mL/min/{1.73_m2} — ABNORMAL LOW (ref >90)
GLUCOSE: 123 mg/dL (ref 70–140)
Potassium: 4.7 mEq/L (ref 3.5–5.1)
SODIUM: 139 meq/L (ref 136–145)
Total Bilirubin: 0.59 mg/dL (ref 0.20–1.20)
Total Protein: 6.6 g/dL (ref 6.4–8.3)

## 2017-01-19 LAB — PSA: Prostate Specific Ag, Serum: <0.1 ng/mL (ref 0.0–4.0)

## 2017-01-21 ENCOUNTER — Telehealth: Payer: Self-pay | Admitting: Oncology

## 2017-01-21 ENCOUNTER — Ambulatory Visit (HOSPITAL_BASED_OUTPATIENT_CLINIC_OR_DEPARTMENT_OTHER): Payer: Medicare Other | Admitting: Oncology

## 2017-01-21 ENCOUNTER — Ambulatory Visit (HOSPITAL_BASED_OUTPATIENT_CLINIC_OR_DEPARTMENT_OTHER): Payer: Medicare Other

## 2017-01-21 VITALS — BP 119/57 | HR 75 | Temp 98.4°F | Resp 18 | Ht 74.0 in | Wt 220.5 lb

## 2017-01-21 DIAGNOSIS — Z5111 Encounter for antineoplastic chemotherapy: Secondary | ICD-10-CM

## 2017-01-21 DIAGNOSIS — Z8546 Personal history of malignant neoplasm of prostate: Secondary | ICD-10-CM

## 2017-01-21 DIAGNOSIS — E119 Type 2 diabetes mellitus without complications: Secondary | ICD-10-CM | POA: Diagnosis not present

## 2017-01-21 DIAGNOSIS — C61 Malignant neoplasm of prostate: Secondary | ICD-10-CM

## 2017-01-21 MED ORDER — LEUPROLIDE ACETATE (4 MONTH) 30 MG IM KIT
30.0000 mg | PACK | Freq: Once | INTRAMUSCULAR | Status: AC
  Administered 2017-01-21: 30 mg via INTRAMUSCULAR
  Filled 2017-01-21: qty 30

## 2017-01-21 NOTE — Telephone Encounter (Signed)
Gave patient AVS and calender per 5/25 LOS.  

## 2017-01-21 NOTE — Progress Notes (Signed)
Hematology and Oncology Follow Up Visit  Richard [name] [id number] [birth year] 71 y.o. 01/21/2017 9:40 AM    Principle Diagnosis: 71 year old gentleman with prostate cancer diagnosed in December of 2005. He had a Gleason score 4+3 = 7 with a PSA of 3.66  Prior Therapy: 1. He is status post retropubic prostatectomy and bilateral lymph node dissection done on 11/25/2004. The pathology showed a Gleason score of 4+4 equals 8 with staging of T2b N0. His PSA nadir was at 0.12. 2. he is status post salvage radiation therapy done between March and May of 2007 for a rise in his PSA to 0.29. He received total of 65 gray in 35 fractions. 3. he subsequently had another rise in his PSA up to 1.52. He was treated with Lupron intermittently last treatment was in October of 2010 with a PSA nadir down to 0.08. Most recent PSA was up to 8.91.   Current therapy:   Lupron was restarted in April 2016 he is currently receiving 40 mg every 4 months.  Interim History: Richard [name] presents today for a followup visit with his wife. Since his last visit, he reports no recent complaints. He tolerated Lupron very well without any major side effects. He does report very mild hot flashes and has been tolerated. He denied any weight gain, excessive fatigue or lethargy. Although he is retired, he continues to be active without any physical limitations. He denies any bone pain or urinary symptoms. His performance status and quality of life remains excellent.  He is not reporting any headaches, blurry vision or syncope or seizures. He is not reporting any chest pain or shortness of breath is not reporting any back pain is not reporting any genitourinary complaints. Does not report any fevers or chills or sweats. Does not report any chest pain or shortness of breath. As that report any nausea or vomiting. Did not report any frequency urgency or hesitancy. Rest of his review of systems unremarkable.  Medications: I have reviewed  the patient's current medications.   Allergies:  Allergies  Allergen Reactions  . Effexor Xr [Venlafaxine Hcl] Rash    Past Medical History, Surgical history, Social history, and Family History were reviewed and updated.  Marland Kitchen Physical Exam: Blood pressure (!) 119/57, pulse 75, temperature 98.4 F (36.9 C), temperature source Oral, resp. rate 18, height 6\' 2"  (1.88 m), weight 220 lb 8 oz (100 kg), SpO2 99 %. ECOG: 0 General appearance: Well-appearing gentleman appeared without distress. Head: Normocephalic, without obvious abnormality no oral ulcers or lesions. Neck: no adenopathy Lymph nodes: Cervical, supraclavicular, and axillary nodes normal. Heart:regular rate and rhythm, S1, S2 normal, no murmur, click, rub or gallop Lung:chest clear, no wheezing, rales, normal symmetric air entry Abdomin: soft, non-tender, without masses or organomegaly. No rebound or guarding. EXT:no erythema, induration, or nodules Skin: No rashes or lesions noted.  Lab Results: Lab Results  Component Value Date   WBC 6.7 01/18/2017   HGB 12.0 (L) 01/18/2017   HCT 35.0 (L) 01/18/2017   MCV 95.8 01/18/2017   PLT 224 01/18/2017     Chemistry      Component Value Date/Time   NA 139 01/18/2017 0947   K 4.7 01/18/2017 0947   CL 105 10/05/2016 0828   CL 102 02/20/2013 1254   CO2 27 01/18/2017 0947   BUN 19.3 01/18/2017 0947   CREATININE 1.2 01/18/2017 0947      Component Value Date/Time   CALCIUM 9.6 01/18/2017 0947   ALKPHOS 61 01/18/2017 0947  AST 19 01/18/2017 0947   ALT 15 01/18/2017 0947   BILITOT 0.59 01/18/2017 0947       Results for Richard [name] (MRN [id number]) as of 09/20/2016 09:08  Ref. Range 09/14/2016 11:01  PSA Latest Ref Range: 0.0 - 4.0 ng/mL <0.1       Impression and Plan: 71 year old gentleman with:   1. Prostate cancer: Initially diagnosed in 2005 with Gleason score of 4+4 equals 8, PSA at the time diagnosed at 3.66. He has biochemically relapsing disease with  PSA of  To 8.91. Staging workup including a bone scan and CT scan showed no evidence of disease.  He is currently hormone deprivation only in the form of Lupron that was started in April 2016.   His PSA on 01/18/2017 continues to be very low close to undetectable.  Risks and benefits of continuing Lupron today were reviewed. These complications include weight gain, hyperlipidemia, impaired glucose tolerance, osteoporosis, cardiovascular complications among others associated with long-term androgen deprivation.  After discussion today, he is agreeable to continue.  2. Diabetes mellitus: Continue to follow with Dr. Dwyane Dee. His diabetes continues to be under reasonable control despite androgen deprivation.  3. Followup: In 4 months for his next Lupron injection.    [facility], MD 5/25/20189:40 AM

## 2017-01-21 NOTE — Patient Instructions (Signed)
Leuprolide depot injection or implant What is this medicine? LEUPROLIDE (loo PROE lide) is a man-made protein that acts like a natural hormone in the body. It decreases testosterone in men and decreases estrogen in women. In men, this medicine is used to treat advanced prostate cancer. In women, some forms of this medicine may be used to treat endometriosis, uterine fibroids, or other male hormone-related problems. This medicine may be used for other purposes; ask your health care provider or pharmacist if you have questions. COMMON BRAND NAME(S): Eligard, Lupron Depot, Lupron Depot-Ped, Viadur What should I tell my health care provider before I take this medicine? They need to know if you have any of these conditions: -diabetes -heart disease or previous heart attack -high blood pressure -high cholesterol -osteoporosis -pain or difficulty passing urine -spinal cord metastasis -stroke -tobacco smoker -unusual vaginal bleeding (women) -an unusual or allergic reaction to leuprolide, benzyl alcohol, other medicines, foods, dyes, or preservatives -pregnant or trying to get pregnant -breast-feeding How should I use this medicine? This medicine is for injection into a muscle or for implant or injection under the skin. It is given by a health care professional in a hospital or clinic setting. The specific product will determine how it will be given to you. Make sure you understand which product you receive and how often you will receive it. Talk to your pediatrician regarding the use of this medicine in children. Special care may be needed. Overdosage: If you think you have taken too much of this medicine contact a poison control center or emergency room at once. NOTE: This medicine is only for you. Do not share this medicine with others. What if I miss a dose? It is important not to miss a dose. Call your doctor or health care professional if you are unable to keep an appointment. Depot  injections: Depot injections are given either once-monthly, every 12 weeks, every 16 weeks, or every 24 weeks depending on the product you are prescribed. The product you are prescribed will be based on if you are male or male, and your condition. Make sure you understand your product and dosing. Implant dosing: The implant is removed and replaced once a year. The implant is only used in males. What may interact with this medicine? Do not take this medicine with any of the following medications: -chasteberry This medicine may also interact with the following medications: -herbal or dietary supplements, like black cohosh or DHEA -male hormones, like estrogens or progestins and birth control pills, patches, rings, or injections -male hormones, like testosterone This list may not describe all possible interactions. Give your health care provider a list of all the medicines, herbs, non-prescription drugs, or dietary supplements you use. Also tell them if you smoke, drink alcohol, or use illegal drugs. Some items may interact with your medicine. What should I watch for while using this medicine? Visit your doctor or health care professional for regular checks on your progress. During the first weeks of treatment, your symptoms may get worse, but then will improve as you continue your treatment. You may get hot flashes, increased bone pain, increased difficulty passing urine, or an aggravation of nerve symptoms. Discuss these effects with your doctor or health care professional, some of them may improve with continued use of this medicine. Male patients may experience a menstrual cycle or spotting during the first months of therapy with this medicine. If this continues, contact your doctor or health care professional. What side effects may I notice from   receiving this medicine? Side effects that you should report to your doctor or health care professional as soon as possible: -allergic reactions like  skin rash, itching or hives, swelling of the face, lips, or tongue -breathing problems -chest pain -depression or memory disorders -pain in your legs or groin -pain at site where injected or implanted -severe headache -swelling of the feet and legs -visual changes -vomiting Side effects that usually do not require medical attention (report to your doctor or health care professional if they continue or are bothersome): -breast swelling or tenderness -decrease in sex drive or performance -diarrhea -hot flashes -loss of appetite -muscle, joint, or bone pains -nausea -redness or irritation at site where injected or implanted -skin problems or acne This list may not describe all possible side effects. Call your doctor for medical advice about side effects. You may report side effects to FDA at 1-800-FDA-1088. Where should I keep my medicine? This drug is given in a hospital or clinic and will not be stored at home. NOTE: This sheet is a summary. It may not cover all possible information. If you have questions about this medicine, talk to your doctor, pharmacist, or health care provider.  2015, [employer] Standard. (2010-02-17 14:41:21)  

## 2017-01-25 ENCOUNTER — Other Ambulatory Visit: Payer: Self-pay | Admitting: Endocrinology

## 2017-01-27 DIAGNOSIS — H9312 Tinnitus, left ear: Secondary | ICD-10-CM | POA: Diagnosis not present

## 2017-01-27 DIAGNOSIS — H8102 Meniere's disease, left ear: Secondary | ICD-10-CM | POA: Diagnosis not present

## 2017-01-27 DIAGNOSIS — H903 Sensorineural hearing loss, bilateral: Secondary | ICD-10-CM | POA: Diagnosis not present

## 2017-02-04 ENCOUNTER — Other Ambulatory Visit (INDEPENDENT_AMBULATORY_CARE_PROVIDER_SITE_OTHER): Payer: Medicare Other

## 2017-02-04 DIAGNOSIS — E1165 Type 2 diabetes mellitus with hyperglycemia: Secondary | ICD-10-CM

## 2017-02-04 LAB — HEMOGLOBIN A1C: Hgb A1c MFr Bld: 6.5 % (ref 4.6–6.5)

## 2017-02-04 LAB — BASIC METABOLIC PANEL
BUN: 36 mg/dL — AB (ref 6–23)
CALCIUM: 9.6 mg/dL (ref 8.4–10.5)
CO2: 28 mEq/L (ref 19–32)
CREATININE: 1.45 mg/dL (ref 0.40–1.50)
Chloride: 102 mEq/L (ref 96–112)
GFR: 50.84 mL/min — AB (ref >60.00)
Glucose, Bld: 109 mg/dL — ABNORMAL HIGH (ref 70–99)
Potassium: 4.8 mEq/L (ref 3.5–5.1)
Sodium: 135 mEq/L (ref 135–145)

## 2017-02-07 NOTE — Progress Notes (Signed)
Patient ID: Richard [name], male   DOB: April 04, 1946, 71 y.o.   MRN: [id number]   Reason for Appointment:  Follow-up  History of Present Illness   Problem 1:  DIABETES MELITUS, date of diagnosis 1995 with a glucose of 320 and HemoglobinA1c of 9.6 Previous history: He has been on various oral hypoglycemic drugs in the past but for the last few years has required multiple drugs in combination; diabetes has been generally well controlled with A1c near normal  Recent history:   Oral hypoglycemic drugs: Amaryl 2 mg once a day in a.m., metformin 1000 twice a day, Actos 15 mg daily  Current management, blood sugar patterns and problems identified:  His A1c is still about the same at 6.5, previously has been around 6%  He has not checked enough readings after meals as directed and none after supper  He has a couple of high readings on his meter and these are either related to more carbohydrate and once after eating cake  Previously was advised to cut back on portions, carbohydrates and high-fat foods, he does go out for eating breakfast more regularly  With his being more active and deciding he has lost some weight  Fasting lab glucose was 109  Side effects from medications: None       Monitors blood glucose:  irregularly     Glucometer: One Touch ultra 2 .          Blood Glucose readings: MEDIAN 149 overall  After breakfast 171 Lunchtime 118-157 and after lunch 100-218   Hypoglycemia:  none                 Physical activity: exercise:  Walking/elliptical and yardwork regularly  Dietician visit: Most recent:? 2007   Weight control:      Wt Readings from Last 3 Encounters:  02/08/17 219 lb 9.6 oz (99.6 kg)  01/21/17 220 lb 8 oz (100 kg)  10/08/16 226 lb (102.5 kg)    Diabetes labs:  Lab Results  Component Value Date   HGBA1C 6.5 02/04/2017   HGBA1C 6.5 10/05/2016   HGBA1C 6.0 07/06/2016   Lab Results  Component Value Date   MICROALBUR 1.9 07/09/2016   LDLCALC 70 07/06/2016   CREATININE 1.45 02/04/2017    Other problems: See review of systems   Lab on 02/04/2017  Component Date Value Ref Range Status  . Hgb A1c MFr Bld 02/04/2017 6.5  4.6 - 6.5 % Final   Glycemic Control Guidelines for People with Diabetes:Non Diabetic:  <6%Goal of Therapy: <7%Additional Action Suggested:  >8%   . Sodium 02/04/2017 135  135 - 145 mEq/L Final  . Potassium 02/04/2017 4.8  3.5 - 5.1 mEq/L Final  . Chloride 02/04/2017 102  96 - 112 mEq/L Final  . CO2 02/04/2017 28  19 - 32 mEq/L Final  . Glucose, Bld 02/04/2017 109* 70 - 99 mg/dL Final  . BUN 02/04/2017 36* 6 - 23 mg/dL Final  . Creatinine, Ser 02/04/2017 1.45  0.40 - 1.50 mg/dL Final  . Calcium 02/04/2017 9.6  8.4 - 10.5 mg/dL Final  . GFR 02/04/2017 50.84* >60.00 mL/min Final     Allergies as of 02/08/2017      Reactions   Effexor Xr [venlafaxine Hcl] Rash      Medication List       Accurate as of 02/08/17  8:12 AM. Always use your most recent med list.          amLODipine 5 MG tablet  Commonly known as:  NORVASC TAKE 1 TABLET(5 MG) BY MOUTH DAILY   atorvastatin 10 MG tablet Commonly known as:  LIPITOR TAKE 1 TABLET(10 MG) BY MOUTH DAILY   escitalopram 10 MG tablet Commonly known as:  LEXAPRO Take 1 tablet (10 mg total) by mouth daily.   glimepiride 2 MG tablet Commonly known as:  AMARYL TAKE 1 TABLET (2 MG TOTAL) BY MOUTH DAILY WITH BREAKFAST.   glucose blood test strip Commonly known as:  ONE TOUCH ULTRA TEST Use as instructed to check blood sugar 3 times per week dx code E11.9   LUPRON DEPOT (28-MONTH) IM Inject into the muscle.   metFORMIN 1000 MG tablet Commonly known as:  GLUCOPHAGE TAKE 1 TABLET(1000 MG) BY MOUTH TWICE DAILY WITH A MEAL   ONE TOUCH ULTRA 2 w/Device Kit Use to check blood sugar 3 times per week dx code D05.1   ONETOUCH DELICA LANCETS FINE Misc Use to check blood sugar 3 times per week dx code E11.9   pioglitazone 30 MG tablet Commonly known as:   ACTOS TAKE 1 TABLET(30 MG) BY MOUTH DAILY   ramipril 5 MG capsule Commonly known as:  ALTACE TAKE 1 CAPSULE (10 MG TOTAL) BY MOUTH DAILY.   triamterene-hydrochlorothiazide 37.5-25 MG capsule Commonly known as:  DYAZIDE Take 1 each (1 capsule total) by mouth daily.       Allergies:  Allergies  Allergen Reactions  . Effexor Xr [Venlafaxine Hcl] Rash    Past Medical History:  Diagnosis Date  . Diabetes mellitus without complication ([place])   . Hypertension   . prostate ca dx'd 2006   prostatectomy and xrt    No past surgical history on file.  Family History  Problem Relation Age of Onset  . Hypertension Father   . Heart disease Neg Hx   . Diabetes Neg Hx     Social History:  reports that he has been smoking.  He has never used smokeless tobacco. His alcohol and drug histories are not on file.    REVIEW OF SYSTEMS:       Hypertension:  blood pressure has been  well controlled with 5 mg ramipril and Dyazide, also on amlodipine 5 mg.  Not clear why he appears to be taking ramipril twice a day even though he has been prescribed this once a day only He does occasionally feel lightheaded  Does  monitor at home occasionally: Systolic blood pressure has been as low as 105  BP Readings from Last 3 Encounters:  02/08/17 106/64  01/21/17 (!) 119/57  10/08/16 124/72    Lipids:  he has been taking Lipitor 10 mg LDL is controlled as below   Lab Results  Component Value Date   CHOL 164 07/06/2016   HDL 63.40 07/06/2016   LDLCALC 70 07/06/2016   TRIG 154.0 (H) 07/06/2016   CHOLHDL 3 07/06/2016     History of vitamin B12 deficiency And he is taking supplement CBC followed by oncologist also  Lab Results  Component Value Date   WBC 6.7 01/18/2017   HGB 12.0 (L) 01/18/2017   HCT 35.0 (L) 01/18/2017   MCV 95.8 01/18/2017   PLT 224 01/18/2017     He is taking Lexapro for depression   LABS:  Lab on 02/04/2017  Component Date Value Ref Range Status  . Hgb  A1c MFr Bld 02/04/2017 6.5  4.6 - 6.5 % Final   Glycemic Control Guidelines for People with Diabetes:Non Diabetic:  <6%Goal of Therapy: <7%Additional Action Suggested:  >  8%   . Sodium 02/04/2017 135  135 - 145 mEq/L Final  . Potassium 02/04/2017 4.8  3.5 - 5.1 mEq/L Final  . Chloride 02/04/2017 102  96 - 112 mEq/L Final  . CO2 02/04/2017 28  19 - 32 mEq/L Final  . Glucose, Bld 02/04/2017 109* 70 - 99 mg/dL Final  . BUN 02/04/2017 36* 6 - 23 mg/dL Final  . Creatinine, Ser 02/04/2017 1.45  0.40 - 1.50 mg/dL Final  . Calcium 02/04/2017 9.6  8.4 - 10.5 mg/dL Final  . GFR 02/04/2017 50.84* >60.00 mL/min Final     Examination:   BP 106/64   Pulse 89   Ht '6\' 2"'  (1.88 m)   Wt 219 lb 9.6 oz (99.6 kg)   SpO2 99%   BMI 28.19 kg/m   Body mass index is 28.19 kg/m.      ASSESSMENT/ PLAN:    Diabetes type 2 with mild obesity:   See history of present illness for detailed discussion of current diabetes management, blood sugar patterns and problems identified   He still has tendency to some high postprandial readings based on his diet and A1c is still higher than his usual levels Also not checking sugars after supper He may be getting an balanced meals especially when eating cereal in the morning and sometimes eating sweets He is however losing weight with more exercise now  Will not change his regimen  He will need to check more readings after breakfast and consider adding protein with his cereal and also check blood sugars when he is eating out Follow-up in 3 months  Hypertension: Blood pressure is unusually low, he appears to be taking ramipril twice a day instead of once as prescribed He is also lightheaded at times He will need to reduce ramipril to once a day  Renal dysfunction: This is likely to be from lower blood pressure and excessive dose of ramipril    Tenasia Aull 02/08/2017, 8:12 AM

## 2017-02-08 ENCOUNTER — Ambulatory Visit (INDEPENDENT_AMBULATORY_CARE_PROVIDER_SITE_OTHER): Payer: Medicare Other | Admitting: Endocrinology

## 2017-02-08 ENCOUNTER — Encounter: Payer: Self-pay | Admitting: Endocrinology

## 2017-02-08 VITALS — BP 106/64 | HR 89 | Ht 74.0 in | Wt 219.6 lb

## 2017-02-08 DIAGNOSIS — E1165 Type 2 diabetes mellitus with hyperglycemia: Secondary | ICD-10-CM | POA: Diagnosis not present

## 2017-02-08 NOTE — Patient Instructions (Addendum)
More sugars after meals, may need protein with cereal  Take only one capsule Ramipril instead of 2

## 2017-02-26 ENCOUNTER — Other Ambulatory Visit: Payer: Self-pay | Admitting: Endocrinology

## 2017-03-01 ENCOUNTER — Other Ambulatory Visit (INDEPENDENT_AMBULATORY_CARE_PROVIDER_SITE_OTHER): Payer: Medicare Other

## 2017-03-01 DIAGNOSIS — E1165 Type 2 diabetes mellitus with hyperglycemia: Secondary | ICD-10-CM | POA: Diagnosis not present

## 2017-03-01 LAB — BASIC METABOLIC PANEL
BUN: 26 mg/dL — ABNORMAL HIGH (ref 6–23)
CHLORIDE: 105 meq/L (ref 96–112)
CO2: 28 meq/L (ref 19–32)
Calcium: 9.4 mg/dL (ref 8.4–10.5)
Creatinine, Ser: 1.41 mg/dL (ref 0.40–1.50)
GFR: 52.5 mL/min — ABNORMAL LOW (ref >60.00)
Glucose, Bld: 127 mg/dL — ABNORMAL HIGH (ref 70–99)
Potassium: 4.6 mEq/L (ref 3.5–5.1)
SODIUM: 138 meq/L (ref 135–145)

## 2017-03-06 NOTE — Progress Notes (Signed)
Please call to let patient know that the kidney test is slightly better, if blood pressure is not normal needs to be seen for follow-up sooner

## 2017-05-01 DIAGNOSIS — M436 Torticollis: Secondary | ICD-10-CM | POA: Diagnosis not present

## 2017-05-01 DIAGNOSIS — R252 Cramp and spasm: Secondary | ICD-10-CM | POA: Diagnosis not present

## 2017-05-24 ENCOUNTER — Other Ambulatory Visit (HOSPITAL_BASED_OUTPATIENT_CLINIC_OR_DEPARTMENT_OTHER): Payer: Medicare Other

## 2017-05-24 DIAGNOSIS — C61 Malignant neoplasm of prostate: Secondary | ICD-10-CM | POA: Diagnosis not present

## 2017-05-24 LAB — CBC WITH DIFFERENTIAL/PLATELET
BASO%: 0.6 % (ref 0.0–2.0)
Basophils Absolute: 0 10*3/uL (ref 0.0–0.1)
EOS ABS: 0.1 10*3/uL (ref 0.0–0.5)
EOS%: 2 % (ref 0.0–7.0)
HCT: 34.4 % — ABNORMAL LOW (ref 38.4–49.9)
HGB: 11.1 g/dL — ABNORMAL LOW (ref 13.0–17.1)
LYMPH%: 29.4 % (ref 14.0–49.0)
MCH: 31.4 pg (ref 27.2–33.4)
MCHC: 32.3 g/dL (ref 32.0–36.0)
MCV: 97.5 fL (ref 79.3–98.0)
MONO#: 0.8 10*3/uL (ref 0.1–0.9)
MONO%: 12.5 % (ref 0.0–14.0)
NEUT%: 55.5 % (ref 39.0–75.0)
NEUTROS ABS: 3.7 10*3/uL (ref 1.5–6.5)
PLATELETS: 220 10*3/uL (ref 140–400)
RBC: 3.53 10*6/uL — AB (ref 4.20–5.82)
RDW: 12.7 % (ref 11.0–14.6)
WBC: 6.7 10*3/uL (ref 4.0–10.3)
lymph#: 2 10*3/uL (ref 0.9–3.3)

## 2017-05-24 LAB — COMPREHENSIVE METABOLIC PANEL
ALT: 9 U/L (ref 0–55)
ANION GAP: 8 meq/L (ref 3–11)
AST: 14 U/L (ref 5–34)
Albumin: 3.6 g/dL (ref 3.5–5.0)
Alkaline Phosphatase: 60 U/L (ref 40–150)
BILIRUBIN TOTAL: 0.41 mg/dL (ref 0.20–1.20)
BUN: 24.7 mg/dL (ref 7.0–26.0)
CHLORIDE: 107 meq/L (ref 98–109)
CO2: 25 meq/L (ref 22–29)
Calcium: 9.3 mg/dL (ref 8.4–10.4)
Creatinine: 1.2 mg/dL (ref 0.7–1.3)
EGFR: 58 mL/min/{1.73_m2} — AB (ref >90)
Glucose: 98 mg/dl (ref 70–140)
POTASSIUM: 4.4 meq/L (ref 3.5–5.1)
SODIUM: 140 meq/L (ref 136–145)
TOTAL PROTEIN: 6.5 g/dL (ref 6.4–8.3)

## 2017-05-25 LAB — PSA: Prostate Specific Ag, Serum: <0.1 ng/mL (ref 0.0–4.0)

## 2017-05-26 ENCOUNTER — Ambulatory Visit (HOSPITAL_BASED_OUTPATIENT_CLINIC_OR_DEPARTMENT_OTHER): Payer: Medicare Other

## 2017-05-26 ENCOUNTER — Telehealth: Payer: Self-pay | Admitting: Oncology

## 2017-05-26 ENCOUNTER — Ambulatory Visit (HOSPITAL_BASED_OUTPATIENT_CLINIC_OR_DEPARTMENT_OTHER): Payer: Medicare Other | Admitting: Oncology

## 2017-05-26 VITALS — BP 124/61 | HR 69 | Temp 98.2°F | Resp 19 | Ht 74.0 in | Wt 223.0 lb

## 2017-05-26 DIAGNOSIS — Z5111 Encounter for antineoplastic chemotherapy: Secondary | ICD-10-CM | POA: Diagnosis present

## 2017-05-26 DIAGNOSIS — C61 Malignant neoplasm of prostate: Secondary | ICD-10-CM | POA: Diagnosis not present

## 2017-05-26 DIAGNOSIS — E119 Type 2 diabetes mellitus without complications: Secondary | ICD-10-CM | POA: Diagnosis not present

## 2017-05-26 DIAGNOSIS — Z8546 Personal history of malignant neoplasm of prostate: Secondary | ICD-10-CM

## 2017-05-26 MED ORDER — LEUPROLIDE ACETATE (4 MONTH) 30 MG IM KIT
30.0000 mg | PACK | Freq: Once | INTRAMUSCULAR | Status: AC
  Administered 2017-05-26: 30 mg via INTRAMUSCULAR
  Filled 2017-05-26: qty 30

## 2017-05-26 NOTE — Telephone Encounter (Signed)
Scheduled appt per 9/27 los - Gave patient AVS and calender per los.  

## 2017-05-26 NOTE — Addendum Note (Signed)
Addended by: Randolm Idol on: 05/26/2017 09:19 AM   Modules accepted: Orders

## 2017-05-26 NOTE — Progress Notes (Signed)
Hematology and Oncology Follow Up Visit  Richard [name] [id number] Jan 13, 1946 71 y.o. 05/26/2017 8:52 AM    Principle Diagnosis: 71 year old gentleman with prostate cancer diagnosed in December of 2005. He had a Gleason score 4+3 = 7 with a PSA of 3.66  Prior Therapy: 1. He is status post retropubic prostatectomy and bilateral lymph node dissection done on 11/25/2004. The pathology showed a Gleason score of 4+4 equals 8 with staging of T2b N0. His PSA nadir was at 0.12. 2. he is status post salvage radiation therapy done between March and May of 2007 for a rise in his PSA to 0.29. He received total of 65 gray in 35 fractions. 3. he subsequently had another rise in his PSA up to 1.52. He was treated with Lupron intermittently last treatment was in October of 2010 with a PSA nadir down to 0.08. Most recent PSA was up to 8.91.   Current therapy:   Lupron was restarted in April 2016 he is currently receiving 30 mg every 4 months.  Interim History: Richard [name] presents today for a followup visit with his wife. Since his last visit, he continues to do well without any recent illnesses.  He tolerated Lupron very well without any major side effects. He does report very mild hot flashes which subsides after his injection. He denied any weight gain, excessive fatigue or lethargy.He denies any bone pain or urinary symptoms. His performance status and quality of life remains excellent. He was on a vacation for the last few weeks and was able to enjoy himself without any decline.  He is not reporting any headaches, blurry vision or syncope or seizures. He is not reporting any chest pain or shortness of breath is not reporting any back pain is not reporting any genitourinary complaints. Does not report any fevers or chills or sweats. Does not report any chest pain or shortness of breath. As that report any nausea or vomiting. Did not report any frequency urgency or hesitancy. Rest of his review of systems  unremarkable.  Medications: I have reviewed the patient's current medications.   Allergies:  Allergies  Allergen Reactions  . Effexor Xr [Venlafaxine Hcl] Rash    Past Medical History, Surgical history, Social history, and Family History were reviewed and updated.  Marland Kitchen Physical Exam: Blood pressure 124/61, pulse 69, temperature 98.2 F (36.8 C), temperature source Oral, resp. rate 19, height 6\' 2"  (1.88 m), weight 223 lb (101.2 kg), SpO2 100 %. ECOG: 0 General appearance: Alert, awake gentleman without distress. Head: Normocephalic, without obvious abnormality no oral thrush or ulcers. Neck: no adenopathy masses or lesions. Lymph nodes: Cervical, supraclavicular, and axillary nodes normal. Heart:regular rate and rhythm, S1, S2 normal, no murmur, click, rub or gallop Lung:chest clear, no wheezing, rales, normal symmetric air entry Abdomin: soft, non-tender, without masses or organomegaly. No rebound or guarding. EXT:no erythema, induration, or nodules Skin: No rashes or lesions noted.  Lab Results: Lab Results  Component Value Date   WBC 6.7 05/24/2017   HGB 11.1 (L) 05/24/2017   HCT 34.4 (L) 05/24/2017   MCV 97.5 05/24/2017   PLT 220 05/24/2017     Chemistry      Component Value Date/Time   NA 140 05/24/2017 0810   K 4.4 05/24/2017 0810   CL 105 03/01/2017 0823   CL 102 02/20/2013 1254   CO2 25 05/24/2017 0810   BUN 24.7 05/24/2017 0810   CREATININE 1.2 05/24/2017 0810      Component Value Date/Time  CALCIUM 9.3 05/24/2017 0810   ALKPHOS 60 05/24/2017 0810   AST 14 05/24/2017 0810   ALT 9 05/24/2017 0810   BILITOT 0.41 05/24/2017 0810        Results for KAIMEN, PEINE (MRN [id number]) as of 05/26/2017 07:54  Ref. Range 09/14/2016 11:01 01/18/2017 09:48 05/24/2017 08:09  Prostate Specific Ag, Serum Latest Ref Range: 0.0 - 4.0 ng/mL <0.1 <0.1 <0.1       Impression and Plan:  71 year old gentleman with:   1. Prostate cancer: Initially diagnosed in 2005  with Gleason score of 4+4 equals 8, PSA at the time diagnosed at 3.66. He has biochemically relapsing disease with PSA of  To 8.91. Staging workup including a bone scan and CT scan showed no evidence of disease.  He is currently hormone deprivation only in the form of Lupron that was started in April 2016.   His PSA on 05/24/2017 continues to be very low.  Risks and benefits of continuing Lupron today were reviewed and he is agreeable to continue. He will receive Lupron every 4 months.   2. Diabetes mellitus: Continue to follow with Dr. Dwyane Dee. His diabetes continues to be under reasonable control despite androgen deprivation.  3. Followup: In 4 months for his next Lupron injection.    Zola Button, MD 9/27/20188:52 AM

## 2017-06-04 ENCOUNTER — Other Ambulatory Visit: Payer: Self-pay | Admitting: Endocrinology

## 2017-06-06 ENCOUNTER — Other Ambulatory Visit: Payer: Self-pay | Admitting: Endocrinology

## 2017-06-06 DIAGNOSIS — E1165 Type 2 diabetes mellitus with hyperglycemia: Secondary | ICD-10-CM

## 2017-06-07 ENCOUNTER — Other Ambulatory Visit (INDEPENDENT_AMBULATORY_CARE_PROVIDER_SITE_OTHER): Payer: Medicare Other

## 2017-06-07 DIAGNOSIS — E1165 Type 2 diabetes mellitus with hyperglycemia: Secondary | ICD-10-CM

## 2017-06-07 LAB — MICROALBUMIN / CREATININE URINE RATIO
Creatinine,U: 117.6 mg/dL
MICROALB UR: 25.1 mg/dL — AB (ref 0.0–1.9)
Microalb Creat Ratio: 21.3 mg/g (ref 0.0–30.0)

## 2017-06-07 LAB — HEMOGLOBIN A1C: Hgb A1c MFr Bld: 6.4 % (ref 4.6–6.5)

## 2017-06-07 LAB — LIPID PANEL
CHOL/HDL RATIO: 3
CHOLESTEROL: 174 mg/dL (ref 0–200)
HDL: 62.3 mg/dL (ref >39.00)
LDL CALC: 82 mg/dL (ref 0–99)
NonHDL: 111.48
TRIGLYCERIDES: 148 mg/dL (ref 0.0–149.0)
VLDL: 29.6 mg/dL (ref 0.0–40.0)

## 2017-06-07 LAB — GLUCOSE, RANDOM: Glucose, Bld: 127 mg/dL — ABNORMAL HIGH (ref 70–99)

## 2017-06-10 ENCOUNTER — Ambulatory Visit: Payer: Medicare Other | Admitting: Endocrinology

## 2017-06-15 ENCOUNTER — Ambulatory Visit (INDEPENDENT_AMBULATORY_CARE_PROVIDER_SITE_OTHER): Payer: Medicare Other | Admitting: Endocrinology

## 2017-06-15 ENCOUNTER — Encounter: Payer: Self-pay | Admitting: Endocrinology

## 2017-06-15 VITALS — BP 126/74 | HR 69 | Ht 74.0 in | Wt 221.6 lb

## 2017-06-15 DIAGNOSIS — Z23 Encounter for immunization: Secondary | ICD-10-CM

## 2017-06-15 DIAGNOSIS — E119 Type 2 diabetes mellitus without complications: Secondary | ICD-10-CM

## 2017-06-15 NOTE — Progress Notes (Signed)
Patient ID: Richard [name], male   DOB: 1945/12/15, 71 y.o.   MRN: [id number]   Reason for Appointment:  Follow-up  History of Present Illness   Problem 1:  DIABETES MELITUS, date of diagnosis 1995 with a glucose of 320 and HemoglobinA1c of 9.6 Previous history: He has been on various oral hypoglycemic drugs in the past but for the last few years has required multiple drugs in combination; diabetes has been generally well controlled with A1c near normal  Recent history:   Oral hypoglycemic drugs: Amaryl 2 mg once a day in a.m., metformin 1000 twice a day, Actos 15 mg daily  Current management, blood sugar patterns and problems identified:  His A1c is still about the same at 6.4 previously has been around 6%  He has not brought his monitor for download today  His weight has gone up slightly  Although he thinks he is still generally watching his diet he is not consistent in watching his calories, carbohydrates and fats especially when traveling on medication and overall eating more sweets again  He is trying to do a little better with his meals in the morning and getting more protein with eggs  He is trying to walk fairly regularly  Side effects from medications: None       Monitors blood glucose:  irregularly     Glucometer: One Touch ultra 2 .          Blood Glucose readings:   Recent range at home 117-130   Hypoglycemia:  none                 Physical activity: exercise:  Walking and yardwork regularly  Dietician visit: Most recent:? 2007   Weight control:      Wt Readings from Last 3 Encounters:  06/15/17 221 lb 9.6 oz (100.5 kg)  05/26/17 223 lb (101.2 kg)  02/08/17 219 lb 9.6 oz (99.6 kg)    Diabetes labs:  Lab Results  Component Value Date   HGBA1C 6.4 06/07/2017   HGBA1C 6.5 02/04/2017   HGBA1C 6.5 10/05/2016   Lab Results  Component Value Date   MICROALBUR 25.1 (H) 06/07/2017   LDLCALC 82 06/07/2017   CREATININE 1.2 05/24/2017     Other problems: See review of systems   No visits with results within 1 Week(s) from this visit.  Latest known visit with results is:  Lab on 06/07/2017  Component Date Value Ref Range Status  . Hgb A1c MFr Bld 06/07/2017 6.4  4.6 - 6.5 % Final   Glycemic Control Guidelines for People with Diabetes:Non Diabetic:  <6%Goal of Therapy: <7%Additional Action Suggested:  >8%   . Microalb, Ur 06/07/2017 25.1* 0.0 - 1.9 mg/dL Final  . Creatinine,U 06/07/2017 117.6  mg/dL Final  . Microalb Creat Ratio 06/07/2017 21.3  0.0 - 30.0 mg/g Final  . Cholesterol 06/07/2017 174  0 - 200 mg/dL Final   ATP III Classification       Desirable:  < 200 mg/dL               Borderline High:  200 - 239 mg/dL          High:  > = 240 mg/dL  . Triglycerides 06/07/2017 148.0  0.0 - 149.0 mg/dL Final   Normal:  <150 mg/dLBorderline High:  150 - 199 mg/dL  . HDL 06/07/2017 62.30  >39.00 mg/dL Final  . VLDL 06/07/2017 29.6  0.0 - 40.0 mg/dL Final  . LDL Cholesterol 06/07/2017 82  0 - 99 mg/dL Final  . Total CHOL/HDL Ratio 06/07/2017 3   Final                  Men          Women1/2 Average Risk     3.4          3.3Average Risk          5.0          4.42X Average Risk          9.6          7.13X Average Risk          15.0          11.0                      . NonHDL 06/07/2017 111.48   Final   NOTE:  Non-HDL goal should be 30 mg/dL higher than patient's LDL goal (i.e. LDL goal of < 70 mg/dL, would have non-HDL goal of < 100 mg/dL)  . Glucose, Bld 06/07/2017 127* 70 - 99 mg/dL Final     Allergies as of 06/15/2017      Reactions   Effexor Xr [venlafaxine Hcl] Rash      Medication List       Accurate as of 06/15/17  9:29 AM. Always use your most recent med list.          amLODipine 5 MG tablet Commonly known as:  NORVASC TAKE 1 TABLET(5 MG) BY MOUTH DAILY   atorvastatin 10 MG tablet Commonly known as:  LIPITOR TAKE 1 TABLET(10 MG) BY MOUTH DAILY   glimepiride 2 MG tablet Commonly known as:   AMARYL TAKE 1 TABLET BY MOUTH EVERY DAY WITH BREAKFAST   glucose blood test strip Commonly known as:  ONE TOUCH ULTRA TEST Use as instructed to check blood sugar 3 times per week dx code E11.9   LUPRON DEPOT (67-MONTH) IM Inject into the muscle.   metFORMIN 1000 MG tablet Commonly known as:  GLUCOPHAGE TAKE 1 TABLET(1000 MG) BY MOUTH TWICE DAILY WITH A MEAL   ONE TOUCH ULTRA 2 w/Device Kit Use to check blood sugar 3 times per week dx code D98.3   ONETOUCH DELICA LANCETS FINE Misc Use to check blood sugar 3 times per week dx code E11.9   pioglitazone 30 MG tablet Commonly known as:  ACTOS TAKE 1 TABLET(30 MG) BY MOUTH DAILY   ramipril 5 MG capsule Commonly known as:  ALTACE TAKE 1 CAPSULE (10 MG TOTAL) BY MOUTH DAILY.   sertraline 50 MG tablet Commonly known as:  ZOLOFT Take 50 mg by mouth daily.   triamterene-hydrochlorothiazide 37.5-25 MG capsule Commonly known as:  DYAZIDE Take 1 each (1 capsule total) by mouth daily.       Allergies:  Allergies  Allergen Reactions  . Effexor Xr [Venlafaxine Hcl] Rash    Past Medical History:  Diagnosis Date  . Diabetes mellitus without complication ([place])   . Hypertension   . prostate ca dx'd 2006   prostatectomy and xrt    No past surgical history on file.  Family History  Problem Relation Age of Onset  . Hypertension Father   . Heart disease Neg Hx   . Diabetes Neg Hx     Social History:  reports that he has been smoking.  He has never used smokeless tobacco. His alcohol and drug histories are not on file.    REVIEW OF  SYSTEMS:       Hypertension:  blood pressure has been  well controlled with 5 mg ramipril and Dyazide, also on amlodipine 5 mg. On his last visit he was told not to take ramipril twice a day which was dropping his blood pressure too much and his creatinine was relatively higher    Does  monitor at home occasionally: Systolic blood pressure has been 120s  BP Readings from Last 3 Encounters:   06/15/17 126/74  05/26/17 124/61  02/08/17 106/64    Lipids:  he has been taking Lipitor 10 mg LDL is controlled as below   Lab Results  Component Value Date   CHOL 174 06/07/2017   HDL 62.30 06/07/2017   LDLCALC 82 06/07/2017   TRIG 148.0 06/07/2017   CHOLHDL 3 06/07/2017     History of vitamin B12 deficiency,he is taking supplement CBC followed by oncologist also  Lab Results  Component Value Date   WBC 6.7 05/24/2017   HGB 11.1 (L) 05/24/2017   HCT 34.4 (L) 05/24/2017   MCV 97.5 05/24/2017   PLT 220 05/24/2017     He is taking sertraline from his PCP for depression   LABS:  No visits with results within 1 Week(s) from this visit.  Latest known visit with results is:  Lab on 06/07/2017  Component Date Value Ref Range Status  . Hgb A1c MFr Bld 06/07/2017 6.4  4.6 - 6.5 % Final   Glycemic Control Guidelines for People with Diabetes:Non Diabetic:  <6%Goal of Therapy: <7%Additional Action Suggested:  >8%   . Microalb, Ur 06/07/2017 25.1* 0.0 - 1.9 mg/dL Final  . Creatinine,U 06/07/2017 117.6  mg/dL Final  . Microalb Creat Ratio 06/07/2017 21.3  0.0 - 30.0 mg/g Final  . Cholesterol 06/07/2017 174  0 - 200 mg/dL Final   ATP III Classification       Desirable:  < 200 mg/dL               Borderline High:  200 - 239 mg/dL          High:  > = 240 mg/dL  . Triglycerides 06/07/2017 148.0  0.0 - 149.0 mg/dL Final   Normal:  <150 mg/dLBorderline High:  150 - 199 mg/dL  . HDL 06/07/2017 62.30  >39.00 mg/dL Final  . VLDL 06/07/2017 29.6  0.0 - 40.0 mg/dL Final  . LDL Cholesterol 06/07/2017 82  0 - 99 mg/dL Final  . Total CHOL/HDL Ratio 06/07/2017 3   Final                  Men          Women1/2 Average Risk     3.4          3.3Average Risk          5.0          4.42X Average Risk          9.6          7.13X Average Risk          15.0          11.0                      . NonHDL 06/07/2017 111.48   Final   NOTE:  Non-HDL goal should be 30 mg/dL higher than patient's LDL goal  (i.e. LDL goal of < 70 mg/dL, would have non-HDL goal of < 100 mg/dL)  . Glucose, Bld  06/07/2017 127* 70 - 99 mg/dL Final     Examination:   BP 126/74   Pulse 69   Ht _0  (1.88 m)   Wt 221 lb 9.6 oz (100.5 kg)   SpO2 98%   BMI 28.45 kg/m   Body mass index is 28.45 kg/m.      ASSESSMENT/ PLAN:    Diabetes type 2 with mild obesity:   See history of present illness for detailed discussion of current diabetes management, blood sugar patterns and problems identified   His A1c is still fairly good at 6.4 Reports high normal readings fasting but not higher after meals Probably not checking blood sugars enough especially after meals His diet can be better and more consistent also  Will not change his regimen including Actos which is likely to be helping stabilize long-term He would try to improve his diet  Hypertension: Blood pressure is well controlled now  LIPIDS: Well controlled  Influenza vaccine given  [facility] 06/15/2017, 9:29 AM     Note: This office note was prepared with Dragon voice recognition system technology. Any transcriptional errors that result from this process are unintentional.

## 2017-09-26 ENCOUNTER — Inpatient Hospital Stay: Payer: Medicare Other | Attending: Oncology

## 2017-09-26 DIAGNOSIS — E119 Type 2 diabetes mellitus without complications: Secondary | ICD-10-CM | POA: Insufficient documentation

## 2017-09-26 DIAGNOSIS — C61 Malignant neoplasm of prostate: Secondary | ICD-10-CM | POA: Insufficient documentation

## 2017-09-26 DIAGNOSIS — Z5111 Encounter for antineoplastic chemotherapy: Secondary | ICD-10-CM | POA: Insufficient documentation

## 2017-09-26 LAB — CBC WITH DIFFERENTIAL/PLATELET
BASOS ABS: 0 10*3/uL (ref 0.0–0.1)
BASOS PCT: 1 %
Eosinophils Absolute: 0.1 10*3/uL (ref 0.0–0.5)
Eosinophils Relative: 2 %
HCT: 35.2 % — ABNORMAL LOW (ref 38.4–49.9)
HEMOGLOBIN: 11.9 g/dL — AB (ref 13.0–17.1)
Lymphocytes Relative: 33 %
Lymphs Abs: 2.1 10*3/uL (ref 0.9–3.3)
MCH: 31.8 pg (ref 27.2–33.4)
MCHC: 33.7 g/dL (ref 32.0–36.0)
MCV: 94.4 fL (ref 79.3–98.0)
MONOS PCT: 13 %
Monocytes Absolute: 0.8 10*3/uL (ref 0.1–0.9)
NEUTROS ABS: 3.4 10*3/uL (ref 1.5–6.5)
NEUTROS PCT: 51 %
Platelets: 240 10*3/uL (ref 140–400)
RBC: 3.73 MIL/uL — ABNORMAL LOW (ref 4.20–5.82)
RDW: 13.1 % (ref 11.0–15.6)
WBC: 6.5 10*3/uL (ref 4.0–10.3)

## 2017-09-26 LAB — COMPREHENSIVE METABOLIC PANEL
ALK PHOS: 59 U/L (ref 40–150)
ALT: 11 U/L (ref 0–55)
ANION GAP: 7 (ref 3–11)
AST: 14 U/L (ref 5–34)
Albumin: 3.9 g/dL (ref 3.5–5.0)
BUN: 33 mg/dL — ABNORMAL HIGH (ref 7–26)
CALCIUM: 9.5 mg/dL (ref 8.4–10.4)
CO2: 29 mmol/L (ref 22–29)
Chloride: 104 mmol/L (ref 98–109)
Creatinine, Ser: 1.48 mg/dL — ABNORMAL HIGH (ref 0.70–1.30)
GFR calc Af Amer: 53 mL/min — ABNORMAL LOW (ref >60)
GFR calc non Af Amer: 46 mL/min — ABNORMAL LOW (ref >60)
Glucose, Bld: 121 mg/dL (ref 70–140)
POTASSIUM: 4.7 mmol/L (ref 3.5–5.1)
SODIUM: 140 mmol/L (ref 136–145)
Total Bilirubin: 0.4 mg/dL (ref 0.2–1.2)
Total Protein: 6.5 g/dL (ref 6.4–8.3)

## 2017-09-27 ENCOUNTER — Inpatient Hospital Stay (HOSPITAL_BASED_OUTPATIENT_CLINIC_OR_DEPARTMENT_OTHER): Payer: Medicare Other | Admitting: Oncology

## 2017-09-27 ENCOUNTER — Inpatient Hospital Stay: Payer: Medicare Other

## 2017-09-27 ENCOUNTER — Telehealth: Payer: Self-pay | Admitting: Oncology

## 2017-09-27 VITALS — BP 123/64 | HR 82 | Temp 97.6°F | Resp 18 | Ht 74.0 in | Wt 219.8 lb

## 2017-09-27 DIAGNOSIS — C61 Malignant neoplasm of prostate: Secondary | ICD-10-CM

## 2017-09-27 DIAGNOSIS — Z5111 Encounter for antineoplastic chemotherapy: Secondary | ICD-10-CM | POA: Diagnosis not present

## 2017-09-27 DIAGNOSIS — E119 Type 2 diabetes mellitus without complications: Secondary | ICD-10-CM | POA: Diagnosis not present

## 2017-09-27 DIAGNOSIS — Z8546 Personal history of malignant neoplasm of prostate: Secondary | ICD-10-CM

## 2017-09-27 LAB — PROSTATE-SPECIFIC AG, SERUM (LABCORP)

## 2017-09-27 MED ORDER — LEUPROLIDE ACETATE (4 MONTH) 30 MG IM KIT
30.0000 mg | PACK | Freq: Once | INTRAMUSCULAR | Status: AC
  Administered 2017-09-27: 30 mg via INTRAMUSCULAR
  Filled 2017-09-27: qty 30

## 2017-09-27 NOTE — Patient Instructions (Signed)
Leuprolide depot injection or implant What is this medicine? LEUPROLIDE (loo PROE lide) is a man-made protein that acts like a natural hormone in the body. It decreases testosterone in men and decreases estrogen in women. In men, this medicine is used to treat advanced prostate cancer. In women, some forms of this medicine may be used to treat endometriosis, uterine fibroids, or other male hormone-related problems. This medicine may be used for other purposes; ask your health care provider or pharmacist if you have questions. COMMON BRAND NAME(S): Eligard, Lupron Depot, Lupron Depot-Ped, Viadur What should I tell my health care provider before I take this medicine? They need to know if you have any of these conditions: -diabetes -heart disease or previous heart attack -high blood pressure -high cholesterol -osteoporosis -pain or difficulty passing urine -spinal cord metastasis -stroke -tobacco smoker -unusual vaginal bleeding (women) -an unusual or allergic reaction to leuprolide, benzyl alcohol, other medicines, foods, dyes, or preservatives -pregnant or trying to get pregnant -breast-feeding How should I use this medicine? This medicine is for injection into a muscle or for implant or injection under the skin. It is given by a health care professional in a hospital or clinic setting. The specific product will determine how it will be given to you. Make sure you understand which product you receive and how often you will receive it. Talk to your pediatrician regarding the use of this medicine in children. Special care may be needed. Overdosage: If you think you have taken too much of this medicine contact a poison control center or emergency room at once. NOTE: This medicine is only for you. Do not share this medicine with others. What if I miss a dose? It is important not to miss a dose. Call your doctor or health care professional if you are unable to keep an appointment. Depot  injections: Depot injections are given either once-monthly, every 12 weeks, every 16 weeks, or every 24 weeks depending on the product you are prescribed. The product you are prescribed will be based on if you are male or male, and your condition. Make sure you understand your product and dosing. Implant dosing: The implant is removed and replaced once a year. The implant is only used in males. What may interact with this medicine? Do not take this medicine with any of the following medications: -chasteberry This medicine may also interact with the following medications: -herbal or dietary supplements, like black cohosh or DHEA -male hormones, like estrogens or progestins and birth control pills, patches, rings, or injections -male hormones, like testosterone This list may not describe all possible interactions. Give your health care provider a list of all the medicines, herbs, non-prescription drugs, or dietary supplements you use. Also tell them if you smoke, drink alcohol, or use illegal drugs. Some items may interact with your medicine. What should I watch for while using this medicine? Visit your doctor or health care professional for regular checks on your progress. During the first weeks of treatment, your symptoms may get worse, but then will improve as you continue your treatment. You may get hot flashes, increased bone pain, increased difficulty passing urine, or an aggravation of nerve symptoms. Discuss these effects with your doctor or health care professional, some of them may improve with continued use of this medicine. Male patients may experience a menstrual cycle or spotting during the first months of therapy with this medicine. If this continues, contact your doctor or health care professional. What side effects may I notice from   receiving this medicine? Side effects that you should report to your doctor or health care professional as soon as possible: -allergic reactions like  skin rash, itching or hives, swelling of the face, lips, or tongue -breathing problems -chest pain -depression or memory disorders -pain in your legs or groin -pain at site where injected or implanted -severe headache -swelling of the feet and legs -visual changes -vomiting Side effects that usually do not require medical attention (report to your doctor or health care professional if they continue or are bothersome): -breast swelling or tenderness -decrease in sex drive or performance -diarrhea -hot flashes -loss of appetite -muscle, joint, or bone pains -nausea -redness or irritation at site where injected or implanted -skin problems or acne This list may not describe all possible side effects. Call your doctor for medical advice about side effects. You may report side effects to FDA at 1-800-FDA-1088. Where should I keep my medicine? This drug is given in a hospital or clinic and will not be stored at home. NOTE: This sheet is a summary. It may not cover all possible information. If you have questions about this medicine, talk to your doctor, pharmacist, or health care provider.  2015, [employer] Standard. (2010-02-17 14:41:21)  

## 2017-09-27 NOTE — Telephone Encounter (Signed)
Gave avs and calendar for may  °

## 2017-09-27 NOTE — Progress Notes (Signed)
Hematology and Oncology Follow Up Visit  Richard [name] [id number] Apr 02, 1946 72 y.o. 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 He had a Gleason score 4+3 = 7 with a PSA of 3.66.  He has biochemical relapse currently with elevated PSA.  Prior Therapy: 1. He is status post retropubic prostatectomy and bilateral lymph node dissection done on 11/25/2004. The pathology showed a Gleason score of 4+4 equals 8 with staging of T2b N0.  2. Sstatus post salvage radiation therapy done between March and May of 2007 for a rise in his PSA to 0.29. He received total of 65 gray in 35 fractions. 3.  He was treated with intermittent Lupron until 2016.  Lupron has been given at that time.  Current therapy:   Lupron 30 mg every 4 months started in April 2016.  Interim History: Richard [name] is here with his wife for a follow-up.  He continues to do well without any major complications.  He tolerates Lupron without any new side effects.  He reported hot flashes which has not changed since the last visit.  They are manageable at this time especially in the winter.  He denies any excessive fatigue, tiredness or weight loss.  He denies any weight gain or major changes in his performance status.  He does not report any bone pain, pathological fractures or change in his urination habits.  He denies any nocturia or hematuria.  Continues to work and attends to activities of daily living without any changes.  He is not reporting any headaches, blurry vision or syncope or seizures.  He does not report any fevers or chills or sweats.  He is not reporting any chest pain or shortness of breath.  He does not report any cough, wheezing or hemoptysis.  He does not report any nausea, vomiting or early satiety.  He does not report any skeletal complaints of arthralgias or myalgias.  He does not report any skin rashes or lesions.  Rest of his review  of systems is negative.   Medications: I have reviewed the patient's current medications.   Allergies:  Allergies  Allergen Reactions  . Effexor Xr [Venlafaxine Hcl] Rash    Past Medical History, Surgical history, Social history, and Family History updated and remain unchanged.  Marland Kitchen Physical Exam: Blood pressure 123/64, pulse 82, temperature 97.6 F (36.4 C), temperature source Oral, resp. rate 18, height 6\' 2"  (1.88 m), weight 219 lb 12.8 oz (99.7 kg), SpO2 100 %. ECOG: 0 General appearance: Well-appearing gentleman appeared comfortable. Head: Normocephalic, without obvious abnormality  Oropharynx: Without any thrush or ulcers.  Mucosa is pink. Eyes: No scleral icterus. Lymph nodes: Cervical, supraclavicular, and axillary nodes normal. Heart:regular rate and rhythm, S1, S2 normal, no murmur, click, rub or gallop Lung: Clear to auscultation without wheezes or dullness to percussion. Abdomin: soft, non-tender, without masses or organomegaly.  Musculoskeletal: No joint effusion or deformity. Skin: No rashes or lesions.  No ecchymosis or petechiae.  Lab Results: Lab Results  Component Value Date   WBC 6.5 09/26/2017   HGB 11.9 (L) 09/26/2017   HCT 35.2 (L) 09/26/2017   MCV 94.4 09/26/2017   PLT 240 09/26/2017     Chemistry      Component Value Date/Time   NA 140 09/26/2017 0743   NA 140 05/24/2017 0810   K 4.7 09/26/2017 0743   K 4.4 05/24/2017 0810   CL 104 09/26/2017 0743   CL  102 02/20/2013 1254   CO2 29 09/26/2017 0743   CO2 25 05/24/2017 0810   BUN 33 (H) 09/26/2017 0743   BUN 24.7 05/24/2017 0810   CREATININE 1.48 (H) 09/26/2017 0743   CREATININE 1.2 05/24/2017 0810      Component Value Date/Time   CALCIUM 9.5 09/26/2017 0743   CALCIUM 9.3 05/24/2017 0810   ALKPHOS 59 09/26/2017 0743   ALKPHOS 60 05/24/2017 0810   AST 14 09/26/2017 0743   AST 14 05/24/2017 0810   ALT 11 09/26/2017 0743   ALT 9 05/24/2017 0810   BILITOT 0.4 09/26/2017 0743   BILITOT 0.41  05/24/2017 0810          Impression and Plan:  72 year old gentleman with:   1.  Biochemically relapsing prostate cancer: Initially diagnosed in 2005 with Gleason score of 4+4 equals 8, PSA at the time diagnosed at 3.66.  He is status post prostatectomy at the time of diagnosis.  He is on Lupron 30 mg every 4 months since April 2016.  His PSA continues to be undetectable on the current treatment without any recent complications.  The natural course of his disease was reviewed again and risks and benefits of continuing Lupron was discussed.  He understands that long-term androgen deprivation would be able to control this disease long-term with few long-term complications such as osteoporosis among others.  Intermittent androgen deprivation therapy was discussed which could potentially present inferior outcome.  After discussion today, he is agreeable to continue with Lupron continuously every 4 months.   2. Diabetes mellitus: His blood sugar has been under reasonable control without any exacerbation related to Lupron.  3. Followup: In 4 months for his next Lupron injection.  15  minutes was spent with the patient face-to-face today.  More than 50% of time was dedicated to patient counseling, education and answering his questions.    Zola Button, MD 1/29/20199:23 AM

## 2017-10-19 DIAGNOSIS — H903 Sensorineural hearing loss, bilateral: Secondary | ICD-10-CM | POA: Diagnosis not present

## 2017-10-19 DIAGNOSIS — H8102 Meniere's disease, left ear: Secondary | ICD-10-CM | POA: Diagnosis not present

## 2017-10-19 DIAGNOSIS — H9312 Tinnitus, left ear: Secondary | ICD-10-CM | POA: Diagnosis not present

## 2017-12-09 ENCOUNTER — Other Ambulatory Visit: Payer: Medicare Other

## 2017-12-14 ENCOUNTER — Ambulatory Visit: Payer: Medicare Other | Admitting: Endocrinology

## 2017-12-31 ENCOUNTER — Other Ambulatory Visit: Payer: Self-pay | Admitting: Endocrinology

## 2018-01-24 ENCOUNTER — Inpatient Hospital Stay: Payer: Medicare Other | Attending: Oncology

## 2018-01-24 DIAGNOSIS — E119 Type 2 diabetes mellitus without complications: Secondary | ICD-10-CM | POA: Insufficient documentation

## 2018-01-24 DIAGNOSIS — Z5111 Encounter for antineoplastic chemotherapy: Secondary | ICD-10-CM | POA: Insufficient documentation

## 2018-01-24 DIAGNOSIS — D649 Anemia, unspecified: Secondary | ICD-10-CM | POA: Diagnosis not present

## 2018-01-24 DIAGNOSIS — C61 Malignant neoplasm of prostate: Secondary | ICD-10-CM | POA: Insufficient documentation

## 2018-01-24 LAB — CBC WITH DIFFERENTIAL (CANCER CENTER ONLY)
Basophils Absolute: 0.1 10*3/uL (ref 0.0–0.1)
Basophils Relative: 1 %
EOS ABS: 0.1 10*3/uL (ref 0.0–0.5)
EOS PCT: 1 %
HCT: 34.1 % — ABNORMAL LOW (ref 38.4–49.9)
Hemoglobin: 11.5 g/dL — ABNORMAL LOW (ref 13.0–17.1)
LYMPHS ABS: 1.6 10*3/uL (ref 0.9–3.3)
Lymphocytes Relative: 23 %
MCH: 32.3 pg (ref 27.2–33.4)
MCHC: 33.7 g/dL (ref 32.0–36.0)
MCV: 95.7 fL (ref 79.3–98.0)
Monocytes Absolute: 0.7 10*3/uL (ref 0.1–0.9)
Monocytes Relative: 10 %
Neutro Abs: 4.7 10*3/uL (ref 1.5–6.5)
Neutrophils Relative %: 65 %
PLATELETS: 242 10*3/uL (ref 140–400)
RBC: 3.57 MIL/uL — AB (ref 4.20–5.82)
RDW: 13 % (ref 11.0–14.6)
WBC: 7.1 10*3/uL (ref 4.0–10.3)

## 2018-01-24 LAB — CMP (CANCER CENTER ONLY)
ALK PHOS: 59 U/L (ref 40–150)
ALT: 18 U/L (ref 0–55)
AST: 19 U/L (ref 5–34)
Albumin: 4 g/dL (ref 3.5–5.0)
Anion gap: 10 (ref 3–11)
BUN: 26 mg/dL (ref 7–26)
CALCIUM: 9.4 mg/dL (ref 8.4–10.4)
CHLORIDE: 104 mmol/L (ref 98–109)
CO2: 25 mmol/L (ref 22–29)
CREATININE: 1.36 mg/dL — AB (ref 0.70–1.30)
GFR, EST NON AFRICAN AMERICAN: 50 mL/min — AB (ref >60)
GFR, Est AFR Am: 58 mL/min — ABNORMAL LOW (ref >60)
Glucose, Bld: 100 mg/dL (ref 70–140)
Potassium: 4.9 mmol/L (ref 3.5–5.1)
Sodium: 139 mmol/L (ref 136–145)
Total Bilirubin: 0.3 mg/dL (ref 0.2–1.2)
Total Protein: 6.6 g/dL (ref 6.4–8.3)

## 2018-01-25 ENCOUNTER — Telehealth: Payer: Self-pay | Admitting: Oncology

## 2018-01-25 ENCOUNTER — Inpatient Hospital Stay (HOSPITAL_BASED_OUTPATIENT_CLINIC_OR_DEPARTMENT_OTHER): Payer: Medicare Other | Admitting: Oncology

## 2018-01-25 ENCOUNTER — Inpatient Hospital Stay: Payer: Medicare Other

## 2018-01-25 VITALS — BP 119/66 | HR 68 | Temp 98.6°F | Resp 18 | Ht 74.0 in | Wt 220.5 lb

## 2018-01-25 DIAGNOSIS — D649 Anemia, unspecified: Secondary | ICD-10-CM | POA: Diagnosis not present

## 2018-01-25 DIAGNOSIS — Z8546 Personal history of malignant neoplasm of prostate: Secondary | ICD-10-CM

## 2018-01-25 DIAGNOSIS — E119 Type 2 diabetes mellitus without complications: Secondary | ICD-10-CM

## 2018-01-25 DIAGNOSIS — C61 Malignant neoplasm of prostate: Secondary | ICD-10-CM | POA: Diagnosis not present

## 2018-01-25 DIAGNOSIS — Z5111 Encounter for antineoplastic chemotherapy: Secondary | ICD-10-CM | POA: Diagnosis not present

## 2018-01-25 LAB — PROSTATE-SPECIFIC AG, SERUM (LABCORP): Prostate Specific Ag, Serum: <0.1 ng/mL (ref 0.0–4.0)

## 2018-01-25 MED ORDER — LEUPROLIDE ACETATE (4 MONTH) 30 MG IM KIT
30.0000 mg | PACK | Freq: Once | INTRAMUSCULAR | Status: AC
  Administered 2018-01-25: 30 mg via INTRAMUSCULAR
  Filled 2018-01-25: qty 30

## 2018-01-25 NOTE — Telephone Encounter (Signed)
Appointments scheduled AVS/Calendar printed per 5/29 los °

## 2018-01-25 NOTE — Progress Notes (Signed)
Hematology and Oncology Follow Up Visit  Richard [name] [id number] [birth year] 72 y.o. 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 He had a Gleason score 4+3 = 7 with a PSA of 3.66.  He developed biochemical relapse in 2016.  Prior Therapy: 1. He is status post retropubic prostatectomy and bilateral lymph node dissection done on 11/25/2004. The pathology showed a Gleason score of 4+4 equals 8 with staging of T2b N0.  2. Sstatus post salvage radiation therapy done between March and May of 2007 for a rise in his PSA to 0.29. He received total of 65 gray in 35 fractions. 3.  He was treated with intermittent Lupron until 2016.  Lupron has been given at that time.  Current therapy:   Lupron 30 mg every 4 months started in April 2016.  Interim History: Richard [name] is here for a follow-up.  Since last visit, he reports no major changes in his health.  He does report some mild fatigue and has been started on low dose iron every other day.  He denies any constipation or change in his bowel habits.  He denies any hematochezia or melena.  He denies any bone pain or pathological fractures.  He denies any complications related to Lupron.  He denies any hot flashes or changes in his performance status.  He is not reporting any headaches, blurry vision or syncope or seizures.  He does not report any fevers or chills or sweats.  His weight remains stable.  He is not reporting any chest pain or shortness of breath.  He does not report any cough, wheezing or hemoptysis.  He does not report any nausea, vomiting or early satiety.  He denies any frequency urgency or hesitancy.  He does not report any skeletal complaints of arthralgias or myalgias.  He does not report any skin rashes or lesions.  He denies any lymphadenopathy or petechiae.  Remaining review of systems negative.  Medications: I have reviewed the patient's current  medications.   Allergies:  Allergies  Allergen Reactions  . Effexor Xr [Venlafaxine Hcl] Rash    Past Medical History, Surgical history, Social history, and Family History updated and remain unchanged.  Marland Kitchen Physical Exam: Blood pressure 119/66, pulse 68, temperature 98.6 F (37 C), temperature source Oral, resp. rate 18, height 6\' 2"  (1.88 m), weight 220 lb 8 oz (100 kg), SpO2 100 %. ECOG: 0 General appearance: Alert, awake gentleman without distress. Head: Atraumatic without abnormalities. Oropharynx: Mucous membranes moist and pink. Eyes: Pupils are equal without scleral icterus. Lymph nodes: No lymphadenopathy noted in the cervical, supraclavicular, and axillary nodes  Heart: Regular rate without any murmurs or gallops. Lung: Clear in all lung fields without any wheezes or dullness to percussion. Abdomin: Soft, nontender without any rebound or guarding.  Musculoskeletal: No clubbing or cyanosis. Skin: Skin is moist without any erythema or ecchymosis..  Lab Results: Lab Results  Component Value Date   WBC 7.1 01/24/2018   HGB 11.5 (L) 01/24/2018   HCT 34.1 (L) 01/24/2018   MCV 95.7 01/24/2018   PLT 242 01/24/2018     Chemistry      Component Value Date/Time   NA 139 01/24/2018 1104   NA 140 05/24/2017 0810   K 4.9 01/24/2018 1104   K 4.4 05/24/2017 0810   CL 104 01/24/2018 1104   CL 102 02/20/2013 1254   CO2 25 01/24/2018 1104   CO2 25 05/24/2017 0810  BUN 26 01/24/2018 1104   BUN 24.7 05/24/2017 0810   CREATININE 1.36 (H) 01/24/2018 1104   CREATININE 1.2 05/24/2017 0810      Component Value Date/Time   CALCIUM 9.4 01/24/2018 1104   CALCIUM 9.3 05/24/2017 0810   ALKPHOS 59 01/24/2018 1104   ALKPHOS 60 05/24/2017 0810   AST 19 01/24/2018 1104   AST 14 05/24/2017 0810   ALT 18 01/24/2018 1104   ALT 9 05/24/2017 0810   BILITOT 0.3 01/24/2018 1104   BILITOT 0.41 05/24/2017 0810        Results for OVAL, CAVAZOS (MRN [id number]) as of 01/25/2018 08:51   Ref. Range 09/26/2017 07:43 01/24/2018 11:04  Prostate Specific Ag, Serum Latest Ref Range: 0.0 - 4.0 ng/mL <0.1 <0.1    Impression and Plan:  72 year old man with:   1.  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    He remains on Lupron with excellent response to therapy and PSA is undetectable.  Risks and benefits of continuing Lupron long-term was reviewed today.  Osteoporosis as well as hyperlipidemia are complications related to long-term androgen deprivation therapy.  After discussion today is agreeable to continue.  He will receive Lupron every 4 months.   2. Diabetes mellitus: No recent exacerbation noted because of Lupron.  He continued to be monitored by his primary care physician.  3.  Anemia: Appears to be mild and could be related to chronic disease and mild iron deficiency.  He denies any GI symptoms and Hemoccult testing has been negative.  I urged him to have a colonoscopy for colon cancer screening purposes.  I will check iron studies with the next visit.  4. Followup: In 4 months for a follow-up.  15  minutes was spent with the patient face-to-face today.  More than 50% of time was dedicated to patient counseling, education and coordinating his care.    Zola Button, MD 5/29/20198:46 AM

## 2018-04-11 DIAGNOSIS — L308 Other specified dermatitis: Secondary | ICD-10-CM | POA: Diagnosis not present

## 2018-04-11 DIAGNOSIS — L570 Actinic keratosis: Secondary | ICD-10-CM | POA: Diagnosis not present

## 2018-04-11 DIAGNOSIS — D0462 Carcinoma in situ of skin of left upper limb, including shoulder: Secondary | ICD-10-CM | POA: Diagnosis not present

## 2018-04-11 DIAGNOSIS — L281 Prurigo nodularis: Secondary | ICD-10-CM | POA: Diagnosis not present

## 2018-04-11 DIAGNOSIS — D485 Neoplasm of uncertain behavior of skin: Secondary | ICD-10-CM | POA: Diagnosis not present

## 2018-05-04 DIAGNOSIS — H8102 Meniere's disease, left ear: Secondary | ICD-10-CM | POA: Diagnosis not present

## 2018-05-04 DIAGNOSIS — H903 Sensorineural hearing loss, bilateral: Secondary | ICD-10-CM | POA: Diagnosis not present

## 2018-05-04 DIAGNOSIS — H9312 Tinnitus, left ear: Secondary | ICD-10-CM | POA: Diagnosis not present

## 2018-05-10 ENCOUNTER — Other Ambulatory Visit: Payer: Self-pay | Admitting: Endocrinology

## 2018-05-11 NOTE — Telephone Encounter (Signed)
Last ov 06/15/17 ok to refill?

## 2018-05-22 ENCOUNTER — Other Ambulatory Visit: Payer: Medicare Other

## 2018-05-25 ENCOUNTER — Ambulatory Visit: Payer: Medicare Other | Admitting: Endocrinology

## 2018-05-30 ENCOUNTER — Inpatient Hospital Stay: Payer: Medicare Other | Attending: Oncology

## 2018-05-30 DIAGNOSIS — E1122 Type 2 diabetes mellitus with diabetic chronic kidney disease: Secondary | ICD-10-CM | POA: Insufficient documentation

## 2018-05-30 DIAGNOSIS — C61 Malignant neoplasm of prostate: Secondary | ICD-10-CM | POA: Diagnosis not present

## 2018-05-30 DIAGNOSIS — D631 Anemia in chronic kidney disease: Secondary | ICD-10-CM | POA: Diagnosis not present

## 2018-05-30 DIAGNOSIS — Z5111 Encounter for antineoplastic chemotherapy: Secondary | ICD-10-CM | POA: Insufficient documentation

## 2018-05-30 DIAGNOSIS — N189 Chronic kidney disease, unspecified: Secondary | ICD-10-CM | POA: Diagnosis not present

## 2018-05-30 LAB — CBC WITH DIFFERENTIAL (CANCER CENTER ONLY)
BASOS PCT: 1 %
Basophils Absolute: 0.1 10*3/uL (ref 0.0–0.1)
EOS ABS: 0.1 10*3/uL (ref 0.0–0.5)
Eosinophils Relative: 2 %
HCT: 34.7 % — ABNORMAL LOW (ref 38.4–49.9)
Hemoglobin: 11.7 g/dL — ABNORMAL LOW (ref 13.0–17.1)
Lymphocytes Relative: 29 %
Lymphs Abs: 1.7 10*3/uL (ref 0.9–3.3)
MCH: 32.6 pg (ref 27.2–33.4)
MCHC: 33.6 g/dL (ref 32.0–36.0)
MCV: 97 fL (ref 79.3–98.0)
MONO ABS: 0.6 10*3/uL (ref 0.1–0.9)
MONOS PCT: 11 %
Neutro Abs: 3.4 10*3/uL (ref 1.5–6.5)
Neutrophils Relative %: 57 %
Platelet Count: 249 10*3/uL (ref 140–400)
RBC: 3.58 MIL/uL — AB (ref 4.20–5.82)
RDW: 12.8 % (ref 11.0–14.6)
WBC Count: 5.9 10*3/uL (ref 4.0–10.3)

## 2018-05-30 LAB — CMP (CANCER CENTER ONLY)
ALT: 13 U/L (ref 0–44)
AST: 15 U/L (ref 15–41)
Albumin: 3.8 g/dL (ref 3.5–5.0)
Alkaline Phosphatase: 57 U/L (ref 38–126)
Anion gap: 8 (ref 5–15)
BUN: 24 mg/dL — AB (ref 8–23)
CO2: 27 mmol/L (ref 22–32)
Calcium: 9.5 mg/dL (ref 8.9–10.3)
Chloride: 104 mmol/L (ref 98–111)
Creatinine: 1.31 mg/dL — ABNORMAL HIGH (ref 0.61–1.24)
GFR, Est AFR Am: >60 mL/min (ref >60)
GFR, Estimated: 52 mL/min — ABNORMAL LOW (ref >60)
Glucose, Bld: 107 mg/dL — ABNORMAL HIGH (ref 70–99)
Potassium: 4.6 mmol/L (ref 3.5–5.1)
Sodium: 139 mmol/L (ref 135–145)
Total Bilirubin: 0.4 mg/dL (ref 0.3–1.2)
Total Protein: 6.5 g/dL (ref 6.5–8.1)

## 2018-05-30 LAB — IRON AND TIBC
IRON: 68 ug/dL (ref 42–163)
Saturation Ratios: 20 % — ABNORMAL LOW (ref 42–163)
TIBC: 339 ug/dL (ref 202–409)
UIBC: 271 ug/dL

## 2018-05-30 LAB — FERRITIN: Ferritin: 64 ng/mL (ref 24–336)

## 2018-05-31 LAB — PROSTATE-SPECIFIC AG, SERUM (LABCORP)

## 2018-06-01 ENCOUNTER — Inpatient Hospital Stay: Payer: Medicare Other

## 2018-06-01 ENCOUNTER — Inpatient Hospital Stay (HOSPITAL_BASED_OUTPATIENT_CLINIC_OR_DEPARTMENT_OTHER): Payer: Medicare Other | Admitting: Oncology

## 2018-06-01 VITALS — BP 111/69 | HR 67 | Temp 98.0°F | Resp 18 | Ht 74.0 in | Wt 213.2 lb

## 2018-06-01 DIAGNOSIS — C61 Malignant neoplasm of prostate: Secondary | ICD-10-CM

## 2018-06-01 DIAGNOSIS — N189 Chronic kidney disease, unspecified: Secondary | ICD-10-CM | POA: Diagnosis not present

## 2018-06-01 DIAGNOSIS — D631 Anemia in chronic kidney disease: Secondary | ICD-10-CM | POA: Diagnosis not present

## 2018-06-01 DIAGNOSIS — Z5111 Encounter for antineoplastic chemotherapy: Secondary | ICD-10-CM | POA: Diagnosis not present

## 2018-06-01 DIAGNOSIS — E1122 Type 2 diabetes mellitus with diabetic chronic kidney disease: Secondary | ICD-10-CM | POA: Diagnosis not present

## 2018-06-01 DIAGNOSIS — Z8546 Personal history of malignant neoplasm of prostate: Secondary | ICD-10-CM

## 2018-06-01 MED ORDER — LEUPROLIDE ACETATE (4 MONTH) 30 MG IM KIT
30.0000 mg | PACK | Freq: Once | INTRAMUSCULAR | Status: AC
  Administered 2018-06-01: 30 mg via INTRAMUSCULAR
  Filled 2018-06-01: qty 30

## 2018-06-01 NOTE — Progress Notes (Signed)
Hematology and Oncology Follow Up Visit  Richard [name] [id number] September 01, 1945 72 y.o. 06/01/2018 8:35 AM    Principle Diagnosis: 72 year old man with biochemically recurring prostate cancer developed in 2016.  He has castration-sensitive disease without any radiographic findings.  He was diagnosed with Gleason score 4+3 = 7 with a PSA of 3.66 in 2006.   Prior Therapy: 1. He is status post retropubic prostatectomy and bilateral lymph node dissection done on 11/25/2004. The pathology showed a Gleason score of 4+4 equals 8 with staging of T2b N0.  2. Sstatus post salvage radiation therapy done between March and May of 2007 for a rise in his PSA to 0.29. He received total of 65 gray in 35 fractions. 3.  He was treated with intermittent Lupron until 2016.  Lupron has been given at that time.  Current therapy:   Lupron 30 mg every 4 months started in April 2016.  Interim History: Richard [name] returns today for a follow-up.  Since her last visit, he reports no recent complaints.  He continues to tolerate Lupron without any delayed or residual complications.  He does report occasional hot flashes but he is not bothered by them.  He denies any excessive fatigue or tiredness.  He denies any weight gain or or worsening of his diabetes.  He remains active with excellent quality of life and performance status.  He is not reporting any headaches, blurry vision or syncope or seizures.  He denies any changes in his mental status or confusion.  He does not report any fevers or chills or sweats.  His weight remains stable.  He is not reporting any chest pain, potation or orthopnea.  He does not report any cough, wheezing or hemoptysis.  He does not report any nausea, vomiting.  He denies any constipation or diarrhea.  He denies any hematochezia or melena.  He denies any frequency urgency or hesitancy.  He does not report any bone pain or pathological fractures.  He does not report any skin rashes or lesions.  He  denies any lymphadenopathy or petechiae.  Remaining review of systems negative.  Medications: I have reviewed the patient's current medications.   Allergies:  Allergies  Allergen Reactions  . Effexor Xr [Venlafaxine Hcl] Rash    Past Medical History, Surgical history, Social history, and Family History updated and remain unchanged.  Marland Kitchen Physical Exam: Blood pressure 111/69, pulse 67, temperature 98 F (36.7 C), temperature source Oral, resp. rate 18, height 6\' 2"  (1.88 m), weight 213 lb 3.2 oz (96.7 kg), SpO2 100 %.   ECOG: 0   General appearance: Comfortable appearing without any discomfort Head: Normocephalic without any trauma Oropharynx: Mucous membranes are moist and pink without any thrush or ulcers. Eyes: Pupils are equal and round reactive to light. Lymph nodes: No cervical, supraclavicular, inguinal or axillary lymphadenopathy.   Heart:regular rate and rhythm.  S1 and S2 without leg edema. Lung: Clear without any rhonchi or wheezes.  No dullness to percussion. Abdomin: Soft, nontender, nondistended with good bowel sounds.  No hepatosplenomegaly. Musculoskeletal: No joint deformity or effusion.  Full range of motion noted. Neurological: No deficits noted on motor, sensory and deep tendon reflex exam. Skin: No petechial rash or dryness.  Appeared moist.    Lab Results: Lab Results  Component Value Date   WBC 5.9 05/30/2018   HGB 11.7 (L) 05/30/2018   HCT 34.7 (L) 05/30/2018   MCV 97.0 05/30/2018   PLT 249 05/30/2018     Chemistry  Component Value Date/Time   NA 139 05/30/2018 0852   NA 140 05/24/2017 0810   K 4.6 05/30/2018 0852   K 4.4 05/24/2017 0810   CL 104 05/30/2018 0852   CL 102 02/20/2013 1254   CO2 27 05/30/2018 0852   CO2 25 05/24/2017 0810   BUN 24 (H) 05/30/2018 0852   BUN 24.7 05/24/2017 0810   CREATININE 1.31 (H) 05/30/2018 0852   CREATININE 1.2 05/24/2017 0810      Component Value Date/Time   CALCIUM 9.5 05/30/2018 0852   CALCIUM  9.3 05/24/2017 0810   ALKPHOS 57 05/30/2018 0852   ALKPHOS 60 05/24/2017 0810   AST 15 05/30/2018 0852   AST 14 05/24/2017 0810   ALT 13 05/30/2018 0852   ALT 9 05/24/2017 0810   BILITOT 0.4 05/30/2018 0852   BILITOT 0.41 05/24/2017 0810      Results for Richard [name] (MRN [id number]) as of 06/01/2018 08:37  Ref. Range 01/24/2018 11:04 05/30/2018 08:52  Prostate Specific Ag, Serum Latest Ref Range: 0.0 - 4.0 ng/mL <0.1 <0.1       Impression and Plan:  72 year old man with:   1.  Biochemically recurring castration-sensitive prostate cancer diagnosed in 2006.  He was initially found to have Gleason score of 4+4 equals 8, PSA at the time diagnosed at 3.66.    He is currently on androgen deprivation long-term continuously.  His PSA remains undetectable without any residual complications related to this therapy.  The risks and benefits of continuing this treatment long-term was reviewed today.  Long-term complication including osteoporosis, hyperlipidemia, weight gain among others.  After discussion today, he is agreeable to continue   2. Diabetes mellitus: Blood sugar under reasonable control.  No recent exacerbation related to androgen deprivation.  3.  Anemia: Related to chronic renal insufficiency and chronic disease.  Iron studies showed improvement as he is taking oral iron without complications.Marland Kitchen  His hemoglobin remains relatively stable and does not require any intervention.  I recommended continuing oral iron intermittently.  He is taking it 3 times a week which appears to be adequate  4. Followup: In 4 months for a follow-up.  15  minutes was spent with the patient face-to-face today.  More than 50% of time was dedicated to reviewing the natural course of his disease, treatment options and discussing long-term complications related to therapy.    Zola Button, MD 10/3/20198:35 AM

## 2018-07-25 ENCOUNTER — Other Ambulatory Visit: Payer: Self-pay | Admitting: Endocrinology

## 2018-07-25 DIAGNOSIS — E119 Type 2 diabetes mellitus without complications: Secondary | ICD-10-CM

## 2018-07-25 DIAGNOSIS — D519 Vitamin B12 deficiency anemia, unspecified: Secondary | ICD-10-CM

## 2018-07-25 DIAGNOSIS — E7800 Pure hypercholesterolemia, unspecified: Secondary | ICD-10-CM

## 2018-07-25 DIAGNOSIS — T50905A Adverse effect of unspecified drugs, medicaments and biological substances, initial encounter: Secondary | ICD-10-CM

## 2018-07-26 ENCOUNTER — Other Ambulatory Visit (INDEPENDENT_AMBULATORY_CARE_PROVIDER_SITE_OTHER): Payer: Medicare Other

## 2018-07-26 DIAGNOSIS — T50905A Adverse effect of unspecified drugs, medicaments and biological substances, initial encounter: Secondary | ICD-10-CM | POA: Diagnosis not present

## 2018-07-26 DIAGNOSIS — E119 Type 2 diabetes mellitus without complications: Secondary | ICD-10-CM

## 2018-07-26 DIAGNOSIS — D519 Vitamin B12 deficiency anemia, unspecified: Secondary | ICD-10-CM

## 2018-07-26 DIAGNOSIS — E7800 Pure hypercholesterolemia, unspecified: Secondary | ICD-10-CM | POA: Diagnosis not present

## 2018-07-26 LAB — LIPID PANEL
CHOL/HDL RATIO: 3
Cholesterol: 161 mg/dL (ref 0–200)
HDL: 60.4 mg/dL (ref >39.00)
LDL CALC: 68 mg/dL (ref 0–99)
NONHDL: 100.23
TRIGLYCERIDES: 162 mg/dL — AB (ref 0.0–149.0)
VLDL: 32.4 mg/dL (ref 0.0–40.0)

## 2018-07-26 LAB — COMPREHENSIVE METABOLIC PANEL
ALBUMIN: 4 g/dL (ref 3.5–5.2)
ALK PHOS: 49 U/L (ref 39–117)
ALT: 11 U/L (ref 0–53)
AST: 12 U/L (ref 0–37)
BILIRUBIN TOTAL: 0.3 mg/dL (ref 0.2–1.2)
BUN: 28 mg/dL — ABNORMAL HIGH (ref 6–23)
CO2: 28 mEq/L (ref 19–32)
Calcium: 9.3 mg/dL (ref 8.4–10.5)
Chloride: 103 mEq/L (ref 96–112)
Creatinine, Ser: 1.31 mg/dL (ref 0.40–1.50)
GFR: 56.92 mL/min — AB (ref >60.00)
Glucose, Bld: 108 mg/dL — ABNORMAL HIGH (ref 70–99)
POTASSIUM: 4.3 meq/L (ref 3.5–5.1)
Sodium: 137 mEq/L (ref 135–145)
Total Protein: 6.4 g/dL (ref 6.0–8.3)

## 2018-07-26 LAB — MICROALBUMIN / CREATININE URINE RATIO
Creatinine,U: 84.3 mg/dL
Microalb Creat Ratio: 23.5 mg/g (ref 0.0–30.0)
Microalb, Ur: 19.8 mg/dL — ABNORMAL HIGH (ref 0.0–1.9)

## 2018-07-26 LAB — HEMOGLOBIN A1C: HEMOGLOBIN A1C: 6.2 % (ref 4.6–6.5)

## 2018-07-26 LAB — VITAMIN B12: Vitamin B-12: 512 pg/mL (ref 211–911)

## 2018-07-30 NOTE — Progress Notes (Signed)
Patient ID: Richard [name], male   DOB: June 23, 1946, 72 y.o.   MRN: [id number]   Reason for Appointment:  Follow-up  History of Present Illness   Problem 1:  DIABETES MELITUS, date of diagnosis 1995 with a glucose of 320 and HemoglobinA1c of 9.6 Previous history: He has been on various oral hypoglycemic drugs in the past but for the last few years has required multiple drugs in combination; diabetes has been generally well controlled with A1c near normal  Recent history:   Oral hypoglycemic drugs: Amaryl 2 mg once a day in a.m., metformin 1000 twice a day, Actos 15 mg daily  His A1c is 6.0, was 6.4 on his last visit in 05/2017  Current management, blood sugar patterns and problems identified:  He is still not checking his blood sugar much at all and does not remember his readings  His weight has gone down since last year although about the same more recently  He has not brought his monitor for download today  He does try to be active although he is not doing much formal exercise  Fasting glucose was just over 100 on the lab and today 94  Does not feel any symptoms of hypoglycemia during the day when he is active  Side effects from medications: None       Monitors blood glucose:  irregularly     Glucometer: One Touch ultra 2 .          Home blood Glucose readings:   Not available   Hypoglycemia:  none                 Physical activity: exercise:  Walking and yardwork   Dietician visit: Most recent:? 2007    Weight control:      Wt Readings from Last 3 Encounters:  07/31/18 213 lb 9.6 oz (96.9 kg)  06/01/18 213 lb 3.2 oz (96.7 kg)  01/25/18 220 lb 8 oz (100 kg)    Diabetes labs:  Lab Results  Component Value Date   HGBA1C 6.2 07/26/2018   HGBA1C 6.4 06/07/2017   HGBA1C 6.5 02/04/2017   Lab Results  Component Value Date   MICROALBUR 19.8 (H) 07/26/2018   LDLCALC 68 07/26/2018   CREATININE 1.31 07/26/2018    Other problems: See review of  systems   Office Visit on 07/31/2018  Component Date Value Ref Range Status  . POC Glucose 07/31/2018 94  70 - 99 mg/dl Final  Lab on 07/26/2018  Component Date Value Ref Range Status  . Vitamin B-12 07/26/2018 512  211 - 911 pg/mL Final  . Cholesterol 07/26/2018 161  0 - 200 mg/dL Final   ATP III Classification       Desirable:  < 200 mg/dL               Borderline High:  200 - 239 mg/dL          High:  > = 240 mg/dL  . Triglycerides 07/26/2018 162.0* 0.0 - 149.0 mg/dL Final   Normal:  <150 mg/dLBorderline High:  150 - 199 mg/dL  . HDL 07/26/2018 60.40  >39.00 mg/dL Final  . VLDL 07/26/2018 32.4  0.0 - 40.0 mg/dL Final  . LDL Cholesterol 07/26/2018 68  0 - 99 mg/dL Final  . Total CHOL/HDL Ratio 07/26/2018 3   Final                  Men  Women1/2 Average Risk     3.4          3.3Average Risk          5.0          4.42X Average Risk          9.6          7.13X Average Risk          15.0          11.0                      . NonHDL 07/26/2018 100.23   Final   NOTE:  Non-HDL goal should be 30 mg/dL higher than patient's LDL goal (i.e. LDL goal of < 70 mg/dL, would have non-HDL goal of < 100 mg/dL)  . Microalb, Ur 07/26/2018 19.8* 0.0 - 1.9 mg/dL Final  . Creatinine,U 07/26/2018 84.3  mg/dL Final  . Microalb Creat Ratio 07/26/2018 23.5  0.0 - 30.0 mg/g Final  . Sodium 07/26/2018 137  135 - 145 mEq/L Final  . Potassium 07/26/2018 4.3  3.5 - 5.1 mEq/L Final  . Chloride 07/26/2018 103  96 - 112 mEq/L Final  . CO2 07/26/2018 28  19 - 32 mEq/L Final  . Glucose, Bld 07/26/2018 108* 70 - 99 mg/dL Final  . BUN 07/26/2018 28* 6 - 23 mg/dL Final  . Creatinine, Ser 07/26/2018 1.31  0.40 - 1.50 mg/dL Final  . Total Bilirubin 07/26/2018 0.3  0.2 - 1.2 mg/dL Final  . Alkaline Phosphatase 07/26/2018 49  39 - 117 U/L Final  . AST 07/26/2018 12  0 - 37 U/L Final  . ALT 07/26/2018 11  0 - 53 U/L Final  . Total Protein 07/26/2018 6.4  6.0 - 8.3 g/dL Final  . Albumin 07/26/2018 4.0  3.5 - 5.2 g/dL  Final  . Calcium 07/26/2018 9.3  8.4 - 10.5 mg/dL Final  . GFR 07/26/2018 56.92* >60.00 mL/min Final  . Hgb A1c MFr Bld 07/26/2018 6.2  4.6 - 6.5 % Final   Glycemic Control Guidelines for People with Diabetes:Non Diabetic:  <6%Goal of Therapy: <7%Additional Action Suggested:  >8%      Allergies as of 07/31/2018      Reactions   Effexor Xr [venlafaxine Hcl] Rash      Medication List        Accurate as of 07/31/18  8:04 AM. Always use your most recent med list.          amLODipine 5 MG tablet Commonly known as:  NORVASC TAKE 1 TABLET(5 MG) BY MOUTH DAILY   atorvastatin 10 MG tablet Commonly known as:  LIPITOR TAKE 1 TABLET(10 MG) BY MOUTH DAILY   glimepiride 2 MG tablet Commonly known as:  AMARYL TAKE 1 TABLET (2 MG TOTAL) BY MOUTH DAILY WITH BREAKFAST.   glucose blood test strip Use as instructed to check blood sugar 3 times per week dx code E11.9   LUPRON DEPOT (25-MONTH) IM Inject into the muscle.   metFORMIN 1000 MG tablet Commonly known as:  GLUCOPHAGE TAKE 1 TABLET(1000 MG) BY MOUTH TWICE DAILY WITH A MEAL   ONE TOUCH ULTRA 2 w/Device Kit Use to check blood sugar 3 times per week dx code V78.5   ONETOUCH DELICA LANCETS FINE Misc Use to check blood sugar 3 times per week dx code E11.9   pioglitazone 30 MG tablet Commonly known as:  ACTOS TAKE 1 TABLET(30 MG) BY MOUTH DAILY   ramipril 5  MG capsule Commonly known as:  ALTACE TAKE 1 CAPSULE (10 MG TOTAL) BY MOUTH DAILY.   sertraline 50 MG tablet Commonly known as:  ZOLOFT Take 50 mg by mouth daily.   triamterene-hydrochlorothiazide 37.5-25 MG capsule Commonly known as:  DYAZIDE Take 1 each (1 capsule total) by mouth daily.       Allergies:  Allergies  Allergen Reactions  . Effexor Xr [Venlafaxine Hcl] Rash    Past Medical History:  Diagnosis Date  . Diabetes mellitus without complication ([place])   . Hypertension   . prostate ca dx'd 2006   prostatectomy and xrt    History reviewed. No  pertinent surgical history.  Family History  Problem Relation Age of Onset  . Hypertension Father   . Heart disease Neg Hx   . Diabetes Neg Hx     Social History:  reports that he has been smoking. He has never used smokeless tobacco. His alcohol and drug histories are not on file.    REVIEW OF SYSTEMS:       Hypertension:  blood pressure has been  well controlled with 5 mg ramipril, amlodipine 5 mg and Dyazide does not monitor blood pressure at home regularly   BP Readings from Last 3 Encounters:  07/31/18 118/68  06/01/18 111/69  01/25/18 119/66    Lipids:  he has been treated with Lipitor 10 mg LDL is controlled as below   Lab Results  Component Value Date   CHOL 161 07/26/2018   HDL 60.40 07/26/2018   LDLCALC 68 07/26/2018   TRIG 162.0 (H) 07/26/2018   CHOLHDL 3 07/26/2018     History of vitamin B12 deficiency,he is taking this regularly Has mild chronic anemia  CBC followed by oncologist also  Lab Results  Component Value Date   WBC 5.9 05/30/2018   HGB 11.7 (L) 05/30/2018   HCT 34.7 (L) 05/30/2018   MCV 97.0 05/30/2018   PLT 249 05/30/2018     He is still taking sertraline from his PCP for depression   LABS:  Office Visit on 07/31/2018  Component Date Value Ref Range Status  . POC Glucose 07/31/2018 94  70 - 99 mg/dl Final  Lab on 07/26/2018  Component Date Value Ref Range Status  . Vitamin B-12 07/26/2018 512  211 - 911 pg/mL Final  . Cholesterol 07/26/2018 161  0 - 200 mg/dL Final   ATP III Classification       Desirable:  < 200 mg/dL               Borderline High:  200 - 239 mg/dL          High:  > = 240 mg/dL  . Triglycerides 07/26/2018 162.0* 0.0 - 149.0 mg/dL Final   Normal:  <150 mg/dLBorderline High:  150 - 199 mg/dL  . HDL 07/26/2018 60.40  >39.00 mg/dL Final  . VLDL 07/26/2018 32.4  0.0 - 40.0 mg/dL Final  . LDL Cholesterol 07/26/2018 68  0 - 99 mg/dL Final  . Total CHOL/HDL Ratio 07/26/2018 3   Final                  Men           Women1/2 Average Risk     3.4          3.3Average Risk          5.0          4.42X Average Risk  9.6          7.13X Average Risk          15.0          11.0                      . NonHDL 07/26/2018 100.23   Final   NOTE:  Non-HDL goal should be 30 mg/dL higher than patient's LDL goal (i.e. LDL goal of < 70 mg/dL, would have non-HDL goal of < 100 mg/dL)  . Microalb, Ur 07/26/2018 19.8* 0.0 - 1.9 mg/dL Final  . Creatinine,U 07/26/2018 84.3  mg/dL Final  . Microalb Creat Ratio 07/26/2018 23.5  0.0 - 30.0 mg/g Final  . Sodium 07/26/2018 137  135 - 145 mEq/L Final  . Potassium 07/26/2018 4.3  3.5 - 5.1 mEq/L Final  . Chloride 07/26/2018 103  96 - 112 mEq/L Final  . CO2 07/26/2018 28  19 - 32 mEq/L Final  . Glucose, Bld 07/26/2018 108* 70 - 99 mg/dL Final  . BUN 07/26/2018 28* 6 - 23 mg/dL Final  . Creatinine, Ser 07/26/2018 1.31  0.40 - 1.50 mg/dL Final  . Total Bilirubin 07/26/2018 0.3  0.2 - 1.2 mg/dL Final  . Alkaline Phosphatase 07/26/2018 49  39 - 117 U/L Final  . AST 07/26/2018 12  0 - 37 U/L Final  . ALT 07/26/2018 11  0 - 53 U/L Final  . Total Protein 07/26/2018 6.4  6.0 - 8.3 g/dL Final  . Albumin 07/26/2018 4.0  3.5 - 5.2 g/dL Final  . Calcium 07/26/2018 9.3  8.4 - 10.5 mg/dL Final  . GFR 07/26/2018 56.92* >60.00 mL/min Final  . Hgb A1c MFr Bld 07/26/2018 6.2  4.6 - 6.5 % Final   Glycemic Control Guidelines for People with Diabetes:Non Diabetic:  <6%Goal of Therapy: <7%Additional Action Suggested:  >8%      Examination:   BP 118/68 (BP Location: Left Arm, Patient Position: Sitting, Cuff Size: Normal)   Pulse 66   Ht '6\' 2"'  (1.88 m)   Wt 213 lb 9.6 oz (96.9 kg)   SpO2 98%   BMI 27.42 kg/m   Body mass index is 27.42 kg/m.     ASSESSMENT/ PLAN:    Diabetes type 2 with mild obesity:   See history of present illness for detailed discussion of current diabetes management, blood sugar patterns and problems identified   His A1c is consistently in the upper normal range  and 6.29  His weight has improved slightly since his last visit Although he is not doing any formal exercise he is staying active Blood sugars have been fairly consistent and he does not monitor at home Also not reporting hypoglycemia with taking Amaryl He can again continue on his regimen of metformin, Amaryl and Actos  Hypertension: Blood pressure is well controlled with his 3 drug regimen  LIPIDS: LDL well controlled  B12 deficiency: Adequately replaced follow-up in 6 months   Locklyn Henriquez 07/31/2018, 8:04 AM     Note: This office note was prepared with Dragon voice recognition system technology. Any transcriptional errors that result from this process are unintentional.

## 2018-07-31 ENCOUNTER — Ambulatory Visit (INDEPENDENT_AMBULATORY_CARE_PROVIDER_SITE_OTHER): Payer: Medicare Other | Admitting: Endocrinology

## 2018-07-31 ENCOUNTER — Encounter: Payer: Self-pay | Admitting: Endocrinology

## 2018-07-31 VITALS — BP 118/68 | HR 66 | Ht 74.0 in | Wt 213.6 lb

## 2018-07-31 DIAGNOSIS — E7800 Pure hypercholesterolemia, unspecified: Secondary | ICD-10-CM

## 2018-07-31 DIAGNOSIS — E119 Type 2 diabetes mellitus without complications: Secondary | ICD-10-CM | POA: Diagnosis not present

## 2018-07-31 LAB — GLUCOSE, POCT (MANUAL RESULT ENTRY): POC Glucose: 94 mg/dl (ref 70–99)

## 2018-10-03 ENCOUNTER — Inpatient Hospital Stay: Payer: Medicare Other | Attending: Oncology

## 2018-10-03 DIAGNOSIS — Z5111 Encounter for antineoplastic chemotherapy: Secondary | ICD-10-CM | POA: Insufficient documentation

## 2018-10-03 DIAGNOSIS — C61 Malignant neoplasm of prostate: Secondary | ICD-10-CM | POA: Diagnosis present

## 2018-10-03 DIAGNOSIS — D649 Anemia, unspecified: Secondary | ICD-10-CM | POA: Insufficient documentation

## 2018-10-03 DIAGNOSIS — E119 Type 2 diabetes mellitus without complications: Secondary | ICD-10-CM | POA: Diagnosis not present

## 2018-10-03 LAB — CMP (CANCER CENTER ONLY)
ALBUMIN: 3.9 g/dL (ref 3.5–5.0)
ALT: 12 U/L (ref 0–44)
AST: 15 U/L (ref 15–41)
Alkaline Phosphatase: 60 U/L (ref 38–126)
Anion gap: 8 (ref 5–15)
BUN: 33 mg/dL — AB (ref 8–23)
CHLORIDE: 106 mmol/L (ref 98–111)
CO2: 26 mmol/L (ref 22–32)
Calcium: 9.4 mg/dL (ref 8.9–10.3)
Creatinine: 1.5 mg/dL — ABNORMAL HIGH (ref 0.61–1.24)
GFR, EST NON AFRICAN AMERICAN: 46 mL/min — AB (ref >60)
GFR, Est AFR Am: 53 mL/min — ABNORMAL LOW (ref >60)
GLUCOSE: 78 mg/dL (ref 70–99)
POTASSIUM: 4.9 mmol/L (ref 3.5–5.1)
SODIUM: 140 mmol/L (ref 135–145)
Total Bilirubin: 0.5 mg/dL (ref 0.3–1.2)
Total Protein: 6.6 g/dL (ref 6.5–8.1)

## 2018-10-03 LAB — CBC WITH DIFFERENTIAL (CANCER CENTER ONLY)
Abs Immature Granulocytes: 0.03 10*3/uL (ref 0.00–0.07)
Basophils Absolute: 0.1 10*3/uL (ref 0.0–0.1)
Basophils Relative: 1 %
Eosinophils Absolute: 0.2 10*3/uL (ref 0.0–0.5)
Eosinophils Relative: 3 %
HCT: 35.1 % — ABNORMAL LOW (ref 39.0–52.0)
Hemoglobin: 11.3 g/dL — ABNORMAL LOW (ref 13.0–17.0)
Immature Granulocytes: 0 %
Lymphocytes Relative: 29 %
Lymphs Abs: 2 10*3/uL (ref 0.7–4.0)
MCH: 32.1 pg (ref 26.0–34.0)
MCHC: 32.2 g/dL (ref 30.0–36.0)
MCV: 99.7 fL (ref 80.0–100.0)
Monocytes Absolute: 0.6 10*3/uL (ref 0.1–1.0)
Monocytes Relative: 9 %
NRBC: 0 % (ref 0.0–0.2)
Neutro Abs: 3.9 10*3/uL (ref 1.7–7.7)
Neutrophils Relative %: 58 %
PLATELETS: 228 10*3/uL (ref 150–400)
RBC: 3.52 MIL/uL — ABNORMAL LOW (ref 4.22–5.81)
RDW: 12.7 % (ref 11.5–15.5)
WBC Count: 6.8 10*3/uL (ref 4.0–10.5)

## 2018-10-04 LAB — PROSTATE-SPECIFIC AG, SERUM (LABCORP): Prostate Specific Ag, Serum: <0.1 ng/mL (ref 0.0–4.0)

## 2018-10-05 ENCOUNTER — Telehealth: Payer: Self-pay

## 2018-10-05 ENCOUNTER — Inpatient Hospital Stay: Payer: Medicare Other

## 2018-10-05 ENCOUNTER — Inpatient Hospital Stay (HOSPITAL_BASED_OUTPATIENT_CLINIC_OR_DEPARTMENT_OTHER): Payer: Medicare Other | Admitting: Oncology

## 2018-10-05 VITALS — BP 137/63 | HR 67 | Temp 98.8°F | Resp 17 | Ht 74.0 in | Wt 219.3 lb

## 2018-10-05 DIAGNOSIS — E119 Type 2 diabetes mellitus without complications: Secondary | ICD-10-CM

## 2018-10-05 DIAGNOSIS — Z8546 Personal history of malignant neoplasm of prostate: Secondary | ICD-10-CM

## 2018-10-05 DIAGNOSIS — C61 Malignant neoplasm of prostate: Secondary | ICD-10-CM | POA: Diagnosis not present

## 2018-10-05 DIAGNOSIS — D649 Anemia, unspecified: Secondary | ICD-10-CM | POA: Diagnosis not present

## 2018-10-05 DIAGNOSIS — Z5111 Encounter for antineoplastic chemotherapy: Secondary | ICD-10-CM | POA: Diagnosis not present

## 2018-10-05 MED ORDER — LEUPROLIDE ACETATE (3 MONTH) 22.5 MG IM KIT
PACK | INTRAMUSCULAR | Status: AC
  Filled 2018-10-05: qty 22.5

## 2018-10-05 MED ORDER — LEUPROLIDE ACETATE (4 MONTH) 30 MG IM KIT
30.0000 mg | PACK | Freq: Once | INTRAMUSCULAR | Status: AC
  Administered 2018-10-05: 30 mg via INTRAMUSCULAR
  Filled 2018-10-05: qty 30

## 2018-10-05 NOTE — Telephone Encounter (Signed)
Patient declined avs and calender due to MyChart. Per 2/6 los

## 2018-10-05 NOTE — Progress Notes (Signed)
Hematology and Oncology Follow Up Visit  Richard [name] [id number] [birth year] 73 y.o. 10/05/2018 8:29 AM    Principle Diagnosis: 73 year old man with prostate cancer diagnosed in 2006 with Gleason score 4+3 = 7 with a PSA of 3.66.  He developed biochemical relapse in 2016.  Prior Therapy: 1. He is status post retropubic prostatectomy and bilateral lymph node dissection done on 11/25/2004. The pathology showed a Gleason score of 4+4 equals 8 with staging of T2b N0.  2. Sstatus post salvage radiation therapy done between March and May of 2007 for a rise in his PSA to 0.29. He received total of 65 gray in 35 fractions. 3.  He was treated with intermittent Lupron until 2016.  Lupron has been given at that time.  Current therapy:   Lupron 30 mg every 4 months started in April 2016.  Interim History: Richard [name] is here for a follow-up visit.  Since the last visit, he reports no major changes in his health.  Continues to be active and attends activities of daily living.  He denies any complications related to Lupron including no hot flashes or excessive fatigue or tiredness.  He reports reasonable appetite and again few pounds since the last visit.  He denies any bone pain or pathological fractures.   Patient denied any alteration mental status, neuropathy, confusion or dizziness.  Denies any headaches or lethargy.  Denies any night sweats, weight loss or changes in appetite.  Denied orthopnea, dyspnea on exertion or chest discomfort.  Denies shortness of breath, difficulty breathing hemoptysis or cough.  Denies any abdominal distention, nausea, early satiety or dyspepsia.  Denies any hematuria, frequency, dysuria or nocturia.  Denies any skin irritation, dryness or rash.  Denies any ecchymosis or petechiae.  Denies any lymphadenopathy or clotting.  Denies any heat or cold intolerance.  Denies any anxiety or depression.  Remaining review of system is negative.     Medications: I have reviewed the  patient's current medications.   Allergies:  Allergies  Allergen Reactions  . Effexor Xr [Venlafaxine Hcl] Rash    Past Medical History, Surgical history, Social history, and Family History updated and remain unchanged.  Marland Kitchen Physical Exam:  Blood pressure 137/63, pulse 67, temperature 98.8 F (37.1 C), temperature source Oral, resp. rate 17, height 6\' 2"  (1.88 m), weight 219 lb 4.8 oz (99.5 kg), SpO2 100 %.   ECOG: 0    General appearance: Alert, awake without any distress. Head: Atraumatic without abnormalities Oropharynx: Without any thrush or ulcers. Eyes: No scleral icterus. Lymph nodes: No lymphadenopathy noted in the cervical, supraclavicular, or axillary nodes Heart:regular rate and rhythm, without any murmurs or gallops.   Lung: Clear to auscultation without any rhonchi, wheezes or dullness to percussion. Abdomin: Soft, nontender without any shifting dullness or ascites. Musculoskeletal: No clubbing or cyanosis. Neurological: No motor or sensory deficits. Skin: No rashes or lesions.    Lab Results: Lab Results  Component Value Date   WBC 6.8 10/03/2018   HGB 11.3 (L) 10/03/2018   HCT 35.1 (L) 10/03/2018   MCV 99.7 10/03/2018   PLT 228 10/03/2018     Chemistry      Component Value Date/Time   NA 140 10/03/2018 0851   NA 140 05/24/2017 0810   K 4.9 10/03/2018 0851   K 4.4 05/24/2017 0810   CL 106 10/03/2018 0851   CL 102 02/20/2013 1254   CO2 26 10/03/2018 0851   CO2 25 05/24/2017 0810   BUN 33 (H) 10/03/2018 9371  BUN 24.7 05/24/2017 0810   CREATININE 1.50 (H) 10/03/2018 0851   CREATININE 1.2 05/24/2017 0810      Component Value Date/Time   CALCIUM 9.4 10/03/2018 0851   CALCIUM 9.3 05/24/2017 0810   ALKPHOS 60 10/03/2018 0851   ALKPHOS 60 05/24/2017 0810   AST 15 10/03/2018 0851   AST 14 05/24/2017 0810   ALT 12 10/03/2018 0851   ALT 9 05/24/2017 0810   BILITOT 0.5 10/03/2018 0851   BILITOT 0.41 05/24/2017 0810       Results for  Richard Kass "MIKE" (MRN [id number]) as of 10/05/2018 08:22  Ref. Range 05/30/2018 08:52 10/03/2018 08:51  Prostate Specific Ag, Serum Latest Ref Range: 0.0 - 4.0 ng/mL <0.1 <0.1       Impression and Plan:  73 year old man with:   1.  Prostate cancer diagnosed in 2006 and subsequently developed biochemical relapse in 2016.   He continues to receive androgen deprivation therapy without any complaints.  His PSA remains undetectable and therapy has been well-tolerated.  Long-term complication associated with this therapy was reiterated which includes weight gain, hot flashes, sexual dysfunction among others.  To discussion is agreeable to continue.  2. Diabetes mellitus: No issues or exacerbation related to androgen deprivation.  Blood sugar remains adequate.  3.  Anemia: Hemoglobin remains stable without any issues or concerns at this time.  Iron levels from October 2019 were adequate.  We will continue to monitor periodically.  4. Followup: In 4 months for for a Lupron injection and reevaluation.  15  minutes was spent with the patient face-to-face today.  More than 50% of time was dedicated to discussing his disease status, treatment options and managing complications related therapy.    Zola Button, MD 2/6/20208:29 AM

## 2019-01-26 ENCOUNTER — Other Ambulatory Visit: Payer: Medicare Other

## 2019-01-30 ENCOUNTER — Ambulatory Visit: Payer: Medicare Other | Admitting: Endocrinology

## 2019-02-06 ENCOUNTER — Other Ambulatory Visit: Payer: Self-pay

## 2019-02-06 ENCOUNTER — Inpatient Hospital Stay: Payer: Medicare Other | Attending: Oncology

## 2019-02-06 DIAGNOSIS — C61 Malignant neoplasm of prostate: Secondary | ICD-10-CM | POA: Insufficient documentation

## 2019-02-06 DIAGNOSIS — Z5111 Encounter for antineoplastic chemotherapy: Secondary | ICD-10-CM | POA: Insufficient documentation

## 2019-02-06 DIAGNOSIS — E119 Type 2 diabetes mellitus without complications: Secondary | ICD-10-CM | POA: Diagnosis not present

## 2019-02-06 DIAGNOSIS — D630 Anemia in neoplastic disease: Secondary | ICD-10-CM | POA: Insufficient documentation

## 2019-02-06 LAB — CBC WITH DIFFERENTIAL (CANCER CENTER ONLY)
Abs Immature Granulocytes: 0.02 10*3/uL (ref 0.00–0.07)
Basophils Absolute: 0.1 10*3/uL (ref 0.0–0.1)
Basophils Relative: 1 %
Eosinophils Absolute: 0.3 10*3/uL (ref 0.0–0.5)
Eosinophils Relative: 4 %
HCT: 35.5 % — ABNORMAL LOW (ref 39.0–52.0)
Hemoglobin: 11.4 g/dL — ABNORMAL LOW (ref 13.0–17.0)
Immature Granulocytes: 0 %
Lymphocytes Relative: 31 %
Lymphs Abs: 2.2 10*3/uL (ref 0.7–4.0)
MCH: 31.8 pg (ref 26.0–34.0)
MCHC: 32.1 g/dL (ref 30.0–36.0)
MCV: 99.2 fL (ref 80.0–100.0)
Monocytes Absolute: 0.8 10*3/uL (ref 0.1–1.0)
Monocytes Relative: 10 %
Neutro Abs: 4 10*3/uL (ref 1.7–7.7)
Neutrophils Relative %: 54 %
Platelet Count: 259 10*3/uL (ref 150–400)
RBC: 3.58 MIL/uL — ABNORMAL LOW (ref 4.22–5.81)
RDW: 12.1 % (ref 11.5–15.5)
WBC Count: 7.3 10*3/uL (ref 4.0–10.5)
nRBC: 0 % (ref 0.0–0.2)

## 2019-02-06 LAB — CMP (CANCER CENTER ONLY)
ALT: 10 U/L (ref 0–44)
AST: 13 U/L — ABNORMAL LOW (ref 15–41)
Albumin: 4 g/dL (ref 3.5–5.0)
Alkaline Phosphatase: 58 U/L (ref 38–126)
Anion gap: 10 (ref 5–15)
BUN: 42 mg/dL — ABNORMAL HIGH (ref 8–23)
CO2: 25 mmol/L (ref 22–32)
Calcium: 9.2 mg/dL (ref 8.9–10.3)
Chloride: 105 mmol/L (ref 98–111)
Creatinine: 1.67 mg/dL — ABNORMAL HIGH (ref 0.61–1.24)
GFR, Est AFR Am: 46 mL/min — ABNORMAL LOW (ref >60)
GFR, Estimated: 40 mL/min — ABNORMAL LOW (ref >60)
Glucose, Bld: 98 mg/dL (ref 70–99)
Potassium: 4.7 mmol/L (ref 3.5–5.1)
Sodium: 140 mmol/L (ref 135–145)
Total Bilirubin: 0.4 mg/dL (ref 0.3–1.2)
Total Protein: 6.8 g/dL (ref 6.5–8.1)

## 2019-02-07 ENCOUNTER — Inpatient Hospital Stay: Payer: Medicare Other

## 2019-02-07 ENCOUNTER — Other Ambulatory Visit: Payer: Self-pay

## 2019-02-07 ENCOUNTER — Ambulatory Visit: Payer: Medicare Other

## 2019-02-07 ENCOUNTER — Inpatient Hospital Stay (HOSPITAL_BASED_OUTPATIENT_CLINIC_OR_DEPARTMENT_OTHER): Payer: Medicare Other | Admitting: Oncology

## 2019-02-07 VITALS — BP 131/65 | HR 74 | Temp 98.3°F | Resp 18 | Ht 74.0 in | Wt 212.8 lb

## 2019-02-07 DIAGNOSIS — E119 Type 2 diabetes mellitus without complications: Secondary | ICD-10-CM | POA: Diagnosis not present

## 2019-02-07 DIAGNOSIS — C61 Malignant neoplasm of prostate: Secondary | ICD-10-CM

## 2019-02-07 DIAGNOSIS — Z5111 Encounter for antineoplastic chemotherapy: Secondary | ICD-10-CM | POA: Diagnosis not present

## 2019-02-07 DIAGNOSIS — Z8546 Personal history of malignant neoplasm of prostate: Secondary | ICD-10-CM

## 2019-02-07 DIAGNOSIS — D630 Anemia in neoplastic disease: Secondary | ICD-10-CM

## 2019-02-07 LAB — PROSTATE-SPECIFIC AG, SERUM (LABCORP): Prostate Specific Ag, Serum: <0.1 ng/mL (ref 0.0–4.0)

## 2019-02-07 MED ORDER — LEUPROLIDE ACETATE (4 MONTH) 30 MG IM KIT
30.0000 mg | PACK | Freq: Once | INTRAMUSCULAR | Status: AC
  Administered 2019-02-07: 30 mg via INTRAMUSCULAR
  Filled 2019-02-07: qty 30

## 2019-02-07 NOTE — Patient Instructions (Signed)
Leuprolide depot injection or implant What is this medicine? LEUPROLIDE (loo PROE lide) is a man-made protein that acts like a natural hormone in the body. It decreases testosterone in men and decreases estrogen in women. In men, this medicine is used to treat advanced prostate cancer. In women, some forms of this medicine may be used to treat endometriosis, uterine fibroids, or other male hormone-related problems. This medicine may be used for other purposes; ask your health care provider or pharmacist if you have questions. COMMON BRAND NAME(S): Eligard, Lupron Depot, Lupron Depot-Ped, Viadur What should I tell my health care provider before I take this medicine? They need to know if you have any of these conditions: -diabetes -heart disease or previous heart attack -high blood pressure -high cholesterol -osteoporosis -pain or difficulty passing urine -spinal cord metastasis -stroke -tobacco smoker -unusual vaginal bleeding (women) -an unusual or allergic reaction to leuprolide, benzyl alcohol, other medicines, foods, dyes, or preservatives -pregnant or trying to get pregnant -breast-feeding How should I use this medicine? This medicine is for injection into a muscle or for implant or injection under the skin. It is given by a health care professional in a hospital or clinic setting. The specific product will determine how it will be given to you. Make sure you understand which product you receive and how often you will receive it. Talk to your pediatrician regarding the use of this medicine in children. Special care may be needed. Overdosage: If you think you have taken too much of this medicine contact a poison control center or emergency room at once. NOTE: This medicine is only for you. Do not share this medicine with others. What if I miss a dose? It is important not to miss a dose. Call your doctor or health care professional if you are unable to keep an appointment. Depot  injections: Depot injections are given either once-monthly, every 12 weeks, every 16 weeks, or every 24 weeks depending on the product you are prescribed. The product you are prescribed will be based on if you are male or male, and your condition. Make sure you understand your product and dosing. Implant dosing: The implant is removed and replaced once a year. The implant is only used in males. What may interact with this medicine? Do not take this medicine with any of the following medications: -chasteberry This medicine may also interact with the following medications: -herbal or dietary supplements, like black cohosh or DHEA -male hormones, like estrogens or progestins and birth control pills, patches, rings, or injections -male hormones, like testosterone This list may not describe all possible interactions. Give your health care provider a list of all the medicines, herbs, non-prescription drugs, or dietary supplements you use. Also tell them if you smoke, drink alcohol, or use illegal drugs. Some items may interact with your medicine. What should I watch for while using this medicine? Visit your doctor or health care professional for regular checks on your progress. During the first weeks of treatment, your symptoms may get worse, but then will improve as you continue your treatment. You may get hot flashes, increased bone pain, increased difficulty passing urine, or an aggravation of nerve symptoms. Discuss these effects with your doctor or health care professional, some of them may improve with continued use of this medicine. Male patients may experience a menstrual cycle or spotting during the first months of therapy with this medicine. If this continues, contact your doctor or health care professional. What side effects may I notice from   receiving this medicine? Side effects that you should report to your doctor or health care professional as soon as possible: -allergic reactions like  skin rash, itching or hives, swelling of the face, lips, or tongue -breathing problems -chest pain -depression or memory disorders -pain in your legs or groin -pain at site where injected or implanted -severe headache -swelling of the feet and legs -visual changes -vomiting Side effects that usually do not require medical attention (report to your doctor or health care professional if they continue or are bothersome): -breast swelling or tenderness -decrease in sex drive or performance -diarrhea -hot flashes -loss of appetite -muscle, joint, or bone pains -nausea -redness or irritation at site where injected or implanted -skin problems or acne This list may not describe all possible side effects. Call your doctor for medical advice about side effects. You may report side effects to FDA at 1-800-FDA-1088. Where should I keep my medicine? This drug is given in a hospital or clinic and will not be stored at home. NOTE: This sheet is a summary. It may not cover all possible information. If you have questions about this medicine, talk to your doctor, pharmacist, or health care provider.  2015, [employer] Standard. (2010-02-17 14:41:21)  

## 2019-02-07 NOTE — Progress Notes (Signed)
Hematology and Oncology Follow Up Visit  Richard [name] [id number] 24-Jul-1946 73 y.o. 02/07/2019 10:25 AM    Principle Diagnosis: 73 year old man with prostate cancer diagnosed in 2016 with biochemical relapse.  He was initially diagnosed in 2006 with Gleason score 4+3 = 7 with a PSA of 3.66.  Prior Therapy: 1. He is status post retropubic prostatectomy and bilateral lymph node dissection done on 11/25/2004. The pathology showed a Gleason score of 4+4 equals 8 with staging of T2b N0.  2. Sstatus post salvage radiation therapy done between March and May of 2007 for a rise in his PSA to 0.29. He received total of 65 gray in 35 fractions. 3.  He was treated with intermittent Lupron until 2016.  Lupron has been given at that time.  Current therapy:   Lupron 30 mg every 4 months started in April 2016.  Interim History: Richard [name] returns today for a repeat evaluation.  Since the last visit, he reports no major changes in his health.  He continues to tolerate Lupron without any new complications.  He denies any worsening fatigue, tiredness, weight gain or hot flashes.  Continues to be active and attends to activities of daily living.  Denies any worsening bone pain or recent hospitalizations.   He denied headaches, blurry vision, syncope or seizures.  Denies any fevers, chills or sweats.  Denied chest pain, palpitation, orthopnea or leg edema.  Denied cough, wheezing or hemoptysis.  Denied nausea, vomiting or abdominal pain.  Denies any constipation or diarrhea.  Denies any frequency urgency or hesitancy.  Denies any arthralgias or myalgias.  Denies any skin rashes or lesions.  Denies any bleeding or clotting tendency.  Denies any easy bruising.  Denies any hair or nail changes.  Denies any anxiety or depression.  Remaining review of system is negative.       Medications: I have reviewed the patient's current medications.   Allergies:  Allergies  Allergen Reactions  . Effexor Xr  [Venlafaxine Hcl] Rash    Past Medical History, Surgical history, Social history, and Family History updated and remain unchanged.  Marland Kitchen Physical Exam:  Blood pressure 131/65, pulse 74, temperature 98.3 F (36.8 C), temperature source Oral, resp. rate 18, height 6\' 2"  (1.88 m), weight 212 lb 12.8 oz (96.5 kg), SpO2 99 %.    ECOG: 0   General appearance: Comfortable appearing without any discomfort Head: Normocephalic without any trauma Oropharynx: Mucous membranes are moist and pink without any thrush or ulcers. Eyes: Pupils are equal and round reactive to light. Lymph nodes: No cervical, supraclavicular, inguinal or axillary lymphadenopathy.   Heart:regular rate and rhythm.  S1 and S2 without leg edema. Lung: Clear without any rhonchi or wheezes.  No dullness to percussion. Abdomin: Soft, nontender, nondistended with good bowel sounds.  No hepatosplenomegaly. Musculoskeletal: No joint deformity or effusion.  Full range of motion noted. Neurological: No deficits noted on motor, sensory and deep tendon reflex exam. Skin: No petechial rash or dryness.  Appeared moist.      Lab Results: Lab Results  Component Value Date   WBC 7.3 02/06/2019   HGB 11.4 (L) 02/06/2019   HCT 35.5 (L) 02/06/2019   MCV 99.2 02/06/2019   PLT 259 02/06/2019     Chemistry      Component Value Date/Time   NA 140 02/06/2019 0809   NA 140 05/24/2017 0810   K 4.7 02/06/2019 0809   K 4.4 05/24/2017 0810   CL 105 02/06/2019 0809   CL 102  02/20/2013 1254   CO2 25 02/06/2019 0809   CO2 25 05/24/2017 0810   BUN 42 (H) 02/06/2019 0809   BUN 24.7 05/24/2017 0810   CREATININE 1.67 (H) 02/06/2019 0809   CREATININE 1.2 05/24/2017 0810      Component Value Date/Time   CALCIUM 9.2 02/06/2019 0809   CALCIUM 9.3 05/24/2017 0810   ALKPHOS 58 02/06/2019 0809   ALKPHOS 60 05/24/2017 0810   AST 13 (L) 02/06/2019 0809   AST 14 05/24/2017 0810   ALT 10 02/06/2019 0809   ALT 9 05/24/2017 0810   BILITOT 0.4  02/06/2019 0809   BILITOT 0.41 05/24/2017 0810         Results for Richard [name] "Richard [name]" (MRN [id number]) as of 02/07/2019 10:28  Ref. Range 02/06/2019 08:09  Prostate Specific Ag, Serum Latest Ref Range: 0.0 - 4.0 ng/mL <0.1      Impression and Plan:  73 year old man with:   1.  Prostate cancer with biochemical relapse documented in 2016.  He has no measurable disease at this time.    He has tolerated androgen deprivation therapy without any major complaints.  His PSA continues to be undetectable with this strategy.  Risks and benefits of continuing this approach versus intermittent approach was reviewed.  Potential complications including weight gain, osteoporosis, hot flashes and sexual dysfunction were reiterated.  He is agreeable to continue.  2. Diabetes mellitus: No issues reported or recent exacerbation associated with androgen deprivation.  3.  Anemia: Related to malignancy and chronic renal sufficiency.  Hemoglobin remained stable.  No intervention is needed growth factor support may be required he developed worsening anemia  4. Followup: We will be in 4 months for repeat Lupron and evaluation.  15  minutes was spent with the patient face-to-face today.  More than 50% of time was spent on reviewing his disease status, treatment options, complications at therapy and answering questions regarding future plan of care.     Zola Button, MD 6/10/202010:25 AM

## 2019-02-08 ENCOUNTER — Telehealth: Payer: Self-pay | Admitting: Oncology

## 2019-02-08 NOTE — Telephone Encounter (Signed)
I talk with patient regarding schedule  

## 2019-04-30 ENCOUNTER — Other Ambulatory Visit (INDEPENDENT_AMBULATORY_CARE_PROVIDER_SITE_OTHER): Payer: Medicare Other

## 2019-04-30 ENCOUNTER — Other Ambulatory Visit: Payer: Self-pay

## 2019-04-30 DIAGNOSIS — E119 Type 2 diabetes mellitus without complications: Secondary | ICD-10-CM | POA: Diagnosis not present

## 2019-04-30 DIAGNOSIS — E7800 Pure hypercholesterolemia, unspecified: Secondary | ICD-10-CM

## 2019-04-30 LAB — COMPREHENSIVE METABOLIC PANEL
ALT: 12 U/L (ref 0–53)
AST: 16 U/L (ref 0–37)
Albumin: 4.1 g/dL (ref 3.5–5.2)
Alkaline Phosphatase: 50 U/L (ref 39–117)
BUN: 28 mg/dL — ABNORMAL HIGH (ref 6–23)
CO2: 28 mEq/L (ref 19–32)
Calcium: 9.7 mg/dL (ref 8.4–10.5)
Chloride: 104 mEq/L (ref 96–112)
Creatinine, Ser: 1.5 mg/dL (ref 0.40–1.50)
GFR: 45.71 mL/min — ABNORMAL LOW (ref >60.00)
Glucose, Bld: 105 mg/dL — ABNORMAL HIGH (ref 70–99)
Potassium: 4.5 mEq/L (ref 3.5–5.1)
Sodium: 139 mEq/L (ref 135–145)
Total Bilirubin: 0.4 mg/dL (ref 0.2–1.2)
Total Protein: 6.5 g/dL (ref 6.0–8.3)

## 2019-04-30 LAB — LIPID PANEL
Cholesterol: 157 mg/dL (ref 0–200)
HDL: 62.7 mg/dL (ref >39.00)
LDL Cholesterol: 60 mg/dL (ref 0–99)
NonHDL: 94.65
Total CHOL/HDL Ratio: 3
Triglycerides: 174 mg/dL — ABNORMAL HIGH (ref 0.0–149.0)
VLDL: 34.8 mg/dL (ref 0.0–40.0)

## 2019-04-30 LAB — HEMOGLOBIN A1C: Hgb A1c MFr Bld: 6 % (ref 4.6–6.5)

## 2019-05-03 ENCOUNTER — Other Ambulatory Visit: Payer: Self-pay

## 2019-05-03 ENCOUNTER — Encounter: Payer: Self-pay | Admitting: Endocrinology

## 2019-05-03 ENCOUNTER — Ambulatory Visit (INDEPENDENT_AMBULATORY_CARE_PROVIDER_SITE_OTHER): Payer: Medicare Other | Admitting: Endocrinology

## 2019-05-03 DIAGNOSIS — E119 Type 2 diabetes mellitus without complications: Secondary | ICD-10-CM

## 2019-05-03 DIAGNOSIS — E782 Mixed hyperlipidemia: Secondary | ICD-10-CM | POA: Diagnosis not present

## 2019-05-03 DIAGNOSIS — T50905A Adverse effect of unspecified drugs, medicaments and biological substances, initial encounter: Secondary | ICD-10-CM | POA: Diagnosis not present

## 2019-05-03 DIAGNOSIS — D519 Vitamin B12 deficiency anemia, unspecified: Secondary | ICD-10-CM

## 2019-05-03 NOTE — Progress Notes (Signed)
Patient ID: Richard [name], male   DOB: May 29, 1946, 73 y.o.   MRN: [id number]  Today's office visit was provided via telemedicine using a telephone call to the patient Patient has been explained the limitations of evaluation and management by telemedicine and the availability of in person appointments.  The patient understood the limitations and agreed to proceed. Patient also understood that the telehealth visit is billable. . Location of the patient: Home . Location of the provider: Office Only the patient and myself were participating in the encounter  Reason for Appointment:  Follow-up  History of Present Illness   Problem 1:  DIABETES MELITUS, date of diagnosis 1995 with a glucose of 320 and HemoglobinA1c of 9.6 Previous history: He has been on various oral hypoglycemic drugs in the past but for the last few years has required multiple drugs in combination; diabetes has been generally well controlled with A1c near normal  Recent history:   Oral hypoglycemic drugs: Amaryl 2 mg once a day in a.m., metformin 1000 twice a day, Actos 15 mg daily  His A1c is 6.0, was 6.4 on his last visit in 05/2017  Current management, blood sugar patterns and problems identified:  He reports checking blood sugars periodically but details are not available on the telephone call today  He says his blood sugars range from 92-170  Highest reading was after his dinner with a dessert  No hypoglycemic symptoms also  He is having some fluctuation of his weight but about the same as last visit at 213  As before he is trying to do some walking and is active with yard work  Lab glucose was 105 fasting  No edema with Actos  Side effects from medications: None       Monitors blood glucose:  irregularly     Glucometer: One Touch ultra 2 .          Home blood Glucose readings:   As above   Hypoglycemia:  none                 Physical activity: exercise:  Walking and yardwork    Dietician visit: Most recent:? 2007    Weight control:      Wt Readings from Last 3 Encounters:  02/07/19 212 lb 12.8 oz (96.5 kg)  10/05/18 219 lb 4.8 oz (99.5 kg)  07/31/18 213 lb 9.6 oz (96.9 kg)    Diabetes labs:  Lab Results  Component Value Date   HGBA1C 6.0 04/30/2019   HGBA1C 6.2 07/26/2018   HGBA1C 6.4 06/07/2017   Lab Results  Component Value Date   MICROALBUR 19.8 (H) 07/26/2018   LDLCALC 60 04/30/2019   CREATININE 1.50 04/30/2019    Other problems: See review of systems   Lab on 04/30/2019  Component Date Value Ref Range Status  . Cholesterol 04/30/2019 157  0 - 200 mg/dL Final   ATP III Classification       Desirable:  < 200 mg/dL               Borderline High:  200 - 239 mg/dL          High:  > = 240 mg/dL  . Triglycerides 04/30/2019 174.0* 0.0 - 149.0 mg/dL Final   Normal:  <150 mg/dLBorderline High:  150 - 199 mg/dL  . HDL 04/30/2019 62.70  >39.00 mg/dL Final  . VLDL 04/30/2019 34.8  0.0 - 40.0 mg/dL Final  . LDL Cholesterol 04/30/2019 60  0 - 99 mg/dL  Final  . Total CHOL/HDL Ratio 04/30/2019 3   Final                  Men          Women1/2 Average Risk     3.4          3.3Average Risk          5.0          4.42X Average Risk          9.6          7.13X Average Risk          15.0          11.0                      . NonHDL 04/30/2019 94.65   Final   NOTE:  Non-HDL goal should be 30 mg/dL higher than patient's LDL goal (i.e. LDL goal of < 70 mg/dL, would have non-HDL goal of < 100 mg/dL)  . Sodium 04/30/2019 139  135 - 145 mEq/L Final  . Potassium 04/30/2019 4.5  3.5 - 5.1 mEq/L Final  . Chloride 04/30/2019 104  96 - 112 mEq/L Final  . CO2 04/30/2019 28  19 - 32 mEq/L Final  . Glucose, Bld 04/30/2019 105* 70 - 99 mg/dL Final  . BUN 04/30/2019 28* 6 - 23 mg/dL Final  . Creatinine, Ser 04/30/2019 1.50  0.40 - 1.50 mg/dL Final  . Total Bilirubin 04/30/2019 0.4  0.2 - 1.2 mg/dL Final  . Alkaline Phosphatase 04/30/2019 50  39 - 117 U/L Final  . AST  04/30/2019 16  0 - 37 U/L Final  . ALT 04/30/2019 12  0 - 53 U/L Final  . Total Protein 04/30/2019 6.5  6.0 - 8.3 g/dL Final  . Albumin 04/30/2019 4.1  3.5 - 5.2 g/dL Final  . Calcium 04/30/2019 9.7  8.4 - 10.5 mg/dL Final  . GFR 04/30/2019 45.71* >60.00 mL/min Final  . Hgb A1c MFr Bld 04/30/2019 6.0  4.6 - 6.5 % Final   Glycemic Control Guidelines for People with Diabetes:Non Diabetic:  <6%Goal of Therapy: <7%Additional Action Suggested:  >8%      Allergies as of 05/03/2019      Reactions   Effexor Xr [venlafaxine Hcl] Rash      Medication List       Accurate as of May 03, 2019  8:06 AM. If you have any questions, ask your nurse or doctor.        amLODipine 5 MG tablet Commonly known as: NORVASC TAKE 1 TABLET(5 MG) BY MOUTH DAILY   atorvastatin 10 MG tablet Commonly known as: LIPITOR TAKE 1 TABLET(10 MG) BY MOUTH DAILY   glimepiride 2 MG tablet Commonly known as: AMARYL TAKE 1 TABLET (2 MG TOTAL) BY MOUTH DAILY WITH BREAKFAST.   glucose blood test strip Commonly known as: ONE TOUCH ULTRA TEST Use as instructed to check blood sugar 3 times per week dx code E11.9   LUPRON DEPOT (78-MONTH) IM Inject into the muscle.   metFORMIN 1000 MG tablet Commonly known as: GLUCOPHAGE TAKE 1 TABLET(1000 MG) BY MOUTH TWICE DAILY WITH A MEAL   ONE TOUCH ULTRA 2 w/Device Kit Use to check blood sugar 3 times per week dx code T62.5   OneTouch Delica Lancets Fine Misc Use to check blood sugar 3 times per week dx code E11.9   pioglitazone 30 MG tablet Commonly known as: ACTOS TAKE  1 TABLET(30 MG) BY MOUTH DAILY   ramipril 5 MG capsule Commonly known as: ALTACE TAKE 1 CAPSULE (10 MG TOTAL) BY MOUTH DAILY. What changed: additional instructions   sertraline 50 MG tablet Commonly known as: ZOLOFT Take 50 mg by mouth daily.   triamterene-hydrochlorothiazide 37.5-25 MG capsule Commonly known as: DYAZIDE Take 1 each (1 capsule total) by mouth daily.       Allergies:   Allergies  Allergen Reactions  . Effexor Xr [Venlafaxine Hcl] Rash    Past Medical History:  Diagnosis Date  . Diabetes mellitus without complication ([place])   . Hypertension   . prostate ca dx'd 2006   prostatectomy and xrt    No past surgical history on file.  Family History  Problem Relation Age of Onset  . Hypertension Father   . Heart disease Neg Hx   . Diabetes Neg Hx     Social History:  reports that he has been smoking. He has never used smokeless tobacco. No history on file for alcohol and drug.    REVIEW OF SYSTEMS:       Hypertension:  blood pressure has been  well controlled with 5 mg ramipril, amlodipine 5 mg and Dyazide He will occasionally monitor blood pressure at drugstore Last BP was apparently 115/65, no lightheadedness   BP Readings from Last 3 Encounters:  02/07/19 131/65  10/05/18 137/63  07/31/18 118/68   Has stable high normal creatinine  Lab Results  Component Value Date   CREATININE 1.50 04/30/2019   CREATININE 1.67 (H) 02/06/2019   CREATININE 1.50 (H) 10/03/2018     Lipids:  he has been treated with Lipitor 10 mg which he takes regularly LDL is controlled but triglycerides are again minimally higher than normal See results below   Lab Results  Component Value Date   CHOL 157 04/30/2019   HDL 62.70 04/30/2019   LDLCALC 60 04/30/2019   TRIG 174.0 (H) 04/30/2019   CHOLHDL 3 04/30/2019     History of vitamin B12 deficiency,he is taking this regularly Has mild chronic anemia  CBC followed by oncologist    He is still taking sertraline from his PCP for depression  Last eye exam was in 8/20   LABS:  Lab on 04/30/2019  Component Date Value Ref Range Status  . Cholesterol 04/30/2019 157  0 - 200 mg/dL Final   ATP III Classification       Desirable:  < 200 mg/dL               Borderline High:  200 - 239 mg/dL          High:  > = 240 mg/dL  . Triglycerides 04/30/2019 174.0* 0.0 - 149.0 mg/dL Final   Normal:  <150  mg/dLBorderline High:  150 - 199 mg/dL  . HDL 04/30/2019 62.70  >39.00 mg/dL Final  . VLDL 04/30/2019 34.8  0.0 - 40.0 mg/dL Final  . LDL Cholesterol 04/30/2019 60  0 - 99 mg/dL Final  . Total CHOL/HDL Ratio 04/30/2019 3   Final                  Men          Women1/2 Average Risk     3.4          3.3Average Risk          5.0          4.42X Average Risk          9.6  7.13X Average Risk          15.0          11.0                      . NonHDL 04/30/2019 94.65   Final   NOTE:  Non-HDL goal should be 30 mg/dL higher than patient's LDL goal (i.e. LDL goal of < 70 mg/dL, would have non-HDL goal of < 100 mg/dL)  . Sodium 04/30/2019 139  135 - 145 mEq/L Final  . Potassium 04/30/2019 4.5  3.5 - 5.1 mEq/L Final  . Chloride 04/30/2019 104  96 - 112 mEq/L Final  . CO2 04/30/2019 28  19 - 32 mEq/L Final  . Glucose, Bld 04/30/2019 105* 70 - 99 mg/dL Final  . BUN 04/30/2019 28* 6 - 23 mg/dL Final  . Creatinine, Ser 04/30/2019 1.50  0.40 - 1.50 mg/dL Final  . Total Bilirubin 04/30/2019 0.4  0.2 - 1.2 mg/dL Final  . Alkaline Phosphatase 04/30/2019 50  39 - 117 U/L Final  . AST 04/30/2019 16  0 - 37 U/L Final  . ALT 04/30/2019 12  0 - 53 U/L Final  . Total Protein 04/30/2019 6.5  6.0 - 8.3 g/dL Final  . Albumin 04/30/2019 4.1  3.5 - 5.2 g/dL Final  . Calcium 04/30/2019 9.7  8.4 - 10.5 mg/dL Final  . GFR 04/30/2019 45.71* >60.00 mL/min Final  . Hgb A1c MFr Bld 04/30/2019 6.0  4.6 - 6.5 % Final   Glycemic Control Guidelines for People with Diabetes:Non Diabetic:  <6%Goal of Therapy: <7%Additional Action Suggested:  >8%      Examination:   There were no vitals taken for this visit.  There is no height or weight on file to calculate BMI.     ASSESSMENT/ PLAN:    Diabetes type 2 with mild obesity:   See history of present illness for detailed discussion of current diabetes management, blood sugar patterns and problems identified   His A1c is consistently in the upper normal range and now 6%   He has done a few blood sugars at home and they are mostly normal No hypoglycemia reported He is able to maintain his weight and has fair control of diet Usually active especially in summer  Will not change his regimen of metformin, Amaryl 2 mg and Actos  Hypertension: Blood pressure is generally controlled with his 3 drug regimen but will need to confirm in the office, he is also going to see his PCP soon  LIPIDS: LDL well controlled, has mild stable increase in triglycerides without low HDL  B12 deficiency: Followed by his other physicians  Follow-up in 62-month Duration of telephone encounter =7 minutes   AElayne Snare9/10/2018, 8:06 AM     Note: This office note was prepared with Dragon voice recognition system technology. Any transcriptional errors that result from this process are unintentional.

## 2019-05-29 ENCOUNTER — Inpatient Hospital Stay: Payer: Medicare Other | Attending: Oncology

## 2019-05-29 ENCOUNTER — Other Ambulatory Visit: Payer: Self-pay

## 2019-05-29 DIAGNOSIS — C61 Malignant neoplasm of prostate: Secondary | ICD-10-CM | POA: Diagnosis not present

## 2019-05-29 LAB — CMP (CANCER CENTER ONLY)
ALT: 12 U/L (ref 0–44)
AST: 17 U/L (ref 15–41)
Albumin: 4.1 g/dL (ref 3.5–5.0)
Alkaline Phosphatase: 56 U/L (ref 38–126)
Anion gap: 9 (ref 5–15)
BUN: 28 mg/dL — ABNORMAL HIGH (ref 8–23)
CO2: 26 mmol/L (ref 22–32)
Calcium: 9.1 mg/dL (ref 8.9–10.3)
Chloride: 103 mmol/L (ref 98–111)
Creatinine: 1.48 mg/dL — ABNORMAL HIGH (ref 0.61–1.24)
GFR, Est AFR Am: 53 mL/min — ABNORMAL LOW (ref >60)
GFR, Estimated: 46 mL/min — ABNORMAL LOW (ref >60)
Glucose, Bld: 84 mg/dL (ref 70–99)
Potassium: 4.8 mmol/L (ref 3.5–5.1)
Sodium: 138 mmol/L (ref 135–145)
Total Bilirubin: 0.4 mg/dL (ref 0.3–1.2)
Total Protein: 6.5 g/dL (ref 6.5–8.1)

## 2019-05-29 LAB — CBC WITH DIFFERENTIAL (CANCER CENTER ONLY)
Abs Immature Granulocytes: 0.01 10*3/uL (ref 0.00–0.07)
Basophils Absolute: 0 10*3/uL (ref 0.0–0.1)
Basophils Relative: 1 %
Eosinophils Absolute: 0.1 10*3/uL (ref 0.0–0.5)
Eosinophils Relative: 2 %
HCT: 34 % — ABNORMAL LOW (ref 39.0–52.0)
Hemoglobin: 11.1 g/dL — ABNORMAL LOW (ref 13.0–17.0)
Immature Granulocytes: 0 %
Lymphocytes Relative: 28 %
Lymphs Abs: 1.8 10*3/uL (ref 0.7–4.0)
MCH: 32.2 pg (ref 26.0–34.0)
MCHC: 32.6 g/dL (ref 30.0–36.0)
MCV: 98.6 fL (ref 80.0–100.0)
Monocytes Absolute: 0.8 10*3/uL (ref 0.1–1.0)
Monocytes Relative: 12 %
Neutro Abs: 3.7 10*3/uL (ref 1.7–7.7)
Neutrophils Relative %: 57 %
Platelet Count: 214 10*3/uL (ref 150–400)
RBC: 3.45 MIL/uL — ABNORMAL LOW (ref 4.22–5.81)
RDW: 12.1 % (ref 11.5–15.5)
WBC Count: 6.4 10*3/uL (ref 4.0–10.5)
nRBC: 0 % (ref 0.0–0.2)

## 2019-05-30 LAB — PROSTATE-SPECIFIC AG, SERUM (LABCORP): Prostate Specific Ag, Serum: <0.1 ng/mL (ref 0.0–4.0)

## 2019-05-31 ENCOUNTER — Inpatient Hospital Stay: Payer: Medicare Other | Attending: Oncology | Admitting: Oncology

## 2019-05-31 ENCOUNTER — Other Ambulatory Visit: Payer: Self-pay

## 2019-05-31 ENCOUNTER — Inpatient Hospital Stay: Payer: Medicare Other

## 2019-05-31 VITALS — BP 123/60 | HR 66 | Temp 98.3°F | Resp 17 | Ht 74.0 in | Wt 215.1 lb

## 2019-05-31 DIAGNOSIS — N289 Disorder of kidney and ureter, unspecified: Secondary | ICD-10-CM | POA: Diagnosis not present

## 2019-05-31 DIAGNOSIS — D638 Anemia in other chronic diseases classified elsewhere: Secondary | ICD-10-CM | POA: Insufficient documentation

## 2019-05-31 DIAGNOSIS — E119 Type 2 diabetes mellitus without complications: Secondary | ICD-10-CM | POA: Diagnosis not present

## 2019-05-31 DIAGNOSIS — Z8546 Personal history of malignant neoplasm of prostate: Secondary | ICD-10-CM

## 2019-05-31 DIAGNOSIS — C61 Malignant neoplasm of prostate: Secondary | ICD-10-CM

## 2019-05-31 DIAGNOSIS — Z5111 Encounter for antineoplastic chemotherapy: Secondary | ICD-10-CM | POA: Insufficient documentation

## 2019-05-31 MED ORDER — LEUPROLIDE ACETATE (4 MONTH) 30 MG ~~LOC~~ KIT
30.0000 mg | PACK | Freq: Once | SUBCUTANEOUS | Status: AC
  Administered 2019-05-31: 09:00:00 30 mg via SUBCUTANEOUS
  Filled 2019-05-31: qty 30

## 2019-05-31 NOTE — Progress Notes (Signed)
Hematology and Oncology Follow Up Visit  Richard [name] [id number] [birth year] 73 y.o. 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   He was initially diagnosed in 2006 with Gleason score 4+3 = 7 with a PSA of 3.66 with localized disease.  Prior Therapy: 1. He is status post retropubic prostatectomy and bilateral lymph node dissection done on 11/25/2004. The pathology showed a Gleason score of 4+4 equals 8 with staging of T2b N0.  2. Sstatus post salvage radiation therapy done between March and May of 2007 for a rise in his PSA to 0.29. He received total of 65 gray in 35 fractions. 3.  He was treated with intermittent Lupron until 2016.  Lupron has been given at that time.  Current therapy:   Lupron 30 mg every 4 months started in April 2016.  Interim History: [name] is here for a follow-up.  Since her last visit, he reports no major changes in his health.  He has tolerated Lupron without any new complications.  He denies any nausea or excessive fatigue.  He does report hot flashes which is manageable.  He denies any chest pain or difficulty breathing.  Denies any pathological fractures or hospitalizations.  He denies any urinary symptoms.  He denied any alteration mental status, neuropathy, confusion or dizziness.  Denies any headaches or lethargy.  Denies any night sweats, weight loss or changes in appetite.  Denied orthopnea, dyspnea on exertion or chest discomfort.  Denies shortness of breath, difficulty breathing hemoptysis or cough.  Denies any abdominal distention, nausea, early satiety or dyspepsia.  Denies any hematuria, frequency, dysuria or nocturia.  Denies any skin irritation, dryness or rash.  Denies any ecchymosis or petechiae.  Denies any lymphadenopathy or clotting.  Denies any heat or cold intolerance.  Denies any anxiety or depression.  Remaining review of system is  negative.            Medications: Reviewed and updated today.  Current Outpatient Medications on File Prior to Visit  Medication Sig Dispense Refill  . amLODipine (NORVASC) 5 MG tablet TAKE 1 TABLET(5 MG) BY MOUTH DAILY 90 tablet 0  . atorvastatin (LIPITOR) 10 MG tablet TAKE 1 TABLET(10 MG) BY MOUTH DAILY 90 tablet 0  . Blood Glucose Monitoring Suppl (ONE TOUCH ULTRA 2) W/DEVICE KIT Use to check blood sugar 3 times per week dx code E11.9 (Patient taking differently: Use to check blood sugar 3 times per week dx code E11.9) 1 each 0  . glimepiride (AMARYL) 2 MG tablet TAKE 1 TABLET (2 MG TOTAL) BY MOUTH DAILY WITH BREAKFAST. 90 tablet 0  . glucose blood (ONE TOUCH ULTRA TEST) test strip Use as instructed to check blood sugar 3 times per week dx code E11.9 100 each 1  . Leuprolide Acetate (LUPRON DEPOT, 58-MONTH, IM) Inject into the muscle.    . metFORMIN (GLUCOPHAGE) 1000 MG tablet TAKE 1 TABLET(1000 MG) BY MOUTH TWICE DAILY WITH A MEAL 180 tablet 0  . ONETOUCH DELICA LANCETS FINE MISC Use to check blood sugar 3 times per week dx code E11.9 100 each 3  . pioglitazone (ACTOS) 30 MG tablet TAKE 1 TABLET(30 MG) BY MOUTH DAILY 90 tablet 0  . ramipril (ALTACE) 5 MG capsule TAKE 1 CAPSULE (10 MG TOTAL) BY MOUTH DAILY. (Patient taking differently: TAKE 1 CAPSULE (10 MG TOTAL) BY MOUTH TWICE DAILY.) 90 capsule 2  . sertraline (ZOLOFT) 50 MG tablet Take 50  mg by mouth daily.    Marland Kitchen triamterene-hydrochlorothiazide (DYAZIDE) 37.5-25 MG capsule Take 1 each (1 capsule total) by mouth daily. 90 capsule 3   No current facility-administered medications on file prior to visit.      Allergies:  Allergies  Allergen Reactions  . Effexor Xr [Venlafaxine Hcl] Rash    Past Medical History, Surgical history, Social history, and Family History without any changes on review.  . Physical Exam:  Blood pressure 123/60, pulse 66, temperature 98.3 F (36.8 C), temperature source Oral, resp. rate 17, height  _0  (1.88 m), weight 215 lb 1.6 oz (97.6 kg), SpO2 100 %.     ECOG: 0     General appearance: Alert, awake without any distress. Head: Atraumatic without abnormalities Oropharynx: Without any thrush or ulcers. Eyes: No scleral icterus. Lymph nodes: No lymphadenopathy noted in the cervical, supraclavicular, or axillary nodes Heart:regular rate and rhythm, without any murmurs or gallops.   Lung: Clear to auscultation without any rhonchi, wheezes or dullness to percussion. Abdomin: Soft, nontender without any shifting dullness or ascites. Musculoskeletal: No clubbing or cyanosis. Neurological: No motor or sensory deficits. Skin: No rashes or lesions. Psychiatric: Mood and affect appeared normal.      Lab Results: Lab Results  Component Value Date   WBC 6.4 05/29/2019   HGB 11.1 (L) 05/29/2019   HCT 34.0 (L) 05/29/2019   MCV 98.6 05/29/2019   PLT 214 05/29/2019     Chemistry      Component Value Date/Time   NA 138 05/29/2019 1049   NA 140 05/24/2017 0810   K 4.8 05/29/2019 1049   K 4.4 05/24/2017 0810   CL 103 05/29/2019 1049   CL 102 02/20/2013 1254   CO2 26 05/29/2019 1049   CO2 25 05/24/2017 0810   BUN 28 (H) 05/29/2019 1049   BUN 24.7 05/24/2017 0810   CREATININE 1.48 (H) 05/29/2019 1049   CREATININE 1.2 05/24/2017 0810      Component Value Date/Time   CALCIUM 9.1 05/29/2019 1049   CALCIUM 9.3 05/24/2017 0810   ALKPHOS 56 05/29/2019 1049   ALKPHOS 60 05/24/2017 0810   AST 17 05/29/2019 1049   AST 14 05/24/2017 0810   ALT 12 05/29/2019 1049   ALT 9 05/24/2017 0810   BILITOT 0.4 05/29/2019 1049   BILITOT 0.41 05/24/2017 0810       Results for VARDAAN, DEPASCALE "MIKE" (MRN [id number]) as of 05/31/2019 08:00  Ref. Range 02/06/2019 08:09 05/29/2019 10:49  Prostate Specific Ag, Serum Latest Ref Range: 0.0 - 4.0 ng/mL <0.1 <0.1       Impression and Plan:  74 year old man with:   1.  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    He is currently on androgen deprivation therapy alone with a PSA that is undetectable.  The natural course of this disease as well as risks and benefits of the current therapy was discussed.  Long-term complications occluding osteoporosis, weight gain and hot flashes were reviewed.  Therapy escalation with androgen synthesis inhibitors and androgen receptor blockade was discussed today and deferred for the time being unless he develops measurable disease.  He understands he will likely develop castration resistant disease and measurable disease will be detected in the future.  Treatment options at that time will be adding medication such as Zytiga, Xtandi possibly systemic chemotherapy.  He is agreeable to proceed and will receive Eligard today and repeated in 4 months.  2. Diabetes mellitus: His  blood sugar continues to be under control on current androgen deprivation.  3.  Anemia: Multifactorial in nature related to renal insufficiency is well as chronic disease.  Hemoglobin stable and does not require any intervention.  4. Followup: In 4 months for repeat evaluation and injection.  25  minutes was spent with the patient face-to-face today.  More than 50% of time was dedicated to reviewing the natural course of his disease, risk of progression, treatment options and future complications related therapy.    Zola Button, MD 10/1/20208:04 AM

## 2019-05-31 NOTE — Patient Instructions (Signed)
Leuprolide injection What is this medicine? LEUPROLIDE (loo PROE lide) is a man-made hormone. It is used to treat the symptoms of prostate cancer. This medicine may also be used to treat children with early onset of puberty. It may be used for other hormonal conditions. This medicine may be used for other purposes; ask your health care provider or pharmacist if you have questions. COMMON BRAND NAME(S): Lupron What should I tell my health care provider before I take this medicine? They need to know if you have any of these conditions:  diabetes  heart disease or previous heart attack  high blood pressure  high cholesterol  pain or difficulty passing urine  spinal cord metastasis  stroke  tobacco smoker  an unusual or allergic reaction to leuprolide, benzyl alcohol, other medicines, foods, dyes, or preservatives  pregnant or trying to get pregnant  breast-feeding How should I use this medicine? This medicine is for injection under the skin or into a muscle. You will be taught how to prepare and give this medicine. Use exactly as directed. Take your medicine at regular intervals. Do not take your medicine more often than directed. It is important that you put your used needles and syringes in a special sharps container. Do not put them in a trash can. If you do not have a sharps container, call your pharmacist or healthcare provider to get one. A special MedGuide will be given to you by the pharmacist with each prescription and refill. Be sure to read this information carefully each time. Talk to your pediatrician regarding the use of this medicine in children. While this medicine may be prescribed for children as young as 8 years for selected conditions, precautions do apply. Overdosage: If you think you have taken too much of this medicine contact a poison control center or emergency room at once. NOTE: This medicine is only for you. Do not share this medicine with others. What if  I miss a dose? If you miss a dose, take it as soon as you can. If it is almost time for your next dose, take only that dose. Do not take double or extra doses. What may interact with this medicine? Do not take this medicine with any of the following medications:  chasteberry This medicine may also interact with the following medications:  herbal or dietary supplements, like black cohosh or DHEA  male hormones, like estrogens or progestins and birth control pills, patches, rings, or injections  male hormones, like testosterone This list may not describe all possible interactions. Give your health care provider a list of all the medicines, herbs, non-prescription drugs, or dietary supplements you use. Also tell them if you smoke, drink alcohol, or use illegal drugs. Some items may interact with your medicine. What should I watch for while using this medicine? Visit your doctor or health care professional for regular checks on your progress. During the first week, your symptoms may get worse, but then will improve as you continue your treatment. You may get hot flashes, increased bone pain, increased difficulty passing urine, or an aggravation of nerve symptoms. Discuss these effects with your doctor or health care professional, some of them may improve with continued use of this medicine. Male patients may experience a menstrual cycle or spotting during the first 2 months of therapy with this medicine. If this continues, contact your doctor or health care professional. This medicine may increase blood sugar. Ask your healthcare provider if changes in diet or medicines are needed if   you have diabetes. What side effects may I notice from receiving this medicine? Side effects that you should report to your doctor or health care professional as soon as possible:  allergic reactions like skin rash, itching or hives, swelling of the face, lips, or tongue  breathing problems  chest  pain  depression or memory disorders  pain in your legs or groin  pain at site where injected  severe headache  signs and symptoms of high blood sugar such as being more thirsty or hungry or having to urinate more than normal. You may also feel very tired or have blurry vision  swelling of the feet and legs  visual changes  vomiting Side effects that usually do not require medical attention (report to your doctor or health care professional if they continue or are bothersome):  breast swelling or tenderness  decrease in sex drive or performance  diarrhea  hot flashes  loss of appetite  muscle, joint, or bone pains  nausea  redness or irritation at site where injected  skin problems or acne This list may not describe all possible side effects. Call your doctor for medical advice about side effects. You may report side effects to FDA at 1-800-FDA-1088. Where should I keep my medicine? Keep out of the reach of children. Store below 25 degrees C (77 degrees F). Do not freeze. Protect from light. Do not use if it is not clear or if there are particles present. Throw away any unused medicine after the expiration date. NOTE: This sheet is a summary. It may not cover all possible information. If you have questions about this medicine, talk to your doctor, pharmacist, or health care provider.  2020 Elsevier/Gold Standard (2018-06-15 09:52:48)  

## 2019-06-05 ENCOUNTER — Telehealth: Payer: Self-pay | Admitting: Oncology

## 2019-06-05 NOTE — Telephone Encounter (Signed)
Scheduled per sch msg. Called and spoke with wife. Confirmed appt

## 2019-09-20 ENCOUNTER — Ambulatory Visit: Payer: Medicare PPO | Attending: Internal Medicine

## 2019-09-20 DIAGNOSIS — Z23 Encounter for immunization: Secondary | ICD-10-CM

## 2019-09-20 NOTE — Progress Notes (Signed)
   Covid-19 Vaccination Clinic  Name:  Richard [name]    MRN: [id number] DOB: 05-29-1946  09/20/2019  [name] was observed post Covid-19 immunization for 15 minutes without incidence. He was provided with Vaccine Information Sheet and instruction to access the V-Safe system.   [name] was instructed to call 911 with any severe reactions post vaccine: Marland Kitchen Difficulty breathing  . Swelling of your face and throat  . A fast heartbeat  . A bad rash all over your body  . Dizziness and weakness    Immunizations Administered    Name Date Dose VIS Date Route   Pfizer COVID-19 Vaccine 09/20/2019 12:39 PM 0.3 mL 08/10/2019 Intramuscular   Manufacturer: [place]   Lot: [id number]   [place]: [id number]

## 2019-10-02 ENCOUNTER — Other Ambulatory Visit: Payer: Self-pay

## 2019-10-02 ENCOUNTER — Inpatient Hospital Stay: Payer: Medicare PPO | Attending: Oncology

## 2019-10-02 DIAGNOSIS — D649 Anemia, unspecified: Secondary | ICD-10-CM | POA: Insufficient documentation

## 2019-10-02 DIAGNOSIS — Z5111 Encounter for antineoplastic chemotherapy: Secondary | ICD-10-CM | POA: Diagnosis present

## 2019-10-02 DIAGNOSIS — E119 Type 2 diabetes mellitus without complications: Secondary | ICD-10-CM | POA: Diagnosis not present

## 2019-10-02 DIAGNOSIS — C61 Malignant neoplasm of prostate: Secondary | ICD-10-CM | POA: Diagnosis present

## 2019-10-02 LAB — CMP (CANCER CENTER ONLY)
ALT: 13 U/L (ref 0–44)
AST: 16 U/L (ref 15–41)
Albumin: 4 g/dL (ref 3.5–5.0)
Alkaline Phosphatase: 57 U/L (ref 38–126)
Anion gap: 8 (ref 5–15)
BUN: 28 mg/dL — ABNORMAL HIGH (ref 8–23)
CO2: 26 mmol/L (ref 22–32)
Calcium: 9.1 mg/dL (ref 8.9–10.3)
Chloride: 106 mmol/L (ref 98–111)
Creatinine: 1.35 mg/dL — ABNORMAL HIGH (ref 0.61–1.24)
GFR, Est AFR Am: 60 mL/min — ABNORMAL LOW (ref >60)
GFR, Estimated: 51 mL/min — ABNORMAL LOW (ref >60)
Glucose, Bld: 78 mg/dL (ref 70–99)
Potassium: 5.2 mmol/L — ABNORMAL HIGH (ref 3.5–5.1)
Sodium: 140 mmol/L (ref 135–145)
Total Bilirubin: 0.3 mg/dL (ref 0.3–1.2)
Total Protein: 6.5 g/dL (ref 6.5–8.1)

## 2019-10-02 LAB — CBC WITH DIFFERENTIAL (CANCER CENTER ONLY)
Abs Immature Granulocytes: 0.02 10*3/uL (ref 0.00–0.07)
Basophils Absolute: 0.1 10*3/uL (ref 0.0–0.1)
Basophils Relative: 1 %
Eosinophils Absolute: 0.1 10*3/uL (ref 0.0–0.5)
Eosinophils Relative: 2 %
HCT: 37 % — ABNORMAL LOW (ref 39.0–52.0)
Hemoglobin: 11.7 g/dL — ABNORMAL LOW (ref 13.0–17.0)
Immature Granulocytes: 0 %
Lymphocytes Relative: 29 %
Lymphs Abs: 1.9 10*3/uL (ref 0.7–4.0)
MCH: 31.5 pg (ref 26.0–34.0)
MCHC: 31.6 g/dL (ref 30.0–36.0)
MCV: 99.7 fL (ref 80.0–100.0)
Monocytes Absolute: 0.7 10*3/uL (ref 0.1–1.0)
Monocytes Relative: 10 %
Neutro Abs: 3.8 10*3/uL (ref 1.7–7.7)
Neutrophils Relative %: 58 %
Platelet Count: 222 10*3/uL (ref 150–400)
RBC: 3.71 MIL/uL — ABNORMAL LOW (ref 4.22–5.81)
RDW: 12.4 % (ref 11.5–15.5)
WBC Count: 6.6 10*3/uL (ref 4.0–10.5)
nRBC: 0 % (ref 0.0–0.2)

## 2019-10-03 LAB — PROSTATE-SPECIFIC AG, SERUM (LABCORP): Prostate Specific Ag, Serum: <0.1 ng/mL (ref 0.0–4.0)

## 2019-10-04 ENCOUNTER — Other Ambulatory Visit: Payer: Self-pay

## 2019-10-04 ENCOUNTER — Inpatient Hospital Stay: Payer: Medicare PPO

## 2019-10-04 ENCOUNTER — Inpatient Hospital Stay (HOSPITAL_BASED_OUTPATIENT_CLINIC_OR_DEPARTMENT_OTHER): Payer: Medicare PPO | Admitting: Oncology

## 2019-10-04 ENCOUNTER — Telehealth: Payer: Self-pay | Admitting: Oncology

## 2019-10-04 VITALS — BP 137/77 | HR 64 | Temp 98.8°F | Resp 18 | Ht 74.0 in | Wt 218.5 lb

## 2019-10-04 DIAGNOSIS — C61 Malignant neoplasm of prostate: Secondary | ICD-10-CM | POA: Diagnosis not present

## 2019-10-04 DIAGNOSIS — Z8546 Personal history of malignant neoplasm of prostate: Secondary | ICD-10-CM | POA: Diagnosis not present

## 2019-10-04 DIAGNOSIS — Z5111 Encounter for antineoplastic chemotherapy: Secondary | ICD-10-CM | POA: Diagnosis not present

## 2019-10-04 MED ORDER — LEUPROLIDE ACETATE (4 MONTH) 30 MG ~~LOC~~ KIT
30.0000 mg | PACK | Freq: Once | SUBCUTANEOUS | Status: AC
  Administered 2019-10-04: 30 mg via SUBCUTANEOUS
  Filled 2019-10-04: qty 30

## 2019-10-04 NOTE — Telephone Encounter (Signed)
Scheduled appt per 2/4 los.  Sent a a message to HIM pool to get a calendar mailed out.

## 2019-10-04 NOTE — Progress Notes (Signed)
Hematology and Oncology Follow Up Visit  Richard [name] [id number] 18-May-1946 74 y.o. 10/04/2019 1:17 PM    Principle Diagnosis: 74 year old man with prostate cancer diagnosed in 2006 with Gleason score 4+3 = 7 with a PSA of 3.66 .  He has castration-sensitive disease with biochemical relapse noted in 2016.     Prior Therapy: 1. He is status post retropubic prostatectomy and bilateral lymph node dissection done on 11/25/2004. The pathology showed a Gleason score of 4+4 equals 8 with staging of T2b N0.  2. Sstatus post salvage radiation therapy done between March and May of 2007 for a rise in his PSA to 0.29. He received total of 65 gray in 35 fractions. 3.  He was treated with intermittent Lupron until 2016.  Lupron has been given at that time.  Current therapy:   Lupron 30 mg every 4 months started in April 2016.  He received Eligard on October 1 of 2020.  Interim History: Richard [name] returns today for a repeat evaluation.  Since the last visit, he reports no major changes in his health.  He continues to tolerate androgen deprivation therapy.  He denies any hot flashes, weight gain or hypoglycemia.  He remains active and continues to attend activities of daily living.  Denies any bone pain or pathological fractures.            Medications: Updated today.  Current Outpatient Medications on File Prior to Visit  Medication Sig Dispense Refill  . amLODipine (NORVASC) 5 MG tablet TAKE 1 TABLET(5 MG) BY MOUTH DAILY 90 tablet 0  . atorvastatin (LIPITOR) 10 MG tablet TAKE 1 TABLET(10 MG) BY MOUTH DAILY 90 tablet 0  . Blood Glucose Monitoring Suppl (ONE TOUCH ULTRA 2) W/DEVICE KIT Use to check blood sugar 3 times per week dx code E11.9 (Patient taking differently: Use to check blood sugar 3 times per week dx code E11.9) 1 each 0  . glimepiride (AMARYL) 2 MG tablet TAKE 1 TABLET (2 MG TOTAL) BY MOUTH DAILY WITH BREAKFAST. 90 tablet 0  . glucose blood (ONE TOUCH ULTRA TEST) test strip Use  as instructed to check blood sugar 3 times per week dx code E11.9 100 each 1  . Leuprolide Acetate (LUPRON DEPOT, 31-MONTH, IM) Inject into the muscle.    . metFORMIN (GLUCOPHAGE) 1000 MG tablet TAKE 1 TABLET(1000 MG) BY MOUTH TWICE DAILY WITH A MEAL 180 tablet 0  . ONETOUCH DELICA LANCETS FINE MISC Use to check blood sugar 3 times per week dx code E11.9 100 each 3  . pioglitazone (ACTOS) 30 MG tablet TAKE 1 TABLET(30 MG) BY MOUTH DAILY 90 tablet 0  . ramipril (ALTACE) 5 MG capsule TAKE 1 CAPSULE (10 MG TOTAL) BY MOUTH DAILY. (Patient taking differently: TAKE 1 CAPSULE (10 MG TOTAL) BY MOUTH TWICE DAILY.) 90 capsule 2  . sertraline (ZOLOFT) 50 MG tablet Take 50 mg by mouth daily.    Marland Kitchen triamterene-hydrochlorothiazide (DYAZIDE) 37.5-25 MG capsule Take 1 each (1 capsule total) by mouth daily. 90 capsule 3   No current facility-administered medications on file prior to visit.     Allergies:  Allergies  Allergen Reactions  . Effexor Xr [Venlafaxine Hcl] Rash      . Physical Exam:   Blood pressure 137/77, pulse 64, temperature 98.8 F (37.1 C), temperature source Temporal, resp. rate 18, height '6\' 2"'  (1.88 m), weight 218 lb 8 oz (99.1 kg), SpO2 100 %.     ECOG: 0   General appearance: Comfortable appearing without any  discomfort Head: Normocephalic without any trauma Oropharynx: Mucous membranes are moist and pink without any thrush or ulcers. Eyes: Pupils are equal and round reactive to light. Lymph nodes: No cervical, supraclavicular, inguinal or axillary lymphadenopathy.   Heart:regular rate and rhythm.  S1 and S2 without leg edema. Lung: Clear without any rhonchi or wheezes.  No dullness to percussion. Abdomin: Soft, nontender, nondistended with good bowel sounds.  No hepatosplenomegaly. Musculoskeletal: No joint deformity or effusion.  Full range of motion noted. Neurological: No deficits noted on motor, sensory and deep tendon reflex exam. Skin: No petechial rash or  dryness.  Appeared moist.        Lab Results: Lab Results  Component Value Date   WBC 6.6 10/02/2019   HGB 11.7 (L) 10/02/2019   HCT 37.0 (L) 10/02/2019   MCV 99.7 10/02/2019   PLT 222 10/02/2019     Chemistry      Component Value Date/Time   NA 140 10/02/2019 1245   NA 140 05/24/2017 0810   K 5.2 (H) 10/02/2019 1245   K 4.4 05/24/2017 0810   CL 106 10/02/2019 1245   CL 102 02/20/2013 1254   CO2 26 10/02/2019 1245   CO2 25 05/24/2017 0810   BUN 28 (H) 10/02/2019 1245   BUN 24.7 05/24/2017 0810   CREATININE 1.35 (H) 10/02/2019 1245   CREATININE 1.2 05/24/2017 0810      Component Value Date/Time   CALCIUM 9.1 10/02/2019 1245   CALCIUM 9.3 05/24/2017 0810   ALKPHOS 57 10/02/2019 1245   ALKPHOS 60 05/24/2017 0810   AST 16 10/02/2019 1245   AST 14 05/24/2017 0810   ALT 13 10/02/2019 1245   ALT 9 05/24/2017 0810   BILITOT 0.3 10/02/2019 1245   BILITOT 0.41 05/24/2017 0810       Results for [name], [name] (MRN [id number]) as of 10/04/2019 13:19  Ref. Range 10/02/2019 12:45  Prostate Specific Ag, Serum Latest Ref Range: 0.0 - 4.0 ng/mL <0.1        Impression and Plan:  74 year old man with:   1.  Prostate cancer diagnosed in 2006 and subsequently developed castration-sensitive disease without any measurable disease and biochemical relapse in 2016.    Today continues to have excellent PSA response at this time which remains undetectable.  Risks and benefits of continuing androgen deprivation long-term was reviewed.  These issues include osteoporosis, sexual dysfunction as well as cardiovascular issues were reviewed.  He is agreeable to continue at this time.  Therapy escalation with [name] or systemic chemotherapy were reviewed and deferred unless he developed castration-resistant disease.  2.  Androgen deprivation therapy: He will receive Eligard today and repeated in 4 months.  The duration of therapy is indefinite at this time.  Intermittent  androgen deprivation was discussed but considered to be less optimal at this time.   3. Diabetes mellitus: No recent exacerbation of his blood sugar.  4.  Anemia: Remains a mild and improving at this time.  His hemoglobin is 11.7 and asymptomatic.  This appears to be multifactorial related to chronic disease as well as renal insufficiency.  5. Followup: He will return in 4 months for repeat evaluation and Eligard injection.  30 minutes was dedicated to this encounter.  The time was spent on reviewing laboratory data, disease status update, treatment options and coordinating plan of care.    Zola Button, MD 2/4/20211:17 PM

## 2019-10-04 NOTE — Patient Instructions (Signed)
Leuprolide depot injection or implant What is this medicine? LEUPROLIDE (loo PROE lide) is a man-made protein that acts like a natural hormone in the body. It decreases testosterone in men and decreases estrogen in women. In men, this medicine is used to treat advanced prostate cancer. In women, some forms of this medicine may be used to treat endometriosis, uterine fibroids, or other male hormone-related problems. This medicine may be used for other purposes; ask your health care provider or pharmacist if you have questions. COMMON BRAND NAME(S): Eligard, Lupron Depot, Lupron Depot-Ped, Viadur What should I tell my health care provider before I take this medicine? They need to know if you have any of these conditions: -diabetes -heart disease or previous heart attack -high blood pressure -high cholesterol -osteoporosis -pain or difficulty passing urine -spinal cord metastasis -stroke -tobacco smoker -unusual vaginal bleeding (women) -an unusual or allergic reaction to leuprolide, benzyl alcohol, other medicines, foods, dyes, or preservatives -pregnant or trying to get pregnant -breast-feeding How should I use this medicine? This medicine is for injection into a muscle or for implant or injection under the skin. It is given by a health care professional in a hospital or clinic setting. The specific product will determine how it will be given to you. Make sure you understand which product you receive and how often you will receive it. Talk to your pediatrician regarding the use of this medicine in children. Special care may be needed. Overdosage: If you think you have taken too much of this medicine contact a poison control center or emergency room at once. NOTE: This medicine is only for you. Do not share this medicine with others. What if I miss a dose? It is important not to miss a dose. Call your doctor or health care professional if you are unable to keep an appointment. Depot  injections: Depot injections are given either once-monthly, every 12 weeks, every 16 weeks, or every 24 weeks depending on the product you are prescribed. The product you are prescribed will be based on if you are male or male, and your condition. Make sure you understand your product and dosing. Implant dosing: The implant is removed and replaced once a year. The implant is only used in males. What may interact with this medicine? Do not take this medicine with any of the following medications: -chasteberry This medicine may also interact with the following medications: -herbal or dietary supplements, like black cohosh or DHEA -male hormones, like estrogens or progestins and birth control pills, patches, rings, or injections -male hormones, like testosterone This list may not describe all possible interactions. Give your health care provider a list of all the medicines, herbs, non-prescription drugs, or dietary supplements you use. Also tell them if you smoke, drink alcohol, or use illegal drugs. Some items may interact with your medicine. What should I watch for while using this medicine? Visit your doctor or health care professional for regular checks on your progress. During the first weeks of treatment, your symptoms may get worse, but then will improve as you continue your treatment. You may get hot flashes, increased bone pain, increased difficulty passing urine, or an aggravation of nerve symptoms. Discuss these effects with your doctor or health care professional, some of them may improve with continued use of this medicine. Male patients may experience a menstrual cycle or spotting during the first months of therapy with this medicine. If this continues, contact your doctor or health care professional. What side effects may I notice from   receiving this medicine? Side effects that you should report to your doctor or health care professional as soon as possible: -allergic reactions like  skin rash, itching or hives, swelling of the face, lips, or tongue -breathing problems -chest pain -depression or memory disorders -pain in your legs or groin -pain at site where injected or implanted -severe headache -swelling of the feet and legs -visual changes -vomiting Side effects that usually do not require medical attention (report to your doctor or health care professional if they continue or are bothersome): -breast swelling or tenderness -decrease in sex drive or performance -diarrhea -hot flashes -loss of appetite -muscle, joint, or bone pains -nausea -redness or irritation at site where injected or implanted -skin problems or acne This list may not describe all possible side effects. Call your doctor for medical advice about side effects. You may report side effects to FDA at 1-800-FDA-1088. Where should I keep my medicine? This drug is given in a hospital or clinic and will not be stored at home. NOTE: This sheet is a summary. It may not cover all possible information. If you have questions about this medicine, talk to your doctor, pharmacist, or health care provider.  2015, [employer] Standard. (2010-02-17 14:41:21)  

## 2019-10-11 ENCOUNTER — Ambulatory Visit: Payer: Medicare PPO | Attending: Internal Medicine

## 2019-10-11 DIAGNOSIS — Z23 Encounter for immunization: Secondary | ICD-10-CM | POA: Insufficient documentation

## 2019-10-11 NOTE — Progress Notes (Signed)
   Covid-19 Vaccination Clinic  Name:  Richard [name]    MRN: [id number] DOB: 1945-09-01  10/11/2019  Richard [name] was observed post Covid-19 immunization for 15 minutes without incidence. He was provided with Vaccine Information Sheet and instruction to access the V-Safe system.   Richard [name] was instructed to call 911 with any severe reactions post vaccine: Marland Kitchen Difficulty breathing  . Swelling of your face and throat  . A fast heartbeat  . A bad rash all over your body  . Dizziness and weakness    Immunizations Administered    Name Date Dose VIS Date Route   Pfizer COVID-19 Vaccine 10/11/2019 12:13 PM 0.3 mL 08/10/2019 Intramuscular   Manufacturer: [place]   Lot: [id number]   [place]: [id number]

## 2019-11-12 ENCOUNTER — Other Ambulatory Visit (INDEPENDENT_AMBULATORY_CARE_PROVIDER_SITE_OTHER): Payer: Medicare PPO

## 2019-11-12 ENCOUNTER — Other Ambulatory Visit: Payer: Self-pay

## 2019-11-12 DIAGNOSIS — E119 Type 2 diabetes mellitus without complications: Secondary | ICD-10-CM

## 2019-11-12 DIAGNOSIS — D519 Vitamin B12 deficiency anemia, unspecified: Secondary | ICD-10-CM | POA: Diagnosis not present

## 2019-11-12 DIAGNOSIS — E782 Mixed hyperlipidemia: Secondary | ICD-10-CM | POA: Diagnosis not present

## 2019-11-12 DIAGNOSIS — T50905A Adverse effect of unspecified drugs, medicaments and biological substances, initial encounter: Secondary | ICD-10-CM

## 2019-11-12 LAB — COMPREHENSIVE METABOLIC PANEL
ALT: 12 U/L (ref 0–53)
AST: 16 U/L (ref 0–37)
Albumin: 3.9 g/dL (ref 3.5–5.2)
Alkaline Phosphatase: 51 U/L (ref 39–117)
BUN: 35 mg/dL — ABNORMAL HIGH (ref 6–23)
CO2: 28 mEq/L (ref 19–32)
Calcium: 9.4 mg/dL (ref 8.4–10.5)
Chloride: 104 mEq/L (ref 96–112)
Creatinine, Ser: 1.51 mg/dL — ABNORMAL HIGH (ref 0.40–1.50)
GFR: 45.3 mL/min — ABNORMAL LOW (ref >60.00)
Glucose, Bld: 121 mg/dL — ABNORMAL HIGH (ref 70–99)
Potassium: 4.7 mEq/L (ref 3.5–5.1)
Sodium: 139 mEq/L (ref 135–145)
Total Bilirubin: 0.4 mg/dL (ref 0.2–1.2)
Total Protein: 6.3 g/dL (ref 6.0–8.3)

## 2019-11-12 LAB — LIPID PANEL
Cholesterol: 174 mg/dL (ref 0–200)
HDL: 61.7 mg/dL (ref >39.00)
LDL Cholesterol: 82 mg/dL (ref 0–99)
NonHDL: 112.01
Total CHOL/HDL Ratio: 3
Triglycerides: 148 mg/dL (ref 0.0–149.0)
VLDL: 29.6 mg/dL (ref 0.0–40.0)

## 2019-11-12 LAB — MICROALBUMIN / CREATININE URINE RATIO
Creatinine,U: 127.7 mg/dL
Microalb Creat Ratio: 6 mg/g (ref 0.0–30.0)
Microalb, Ur: 7.6 mg/dL — ABNORMAL HIGH (ref 0.0–1.9)

## 2019-11-12 LAB — HEMOGLOBIN A1C: Hgb A1c MFr Bld: 6 % (ref 4.6–6.5)

## 2019-11-12 LAB — VITAMIN B12: Vitamin B-12: 370 pg/mL (ref 211–911)

## 2019-11-13 NOTE — Progress Notes (Signed)
Patient ID: Richard [name], male   DOB: 1946/07/07, 74 y.o.   MRN: [id number]    Reason for Appointment:  Follow-up  History of Present Illness   Problem 1:  DIABETES MELITUS, date of diagnosis 1995 with a glucose of 320 and HemoglobinA1c of 9.6 Previous history: He has been on various oral hypoglycemic drugs in the past but for the last few years has required multiple drugs in combination; diabetes has been generally well controlled with A1c near normal  Recent history:   Oral hypoglycemic drugs: Amaryl 2 mg once a day in a.m., metformin 1000 twice a day, Actos 15 mg daily  His A1c is 6.0, was 6  Current management, blood sugar patterns and problems identified:  He has checked some blood sugars about 3 times a week but most of them are in the morning or midday  Highest blood sugar was 170 fasting but he has had readings as low as 98 in the morning  Not clear why he has occasional higher readings, likely to be from diet the night before  Recently has not been doing any exercise and his weight is slowly going up  He has been on the same 3 drug regimen for quite some time  He has not felt any symptoms of hypoglycemia at any time  Side effects from medications: None       Monitors blood glucose:  irregularly     Glucometer: One Touch ultra 2 .          Home blood Glucose readings:   MORNING 98-170, lunchtime 120, 135 Dinnertime 105   Hypoglycemia:  none  Dietician visit: Most recent:? 2007    Weight control:      Wt Readings from Last 3 Encounters:  11/14/19 220 lb 6.4 oz (100 kg)  10/04/19 218 lb 8 oz (99.1 kg)  05/31/19 215 lb 1.6 oz (97.6 kg)    Diabetes labs:  Lab Results  Component Value Date   HGBA1C 6.0 11/12/2019   HGBA1C 6.0 04/30/2019   HGBA1C 6.2 07/26/2018   Lab Results  Component Value Date   MICROALBUR 7.6 (H) 11/12/2019   LDLCALC 82 11/12/2019   CREATININE 1.51 (H) 11/12/2019    Other problems: See review of systems   Lab  on 11/12/2019  Component Date Value Ref Range Status  . Vitamin B-12 11/12/2019 370  211 - 911 pg/mL Final  . Microalb, Ur 11/12/2019 7.6* 0.0 - 1.9 mg/dL Final  . Creatinine,U 11/12/2019 127.7  mg/dL Final  . Microalb Creat Ratio 11/12/2019 6.0  0.0 - 30.0 mg/g Final  . Cholesterol 11/12/2019 174  0 - 200 mg/dL Final   ATP III Classification       Desirable:  < 200 mg/dL               Borderline High:  200 - 239 mg/dL          High:  > = 240 mg/dL  . Triglycerides 11/12/2019 148.0  0.0 - 149.0 mg/dL Final   Normal:  <150 mg/dLBorderline High:  150 - 199 mg/dL  . HDL 11/12/2019 61.70  >39.00 mg/dL Final  . VLDL 11/12/2019 29.6  0.0 - 40.0 mg/dL Final  . LDL Cholesterol 11/12/2019 82  0 - 99 mg/dL Final  . Total CHOL/HDL Ratio 11/12/2019 3   Final                  Men          Women1/2  Average Risk     3.4          3.3Average Risk          5.0          4.42X Average Risk          9.6          7.13X Average Risk          15.0          11.0                      . NonHDL 11/12/2019 112.01   Final   NOTE:  Non-HDL goal should be 30 mg/dL higher than patient's LDL goal (i.e. LDL goal of < 70 mg/dL, would have non-HDL goal of < 100 mg/dL)  . Sodium 11/12/2019 139  135 - 145 mEq/L Final  . Potassium 11/12/2019 4.7  3.5 - 5.1 mEq/L Final  . Chloride 11/12/2019 104  96 - 112 mEq/L Final  . CO2 11/12/2019 28  19 - 32 mEq/L Final  . Glucose, Bld 11/12/2019 121* 70 - 99 mg/dL Final  . BUN 11/12/2019 35* 6 - 23 mg/dL Final  . Creatinine, Ser 11/12/2019 1.51* 0.40 - 1.50 mg/dL Final  . Total Bilirubin 11/12/2019 0.4  0.2 - 1.2 mg/dL Final  . Alkaline Phosphatase 11/12/2019 51  39 - 117 U/L Final  . AST 11/12/2019 16  0 - 37 U/L Final  . ALT 11/12/2019 12  0 - 53 U/L Final  . Total Protein 11/12/2019 6.3  6.0 - 8.3 g/dL Final  . Albumin 11/12/2019 3.9  3.5 - 5.2 g/dL Final  . GFR 11/12/2019 45.30* >60.00 mL/min Final  . Calcium 11/12/2019 9.4  8.4 - 10.5 mg/dL Final  . Hgb A1c MFr Bld 11/12/2019 6.0   4.6 - 6.5 % Final   Glycemic Control Guidelines for People with Diabetes:Non Diabetic:  <6%Goal of Therapy: <7%Additional Action Suggested:  >8%      Allergies as of 11/14/2019      Reactions   Effexor Xr [venlafaxine Hcl] Rash      Medication List       Accurate as of November 14, 2019 11:59 PM. If you have any questions, ask your nurse or doctor.        amLODipine 5 MG tablet Commonly known as: NORVASC TAKE 1 TABLET(5 MG) BY MOUTH DAILY   atorvastatin 10 MG tablet Commonly known as: LIPITOR TAKE 1 TABLET(10 MG) BY MOUTH DAILY   Durezol 0.05 % Emul Generic drug: Difluprednate Place 1 drop into the left eye 3 (three) times daily.   glimepiride 2 MG tablet Commonly known as: AMARYL TAKE 1 TABLET (2 MG TOTAL) BY MOUTH DAILY WITH BREAKFAST.   glucose blood test strip Commonly known as: ONE TOUCH ULTRA TEST Use as instructed to check blood sugar 3 times per week dx code E11.9   LUPRON DEPOT (17-MONTH) IM Inject into the muscle.   metFORMIN 1000 MG tablet Commonly known as: GLUCOPHAGE TAKE 1 TABLET(1000 MG) BY MOUTH TWICE DAILY WITH A MEAL   ONE TOUCH ULTRA 2 w/Device Kit Use to check blood sugar 3 times per week dx code N46.2   OneTouch Delica Lancets Fine Misc Use to check blood sugar 3 times per week dx code E11.9   pioglitazone 30 MG tablet Commonly known as: ACTOS TAKE 1 TABLET(30 MG) BY MOUTH DAILY   Prolensa 0.07 % Soln Generic drug: Bromfenac Sodium Place 1 drop into the  left eye at bedtime.   ramipril 5 MG capsule Commonly known as: ALTACE TAKE 1 CAPSULE (10 MG TOTAL) BY MOUTH DAILY. What changed: additional instructions   sertraline 50 MG tablet Commonly known as: ZOLOFT Take 50 mg by mouth daily.   triamterene-hydrochlorothiazide 37.5-25 MG capsule Commonly known as: DYAZIDE Take 1 each (1 capsule total) by mouth daily.       Allergies:  Allergies  Allergen Reactions  . Effexor Xr [Venlafaxine Hcl] Rash    Past Medical History:   Diagnosis Date  . Diabetes mellitus without complication ([place])   . Hypertension   . prostate ca dx'd 2006   prostatectomy and xrt    History reviewed. No pertinent surgical history.  Family History  Problem Relation Age of Onset  . Hypertension Father   . Heart disease Neg Hx   . Diabetes Neg Hx     Social History:  reports that he has been smoking. He has never used smokeless tobacco. No history on file for alcohol and drug.    REVIEW OF SYSTEMS:       Hypertension:  blood pressure has been  well controlled with 5 mg ramipril, amlodipine 5 mg and Dyazide He apparently was given Dyazide by an ENT doctor Not monitoring blood pressure at home Has not had any recent lightheadedness but his blood pressure is relatively low standing   BP Readings from Last 3 Encounters:  11/14/19 (!) 110/55  10/04/19 137/77  05/31/19 123/60   Has slightly variable but high creatinine values, relatively higher compared to last month  Lab Results  Component Value Date   CREATININE 1.51 (H) 11/12/2019   CREATININE 1.35 (H) 10/02/2019   CREATININE 1.48 (H) 05/29/2019     Lipids:  he has been treated with Lipitor 10 mg which he takes daily LDL is controlled is also triglycerides    Lab Results  Component Value Date   CHOL 174 11/12/2019   HDL 61.70 11/12/2019   LDLCALC 82 11/12/2019   TRIG 148.0 11/12/2019   CHOLHDL 3 11/12/2019     History of vitamin B12 deficiency,he is taking this regularly Has mild chronic anemia B12 level is normal  CBC followed by oncologist    He is taking sertraline from his PCP for depression  Last eye exam was in 8/20   LABS:  Lab on 11/12/2019  Component Date Value Ref Range Status  . Vitamin B-12 11/12/2019 370  211 - 911 pg/mL Final  . Microalb, Ur 11/12/2019 7.6* 0.0 - 1.9 mg/dL Final  . Creatinine,U 11/12/2019 127.7  mg/dL Final  . Microalb Creat Ratio 11/12/2019 6.0  0.0 - 30.0 mg/g Final  . Cholesterol 11/12/2019 174  0 - 200 mg/dL  Final   ATP III Classification       Desirable:  < 200 mg/dL               Borderline High:  200 - 239 mg/dL          High:  > = 240 mg/dL  . Triglycerides 11/12/2019 148.0  0.0 - 149.0 mg/dL Final   Normal:  <150 mg/dLBorderline High:  150 - 199 mg/dL  . HDL 11/12/2019 61.70  >39.00 mg/dL Final  . VLDL 11/12/2019 29.6  0.0 - 40.0 mg/dL Final  . LDL Cholesterol 11/12/2019 82  0 - 99 mg/dL Final  . Total CHOL/HDL Ratio 11/12/2019 3   Final  Men          Women1/2 Average Risk     3.4          3.3Average Risk          5.0          4.42X Average Risk          9.6          7.13X Average Risk          15.0          11.0                      . NonHDL 11/12/2019 112.01   Final   NOTE:  Non-HDL goal should be 30 mg/dL higher than patient's LDL goal (i.e. LDL goal of < 70 mg/dL, would have non-HDL goal of < 100 mg/dL)  . Sodium 11/12/2019 139  135 - 145 mEq/L Final  . Potassium 11/12/2019 4.7  3.5 - 5.1 mEq/L Final  . Chloride 11/12/2019 104  96 - 112 mEq/L Final  . CO2 11/12/2019 28  19 - 32 mEq/L Final  . Glucose, Bld 11/12/2019 121* 70 - 99 mg/dL Final  . BUN 11/12/2019 35* 6 - 23 mg/dL Final  . Creatinine, Ser 11/12/2019 1.51* 0.40 - 1.50 mg/dL Final  . Total Bilirubin 11/12/2019 0.4  0.2 - 1.2 mg/dL Final  . Alkaline Phosphatase 11/12/2019 51  39 - 117 U/L Final  . AST 11/12/2019 16  0 - 37 U/L Final  . ALT 11/12/2019 12  0 - 53 U/L Final  . Total Protein 11/12/2019 6.3  6.0 - 8.3 g/dL Final  . Albumin 11/12/2019 3.9  3.5 - 5.2 g/dL Final  . GFR 11/12/2019 45.30* >60.00 mL/min Final  . Calcium 11/12/2019 9.4  8.4 - 10.5 mg/dL Final  . Hgb A1c MFr Bld 11/12/2019 6.0  4.6 - 6.5 % Final   Glycemic Control Guidelines for People with Diabetes:Non Diabetic:  <6%Goal of Therapy: <7%Additional Action Suggested:  >8%      Examination:   BP (!) 110/55 (Patient Position: Standing)   Pulse 90   Ht _0  (1.88 m)   Wt 220 lb 6.4 oz (100 kg)   SpO2 95%   BMI 28.30 kg/m   Body mass  index is 28.3 kg/m.   Diabetic Foot Exam - Simple   Simple Foot Form Diabetic Foot exam was performed with the following findings: Yes   Visual Inspection No deformities, no ulcerations, no other skin breakdown bilaterally: Yes Sensation Testing Intact to touch and monofilament testing bilaterally: Yes Pulse Check Posterior Tibialis and Dorsalis pulse intact bilaterally: Yes Comments      ASSESSMENT/ PLAN:    Diabetes type 2 with mild obesity:   See history of present illness for detailed discussion of current diabetes management, blood sugar patterns and problems identified   His A1c is consistently in the upper normal range and again 6%  Although he has gained a little weight is blood sugar control is about the same Occasional high readings are probably related to changes in diet at night He thinks he can start getting back to walking or other exercise now that the weather is improving  Also encouraged him to check more readings after dinner  He will stay on his regimen of metformin, Amaryl 2 mg and Actos To let us know if he has any symptoms of hypoglycemia  Hypertension: Blood pressure is low standing up Coupled with his relatively  high creatinine level looks like he does not need to be on a diuretic Also had a mild increase in potassium previously from combining ramipril and triamterene  Encouraged him to start checking his blood pressure but needs short-term follow-up to make sure his renal function and blood pressure are improved  LIPIDS: LDL well controlled, triglycerides are also better  B12 deficiency: Continue long-term supplements  Annual eye exams   Elayne Snare 11/15/2019, 9:13 AM     Note: This office note was prepared with Dragon voice recognition system technology. Any transcriptional errors that result from this process are unintentional.

## 2019-11-14 ENCOUNTER — Ambulatory Visit (INDEPENDENT_AMBULATORY_CARE_PROVIDER_SITE_OTHER): Payer: Medicare PPO | Admitting: Endocrinology

## 2019-11-14 ENCOUNTER — Other Ambulatory Visit: Payer: Self-pay

## 2019-11-14 ENCOUNTER — Encounter: Payer: Self-pay | Admitting: Endocrinology

## 2019-11-14 VITALS — BP 110/55 | HR 90 | Ht 74.0 in | Wt 220.4 lb

## 2019-11-14 DIAGNOSIS — E119 Type 2 diabetes mellitus without complications: Secondary | ICD-10-CM | POA: Diagnosis not present

## 2019-11-14 DIAGNOSIS — E782 Mixed hyperlipidemia: Secondary | ICD-10-CM

## 2019-11-14 DIAGNOSIS — I10 Essential (primary) hypertension: Secondary | ICD-10-CM | POA: Diagnosis not present

## 2019-11-14 DIAGNOSIS — N289 Disorder of kidney and ureter, unspecified: Secondary | ICD-10-CM | POA: Diagnosis not present

## 2019-11-14 NOTE — Patient Instructions (Addendum)
Stop Dyazide  Walk daily

## 2020-01-22 ENCOUNTER — Other Ambulatory Visit: Payer: Self-pay

## 2020-01-22 ENCOUNTER — Inpatient Hospital Stay: Payer: Medicare PPO | Attending: Oncology

## 2020-01-22 DIAGNOSIS — E119 Type 2 diabetes mellitus without complications: Secondary | ICD-10-CM | POA: Insufficient documentation

## 2020-01-22 DIAGNOSIS — D649 Anemia, unspecified: Secondary | ICD-10-CM | POA: Insufficient documentation

## 2020-01-22 DIAGNOSIS — C61 Malignant neoplasm of prostate: Secondary | ICD-10-CM | POA: Insufficient documentation

## 2020-01-22 LAB — CBC WITH DIFFERENTIAL (CANCER CENTER ONLY)
Abs Immature Granulocytes: 0.03 10*3/uL (ref 0.00–0.07)
Basophils Absolute: 0.1 10*3/uL (ref 0.0–0.1)
Basophils Relative: 1 %
Eosinophils Absolute: 0 10*3/uL (ref 0.0–0.5)
Eosinophils Relative: 0 %
HCT: 36.3 % — ABNORMAL LOW (ref 39.0–52.0)
Hemoglobin: 11.6 g/dL — ABNORMAL LOW (ref 13.0–17.0)
Immature Granulocytes: 1 %
Lymphocytes Relative: 24 %
Lymphs Abs: 1.4 10*3/uL (ref 0.7–4.0)
MCH: 31.8 pg (ref 26.0–34.0)
MCHC: 32 g/dL (ref 30.0–36.0)
MCV: 99.5 fL (ref 80.0–100.0)
Monocytes Absolute: 0.6 10*3/uL (ref 0.1–1.0)
Monocytes Relative: 10 %
Neutro Abs: 3.9 10*3/uL (ref 1.7–7.7)
Neutrophils Relative %: 64 %
Platelet Count: 218 10*3/uL (ref 150–400)
RBC: 3.65 MIL/uL — ABNORMAL LOW (ref 4.22–5.81)
RDW: 12.3 % (ref 11.5–15.5)
WBC Count: 6 10*3/uL (ref 4.0–10.5)
nRBC: 0 % (ref 0.0–0.2)

## 2020-01-22 LAB — CMP (CANCER CENTER ONLY)
ALT: 13 U/L (ref 0–44)
AST: 17 U/L (ref 15–41)
Albumin: 3.9 g/dL (ref 3.5–5.0)
Alkaline Phosphatase: 65 U/L (ref 38–126)
Anion gap: 9 (ref 5–15)
BUN: 23 mg/dL (ref 8–23)
CO2: 27 mmol/L (ref 22–32)
Calcium: 9.2 mg/dL (ref 8.9–10.3)
Chloride: 103 mmol/L (ref 98–111)
Creatinine: 1.65 mg/dL — ABNORMAL HIGH (ref 0.61–1.24)
GFR, Est AFR Am: 46 mL/min — ABNORMAL LOW (ref >60)
GFR, Estimated: 40 mL/min — ABNORMAL LOW (ref >60)
Glucose, Bld: 96 mg/dL (ref 70–99)
Potassium: 5.1 mmol/L (ref 3.5–5.1)
Sodium: 139 mmol/L (ref 135–145)
Total Bilirubin: 0.5 mg/dL (ref 0.3–1.2)
Total Protein: 6.5 g/dL (ref 6.5–8.1)

## 2020-01-23 LAB — PROSTATE-SPECIFIC AG, SERUM (LABCORP): Prostate Specific Ag, Serum: <0.1 ng/mL (ref 0.0–4.0)

## 2020-01-24 ENCOUNTER — Inpatient Hospital Stay (HOSPITAL_BASED_OUTPATIENT_CLINIC_OR_DEPARTMENT_OTHER): Payer: Medicare PPO | Admitting: Oncology

## 2020-01-24 ENCOUNTER — Inpatient Hospital Stay: Payer: Medicare PPO

## 2020-01-24 VITALS — BP 142/77 | HR 83 | Temp 97.8°F | Resp 18 | Ht 74.0 in | Wt 218.9 lb

## 2020-01-24 DIAGNOSIS — C61 Malignant neoplasm of prostate: Secondary | ICD-10-CM | POA: Diagnosis not present

## 2020-01-24 MED ORDER — LEUPROLIDE ACETATE (4 MONTH) 30 MG ~~LOC~~ KIT: 30.0000 mg | PACK | Freq: Once | SUBCUTANEOUS | Status: DC

## 2020-01-24 NOTE — Progress Notes (Signed)
Hematology and Oncology Follow Up Visit  Richard [name] [id number] 02/01/1946 74 y.o. 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  He presented with Gleason score 4+3 = 7 with a PSA of 3.66 and localized disease in 2006.  Prior Therapy: 1. He is status post retropubic prostatectomy and bilateral lymph node dissection done on 11/25/2004. The pathology showed a Gleason score of 4+4 equals 8 with staging of T2b N0.  2. Sstatus post salvage radiation therapy done between March and May of 2007 for a rise in his PSA to 0.29. He received total of 65 gray in 35 fractions. 3.  He was treated with intermittent Lupron until 2016.  Lupron has been given at that time.  Current therapy:   Lupron 30 mg every 4 months started in April 2016.  He received Eligard on October 1 of 2020.  He is due to receive it today and repeated every 4 months.  Interim History: Richard [name] is here for a follow-up visit.  Since the last visit, he reports no major changes in his health.  He continues to tolerate Eligard without any complications.  He denies any nausea, fatigue or weight gain.  He denies any bone pain or pathological fractures.  He denies any recent hospitalizations or illnesses.  As performance status and quality of life remains unchanged.            Medications: Reviewed without changes.  Current Outpatient Medications on File Prior to Visit  Medication Sig Dispense Refill  . amLODipine (NORVASC) 5 MG tablet TAKE 1 TABLET(5 MG) BY MOUTH DAILY 90 tablet 0  . atorvastatin (LIPITOR) 10 MG tablet TAKE 1 TABLET(10 MG) BY MOUTH DAILY 90 tablet 0  . Blood Glucose Monitoring Suppl (ONE TOUCH ULTRA 2) W/DEVICE KIT Use to check blood sugar 3 times per week dx code E11.9 (Patient taking differently: Use to check blood sugar 3 times per week dx code E11.9) 1 each 0  . DUREZOL 0.05 % EMUL Place 1 drop into the  left eye 3 (three) times daily.    Marland Kitchen glimepiride (AMARYL) 2 MG tablet TAKE 1 TABLET (2 MG TOTAL) BY MOUTH DAILY WITH BREAKFAST. 90 tablet 0  . glucose blood (ONE TOUCH ULTRA TEST) test strip Use as instructed to check blood sugar 3 times per week dx code E11.9 100 each 1  . Leuprolide Acetate (LUPRON DEPOT, 38-MONTH, IM) Inject into the muscle.    . metFORMIN (GLUCOPHAGE) 1000 MG tablet TAKE 1 TABLET(1000 MG) BY MOUTH TWICE DAILY WITH A MEAL 180 tablet 0  . ONETOUCH DELICA LANCETS FINE MISC Use to check blood sugar 3 times per week dx code E11.9 100 each 3  . pioglitazone (ACTOS) 30 MG tablet TAKE 1 TABLET(30 MG) BY MOUTH DAILY 90 tablet 0  . PROLENSA 0.07 % SOLN Place 1 drop into the left eye at bedtime.    . ramipril (ALTACE) 5 MG capsule TAKE 1 CAPSULE (10 MG TOTAL) BY MOUTH DAILY. (Patient taking differently: TAKE 1 CAPSULE (10 MG TOTAL) BY MOUTH TWICE DAILY.) 90 capsule 2  . sertraline (ZOLOFT) 50 MG tablet Take 50 mg by mouth daily.    Marland Kitchen triamterene-hydrochlorothiazide (DYAZIDE) 37.5-25 MG capsule Take 1 each (1 capsule total) by mouth daily. 90 capsule 3   Current Facility-Administered Medications on File Prior to Visit  Medication Dose Route Frequency Provider Last Rate Last Admin  . Leuprolide Acetate (4 Month) (ELIGARD) injection 30  mg  30 mg Subcutaneous Once Wyatt Portela, MD         Allergies:  Allergies  Allergen Reactions  . Effexor Xr [Venlafaxine Hcl] Rash      . Physical Exam:   Blood pressure (!) 142/77, pulse 83, temperature 97.8 F (36.6 C), temperature source Temporal, resp. rate 18, height '6\' 2"'  (1.88 m), weight 218 lb 14.4 oz (99.3 kg), SpO2 100 %.     ECOG: 0    General appearance: Alert, awake without any distress. Head: Atraumatic without abnormalities Oropharynx: Without any thrush or ulcers. Eyes: No scleral icterus. Lymph nodes: No lymphadenopathy noted in the cervical, supraclavicular, or axillary nodes Heart:regular rate and rhythm,  without any murmurs or gallops.   Lung: Clear to auscultation without any rhonchi, wheezes or dullness to percussion. Abdomin: Soft, nontender without any shifting dullness or ascites. Musculoskeletal: No clubbing or cyanosis. Neurological: No motor or sensory deficits. Skin: No rashes or lesions.       Lab Results: Lab Results  Component Value Date   WBC 6.0 01/22/2020   HGB 11.6 (L) 01/22/2020   HCT 36.3 (L) 01/22/2020   MCV 99.5 01/22/2020   PLT 218 01/22/2020     Chemistry      Component Value Date/Time   NA 139 01/22/2020 1244   NA 140 05/24/2017 0810   K 5.1 01/22/2020 1244   K 4.4 05/24/2017 0810   CL 103 01/22/2020 1244   CL 102 02/20/2013 1254   CO2 27 01/22/2020 1244   CO2 25 05/24/2017 0810   BUN 23 01/22/2020 1244   BUN 24.7 05/24/2017 0810   CREATININE 1.65 (H) 01/22/2020 1244   CREATININE 1.2 05/24/2017 0810      Component Value Date/Time   CALCIUM 9.2 01/22/2020 1244   CALCIUM 9.3 05/24/2017 0810   ALKPHOS 65 01/22/2020 1244   ALKPHOS 60 05/24/2017 0810   AST 17 01/22/2020 1244   AST 14 05/24/2017 0810   ALT 13 01/22/2020 1244   ALT 9 05/24/2017 0810   BILITOT 0.5 01/22/2020 1244   BILITOT 0.41 05/24/2017 0810          Results for Richard [name], Richard "MIKE" (MRN [id number]) as of 01/24/2020 15:24  Ref. 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  The natural course of this disease was discussed in detail and treatment options were reviewed.  He remains on androgen deprivation therapy alone and the role for therapy escalation as well as additional therapy in case he developed castration-resistant disease were reviewed.  These options include [name], and other options.  For the time being and no additional therapy is needed.  His PSA  continues to be undetectable.   2.  Androgen deprivation therapy: Risks and benefits of continuing androgen deprivation were reviewed.  Complications such as osteoporosis, hot flashes, weight gain were reviewed.  He is agreeable proceed at this time.   3. Diabetes mellitus: No recent issues or exacerbation noted with blood sugar related to androgen deprivation..  4.  Anemia: Hemoglobin remained stable without any decline at this time.  His anemia is multifactorial related to chronic disease.  5. Followup: In 4 months for a follow-up visit.  30 minutes was spent on this visit.  The time was dedicated to updating his disease status, reviewing laboratory  data and outlining future plan of care.    Zola Button, MD 5/27/20213:22 PM

## 2020-01-25 ENCOUNTER — Telehealth: Payer: Self-pay | Admitting: Oncology

## 2020-01-25 NOTE — Telephone Encounter (Signed)
Scheduled appt per 5/27 los.  Left a vm of the appt date and time.

## 2020-01-30 ENCOUNTER — Other Ambulatory Visit (INDEPENDENT_AMBULATORY_CARE_PROVIDER_SITE_OTHER): Payer: Medicare PPO

## 2020-01-30 ENCOUNTER — Other Ambulatory Visit: Payer: Self-pay

## 2020-01-30 DIAGNOSIS — E119 Type 2 diabetes mellitus without complications: Secondary | ICD-10-CM | POA: Diagnosis not present

## 2020-01-30 LAB — BASIC METABOLIC PANEL
BUN: 32 mg/dL — ABNORMAL HIGH (ref 6–23)
CO2: 30 mEq/L (ref 19–32)
Calcium: 9.2 mg/dL (ref 8.4–10.5)
Chloride: 105 mEq/L (ref 96–112)
Creatinine, Ser: 1.43 mg/dL (ref 0.40–1.50)
GFR: 48.21 mL/min — ABNORMAL LOW (ref >60.00)
Glucose, Bld: 95 mg/dL (ref 70–99)
Potassium: 4.9 mEq/L (ref 3.5–5.1)
Sodium: 138 mEq/L (ref 135–145)

## 2020-01-31 LAB — FRUCTOSAMINE: Fructosamine: 228 umol/L (ref 0–285)

## 2020-01-31 NOTE — Progress Notes (Signed)
Patient ID: DYER KLUG, male   DOB: 1946-03-14, 74 y.o.   MRN: [id number]    Reason for Appointment:  Follow-up of blood pressure  History of Present Illness   Problem 1: HYPERTENSION: See review of systems for today's notes  DIABETES MELITUS, date of diagnosis 1995 with a glucose of 320 and HemoglobinA1c of 9.6 Previous history: He has been on various oral hypoglycemic drugs in the past but for the last few years has required multiple drugs in combination; diabetes has been generally well controlled with A1c near normal  Recent history: The following is a copy of the previous note  Oral hypoglycemic drugs: Amaryl 2 mg once a day in a.m., metformin 1000 twice a day, Actos 15 mg daily  His A1c last was 6   Current management, blood sugar patterns and problems identified:  He has checked some blood sugars about 3 times a week but most of them are in the morning or midday  Highest blood sugar was 170 fasting but he has had readings as low as 98 in the morning  Not clear why he has occasional higher readings, likely to be from diet the night before  Recently has not been doing any exercise and his weight is slowly going up  He has been on the same 3 drug regimen for quite some time  He has not felt any symptoms of hypoglycemia at any time  Side effects from medications: None       Monitors blood glucose:  irregularly     Glucometer: One Touch ultra 2 .          Home blood Glucose readings:   MORNING 98-170, lunchtime 120, 135 Dinnertime 105   Hypoglycemia:  none  Dietician visit: Most recent:? 2007    Weight control:      Wt Readings from Last 3 Encounters:  02/01/20 219 lb 9.6 oz (99.6 kg)  01/24/20 218 lb 14.4 oz (99.3 kg)  11/14/19 220 lb 6.4 oz (100 kg)    Diabetes labs:  Lab Results  Component Value Date   HGBA1C 6.0 11/12/2019   HGBA1C 6.0 04/30/2019   HGBA1C 6.2 07/26/2018   Lab Results  Component Value Date   MICROALBUR 7.6 (H)  11/12/2019   LDLCALC 82 11/12/2019   CREATININE 1.43 01/30/2020    Other problems: See review of systems   Lab on 01/30/2020  Component Date Value Ref Range Status  . Fructosamine 01/30/2020 228  0 - 285 umol/L Final   Comment: Published reference interval for apparently healthy subjects between age 91 and 41 is 40 - 285 umol/L and in a poorly controlled diabetic population is 228 - 563 umol/L with a mean of 396 umol/L.   Marland Kitchen Sodium 01/30/2020 138  135 - 145 mEq/L Final  . Potassium 01/30/2020 4.9  3.5 - 5.1 mEq/L Final  . Chloride 01/30/2020 105  96 - 112 mEq/L Final  . CO2 01/30/2020 30  19 - 32 mEq/L Final  . Glucose, Bld 01/30/2020 95  70 - 99 mg/dL Final  . BUN 01/30/2020 32* 6 - 23 mg/dL Final  . Creatinine, Ser 01/30/2020 1.43  0.40 - 1.50 mg/dL Final  . GFR 01/30/2020 48.21* >60.00 mL/min Final  . Calcium 01/30/2020 9.2  8.4 - 10.5 mg/dL Final     Allergies as of 02/01/2020      Reactions   Effexor Xr [venlafaxine Hcl] Rash      Medication List       Accurate  as of February 01, 2020  8:17 AM. If you have any questions, ask your nurse or doctor.        STOP taking these medications   sertraline 50 MG tablet Commonly known as: ZOLOFT Stopped by: Elayne Snare, MD   triamterene-hydrochlorothiazide 37.5-25 MG capsule Commonly known as: DYAZIDE Stopped by: Elayne Snare, MD     TAKE these medications   amLODipine 5 MG tablet Commonly known as: NORVASC TAKE 1 TABLET(5 MG) BY MOUTH DAILY   atorvastatin 10 MG tablet Commonly known as: LIPITOR TAKE 1 TABLET(10 MG) BY MOUTH DAILY   Durezol 0.05 % Emul Generic drug: Difluprednate Place 1 drop into the left eye 3 (three) times daily.   glimepiride 2 MG tablet Commonly known as: AMARYL TAKE 1 TABLET (2 MG TOTAL) BY MOUTH DAILY WITH BREAKFAST.   glucose blood test strip Commonly known as: ONE TOUCH ULTRA TEST Use as instructed to check blood sugar 3 times per week dx code E11.9   LUPRON DEPOT (62-MONTH) IM Inject  into the muscle.   metFORMIN 1000 MG tablet Commonly known as: GLUCOPHAGE TAKE 1 TABLET(1000 MG) BY MOUTH TWICE DAILY WITH A MEAL   ONE TOUCH ULTRA 2 w/Device Kit Use to check blood sugar 3 times per week dx code W41.3   OneTouch Delica Lancets Fine Misc Use to check blood sugar 3 times per week dx code E11.9   pioglitazone 30 MG tablet Commonly known as: ACTOS TAKE 1 TABLET(30 MG) BY MOUTH DAILY   Prolensa 0.07 % Soln Generic drug: Bromfenac Sodium Place 1 drop into the left eye at bedtime.   ramipril 5 MG capsule Commonly known as: ALTACE TAKE 1 CAPSULE (10 MG TOTAL) BY MOUTH DAILY. What changed: additional instructions       Allergies:  Allergies  Allergen Reactions  . Effexor Xr [Venlafaxine Hcl] Rash    Past Medical History:  Diagnosis Date  . Diabetes mellitus without complication ([place])   . Hypertension   . prostate ca dx'd 2006   prostatectomy and xrt    History reviewed. No pertinent surgical history.  Family History  Problem Relation Age of Onset  . Hypertension Father   . Heart disease Neg Hx   . Diabetes Neg Hx     Social History:  reports that he has been smoking. He has never used smokeless tobacco. No history on file for alcohol and drug.    REVIEW OF SYSTEMS:       Hypertension:  blood pressure has been  well controlled with 5 mg ramipril, amlodipine 5 mg On his last visit Dyazide was stopped and he is here for follow-up  He apparently was given Dyazide by an ENT doctor and this was stopped in 3/21 because of higher creatinine and low normal blood pressure  Not monitoring blood pressure at home even though he has a meter at home   BP Readings from Last 3 Encounters:  02/01/20 130/62  01/24/20 (!) 142/77  11/14/19 (!) 110/55   Has slightly variable but high creatinine values for some time Creatinine is better this week compared to previous readings He does not tolerate his potential to his fluid intake  Lab Results  Component  Value Date   CREATININE 1.43 01/30/2020   CREATININE 1.65 (H) 01/22/2020   CREATININE 1.51 (H) 11/12/2019     Lipids:  he has been treated with Lipitor 10 mg which he takes daily LDL as of last visit controlled as well as triglycerides    Lab Results  Component Value Date   CHOL 174 11/12/2019   HDL 61.70 11/12/2019   LDLCALC 82 11/12/2019   TRIG 148.0 11/12/2019   CHOLHDL 3 11/12/2019     History of vitamin B12 deficiency,he is taking this regularly Has mild chronic anemia B12 level is normal  CBC followed by oncologist    He is taking sertraline from his PCP for depression  Last eye exam was in 8/20   LABS:  Lab on 01/30/2020  Component Date Value Ref Range Status  . Fructosamine 01/30/2020 228  0 - 285 umol/L Final   Comment: Published reference interval for apparently healthy subjects between age 49 and 14 is 12 - 285 umol/L and in a poorly controlled diabetic population is 228 - 563 umol/L with a mean of 396 umol/L.   Marland Kitchen Sodium 01/30/2020 138  135 - 145 mEq/L Final  . Potassium 01/30/2020 4.9  3.5 - 5.1 mEq/L Final  . Chloride 01/30/2020 105  96 - 112 mEq/L Final  . CO2 01/30/2020 30  19 - 32 mEq/L Final  . Glucose, Bld 01/30/2020 95  70 - 99 mg/dL Final  . BUN 01/30/2020 32* 6 - 23 mg/dL Final  . Creatinine, Ser 01/30/2020 1.43  0.40 - 1.50 mg/dL Final  . GFR 01/30/2020 48.21* >60.00 mL/min Final  . Calcium 01/30/2020 9.2  8.4 - 10.5 mg/dL Final     Examination:   BP 130/62 (BP Location: Left Arm, Patient Position: Sitting, Cuff Size: Normal)   Pulse 68   Ht _0  (1.88 m)   Wt 219 lb 9.6 oz (99.6 kg)   SpO2 99%   BMI 28.19 kg/m   Body mass index is 28.19 kg/m.   No ankle edema  ASSESSMENT/ PLAN:    Diabetes type 2 with mild obesity:    He is doing well with starting exercise using elliptical at home and blood sugars are still fairly good with highest reading 142 reportedly  Hypertension: Blood pressure back to normal, previously low  normal with adding Dyazide No edema  Labs now show his creatinine appears to be fluctuating although slightly better than usual this week  Since he tends to have high normal potassium and higher creatinine with diuretics he will avoid these  We will continue ramipril 5 mg and Norvasc unchanged  Reminded him to check blood pressure couple of times a month at least  Follow-up in 6 months  Bricyn Labrada 02/01/2020, 8:17 AM     Note: This office note was prepared with Dragon voice recognition system technology. Any transcriptional errors that result from this process are unintentional.

## 2020-02-01 ENCOUNTER — Other Ambulatory Visit: Payer: Self-pay

## 2020-02-01 ENCOUNTER — Ambulatory Visit: Payer: Medicare PPO | Admitting: Endocrinology

## 2020-02-01 ENCOUNTER — Encounter: Payer: Self-pay | Admitting: Endocrinology

## 2020-02-01 ENCOUNTER — Encounter: Payer: Self-pay | Admitting: Oncology

## 2020-02-01 VITALS — BP 130/62 | HR 68 | Ht 74.0 in | Wt 219.6 lb

## 2020-02-01 DIAGNOSIS — N289 Disorder of kidney and ureter, unspecified: Secondary | ICD-10-CM

## 2020-02-01 DIAGNOSIS — I10 Essential (primary) hypertension: Secondary | ICD-10-CM | POA: Diagnosis not present

## 2020-02-01 DIAGNOSIS — E119 Type 2 diabetes mellitus without complications: Secondary | ICD-10-CM | POA: Diagnosis not present

## 2020-02-01 DIAGNOSIS — E782 Mixed hyperlipidemia: Secondary | ICD-10-CM

## 2020-05-05 ENCOUNTER — Ambulatory Visit
Admission: RE | Admit: 2020-05-05 | Discharge: 2020-05-05 | Disposition: A | Discharge status: Home / Self Care | Payer: Medicare PPO | Source: Ambulatory Visit | Attending: Family Medicine | Admitting: Family Medicine

## 2020-05-05 ENCOUNTER — Other Ambulatory Visit: Payer: Self-pay

## 2020-05-05 VITALS — BP 152/75 | HR 73 | Temp 98.2°F | Resp 18

## 2020-05-05 DIAGNOSIS — Z20822 Contact with and (suspected) exposure to covid-19: Secondary | ICD-10-CM | POA: Diagnosis not present

## 2020-05-05 DIAGNOSIS — J069 Acute upper respiratory infection, unspecified: Secondary | ICD-10-CM

## 2020-05-05 DIAGNOSIS — R229 Localized swelling, mass and lump, unspecified: Secondary | ICD-10-CM

## 2020-05-05 MED ORDER — CETIRIZINE HCL 10 MG PO TABS
10.0000 mg | ORAL_TABLET | Freq: Every day | ORAL | 11 refills | Status: DC
Start: 2020-05-05 — End: 2021-04-01

## 2020-05-05 MED ORDER — BENZONATATE 100 MG PO CAPS: 100.0000 mg | ORAL_CAPSULE | Freq: Three times a day (TID) | ORAL | 0 refills | Status: DC

## 2020-05-05 MED ORDER — FLUTICASONE PROPIONATE 50 MCG/ACT NA SUSP
2.0000 | Freq: Every day | NASAL | 12 refills | Status: DC
Start: 2020-05-05 — End: 2021-04-01

## 2020-05-05 NOTE — ED Provider Notes (Signed)
[facility]    CSN: [id number] Arrival date & time: 05/05/20  1124      History   Chief Complaint Chief Complaint  Patient presents with  . Appointment    12  . Cough    HPI BURNHAM TROST is a 74 y.o. male.   HPI  Patient presents today concern for cough and a bump on his right calf. Cough and sinus-like symptoms have been present for over 4 weeks.  He denies any known home contact with anyone positive for Covid.  He is regularly seen at the [facility].  To manage his current symptoms.  Over-the-counter medication.  Denies any shortness of breath, wheezing, nausea, vomiting or chest pain.  Also concerned about small bump that developed about 2 days ago on the right upper calf area.  Areas kind of hard and he does not recall pain.  Screening injury that occurred at work.  He has no known history of any hypercoagulability.  He is a type II diabetic.  Past Medical History:  Diagnosis Date  . Diabetes mellitus without complication ([place])   . Hypertension   . prostate ca dx'd 2006   prostatectomy and xrt    Patient Active Problem List   Diagnosis Date Noted  . Post traumatic stress disorder (PTSD) 07/15/2014  . Vitamin B 12 deficiency 12/12/2013  . H/O prostate cancer 12/12/2013  . Unspecified essential hypertension 08/10/2013  . Other and unspecified hyperlipidemia 08/10/2013  . Type II or unspecified type diabetes mellitus without mention of complication, not stated as uncontrolled 08/07/2013    History reviewed. No pertinent surgical history.     Home Medications    Prior to Admission medications   Medication Sig Start Date End Date Taking? Authorizing Provider  amLODipine (NORVASC) 5 MG tablet TAKE 1 TABLET(5 MG) BY MOUTH DAILY 10/08/16  Yes Elayne Snare, MD  atorvastatin (LIPITOR) 10 MG tablet TAKE 1 TABLET(10 MG) BY MOUTH DAILY 01/14/17  Yes Elayne Snare, MD  glimepiride (AMARYL) 2 MG tablet TAKE 1 TABLET (2 MG TOTAL) BY MOUTH DAILY WITH  BREAKFAST. 05/12/18  Yes Elayne Snare, MD  Leuprolide Acetate (LUPRON DEPOT, 60-MONTH, IM) Inject into the muscle.   Yes [provider]  metFORMIN (GLUCOPHAGE) 1000 MG tablet TAKE 1 TABLET(1000 MG) BY MOUTH TWICE DAILY WITH A MEAL 10/08/16  Yes Elayne Snare, MD  pioglitazone (ACTOS) 30 MG tablet TAKE 1 TABLET(30 MG) BY MOUTH DAILY 10/08/16  Yes Elayne Snare, MD  ramipril (ALTACE) 5 MG capsule TAKE 1 CAPSULE (10 MG TOTAL) BY MOUTH DAILY. Patient taking differently: TAKE 1 CAPSULE (10 MG TOTAL) BY MOUTH TWICE DAILY. 10/08/16  Yes Elayne Snare, MD  Blood Glucose Monitoring Suppl (ONE TOUCH ULTRA 2) W/DEVICE KIT Use to check blood sugar 3 times per week dx code E11.9 Patient taking differently: Use to check blood sugar 3 times per week dx code E11.9 01/01/15   Elayne Snare, MD  DUREZOL 0.05 % EMUL Place 1 drop into the left eye 3 (three) times daily. 06/12/19   [provider]  glucose blood (ONE TOUCH ULTRA TEST) test strip Use as instructed to check blood sugar 3 times per week dx code E11.9 10/13/16   Elayne Snare, MD  [facility] DELICA LANCETS FINE MISC Use to check blood sugar 3 times per week dx code E11.9 10/08/16   Elayne Snare, MD  PROLENSA 0.07 % SOLN Place 1 drop into the left eye at bedtime. 06/12/19   [provider]  Family History Family History  Problem Relation Age of Onset  . Hypertension Father   . Heart disease Neg Hx   . Diabetes Neg Hx     Social History Social History   Tobacco Use  . Smoking status: Current Every Day Smoker  . Smokeless tobacco: Never Used  Substance Use Topics  . Alcohol use: Not on file  . Drug use: Not on file     Allergies   Effexor xr [venlafaxine hcl]   Review of Systems Review of Systems Pertinent negatives listed in HPI  Physical Exam Triage Vital Signs ED Triage Vitals  Enc Vitals Group     BP 05/05/20 1209 (!) 152/75     Pulse Rate 05/05/20 1209 73     Resp 05/05/20 1209 18     Temp 05/05/20 1209 98.2 F (36.8 C)      Temp Source 05/05/20 1209 Oral     SpO2 05/05/20 1209 96 %     Weight --      Height --      Head Circumference --      Peak Flow --      Pain Score 05/05/20 1238 0     Pain Loc --      Pain Edu? --      Excl. in [place]? --    No data found.  Updated Vital Signs BP (!) 152/75 (BP Location: Left Arm)   Pulse 73   Temp 98.2 F (36.8 C) (Oral)   Resp 18   SpO2 96%   Visual Acuity Right Eye Distance:   Left Eye Distance:   Bilateral Distance:    Right Eye Near:   Left Eye Near:    Bilateral Near:     Physical Exam  General appearance: alert,  cooperative ,no distress, non-ill appearing  Head: Normocephalic, without obvious abnormality, atraumatic ENT: external ears normal, oropharynx normal, congestion present nares Respiratory: CTABL, no adventitious sounds Heart: rate and rhythm normal. No gallop or murmurs noted on exam  Extremities: No gross deformities Skin: Skin color, texture, turgor normal. No rashes seen  Psych: Appropriate mood and affect.  UC Treatments / Results  Labs (all labs ordered are listed, but only abnormal results are displayed) Labs Reviewed - No data to display  EKG   Radiology No results found.  Procedures Procedures (including critical care time)  Medications Ordered in UC Medications - No data to display  Initial Impression / Assessment and Plan / UC Course  I have reviewed the triage vital signs and the nursing notes.  Pertinent labs & imaging results that were available during my care of the patient were reviewed by me and considered in my medical decision making (see chart for details).    Symptoms are likely viral as they have improved. Refilled allergy medication per orders. COVID-19 testing given symptoms and current pandemic. Patient had complaint of a enlarged area on his upper anterior leg, a small non-erythematous nodular type lesion. Appears chronic and superficial -patient has an upcoming visit with PCP advised to  address at that time.No concern for infection or DVT .  If breathing difficulties, chest pain, or severe leg swelling and pain develops go immediately to the ER. Final Clinical Impressions(s) / UC Diagnoses   Final diagnoses:  URI, acute  Localized superficial swelling , right leg  Contact with and (suspected) exposure to covid-19   Discharge Instructions   None    ED Prescriptions    Medication Sig Dispense Auth. Provider  fluticasone (FLONASE) 50 MCG/ACT nasal spray Place 2 sprays into both nostrils daily. 16 g Scot Jun, FNP   cetirizine (ZYRTEC) 10 MG tablet Take 1 tablet (10 mg total) by mouth daily. 30 tablet Scot Jun, FNP   benzonatate (TESSALON) 100 MG capsule Take 1-2 capsules (100-200 mg total) by mouth every 8 (eight) hours. 30 capsule Scot Jun, FNP     PDMP not reviewed this encounter.   Scot Jun, FNP 05/10/20 0005

## 2020-05-05 NOTE — ED Triage Notes (Signed)
2 complaints  Knot on inside of right lower leg, painful for 3 days.  Pain with palpation  Around 8/15 started having stuffy feeling in sinus and chest.  Patient has a cough.   Denies fever Denies runny nose.

## 2020-05-05 NOTE — ED Notes (Signed)
Patient has a list of medications used to treat congestion in the past.  Reports feeling this way this time of year for the past several years  Benzonatate Cetirizine Doxycycline Fluticasone Cough syrup

## 2020-05-08 LAB — NOVEL CORONAVIRUS, NAA: SARS-CoV-2, NAA: NOT DETECTED

## 2020-05-26 ENCOUNTER — Other Ambulatory Visit: Payer: Self-pay

## 2020-05-26 DIAGNOSIS — C61 Malignant neoplasm of prostate: Secondary | ICD-10-CM

## 2020-05-27 ENCOUNTER — Inpatient Hospital Stay: Payer: Medicare PPO | Attending: Oncology

## 2020-05-27 ENCOUNTER — Other Ambulatory Visit: Payer: Self-pay

## 2020-05-27 DIAGNOSIS — C61 Malignant neoplasm of prostate: Secondary | ICD-10-CM | POA: Diagnosis present

## 2020-05-27 DIAGNOSIS — E119 Type 2 diabetes mellitus without complications: Secondary | ICD-10-CM | POA: Diagnosis not present

## 2020-05-27 DIAGNOSIS — Z5111 Encounter for antineoplastic chemotherapy: Secondary | ICD-10-CM | POA: Diagnosis present

## 2020-05-27 DIAGNOSIS — D630 Anemia in neoplastic disease: Secondary | ICD-10-CM | POA: Insufficient documentation

## 2020-05-27 LAB — CBC WITH DIFFERENTIAL (CANCER CENTER ONLY)
Abs Immature Granulocytes: 0.03 10*3/uL (ref 0.00–0.07)
Basophils Absolute: 0.1 10*3/uL (ref 0.0–0.1)
Basophils Relative: 1 %
Eosinophils Absolute: 0.1 10*3/uL (ref 0.0–0.5)
Eosinophils Relative: 1 %
HCT: 35.9 % — ABNORMAL LOW (ref 39.0–52.0)
Hemoglobin: 11.6 g/dL — ABNORMAL LOW (ref 13.0–17.0)
Immature Granulocytes: 0 %
Lymphocytes Relative: 22 %
Lymphs Abs: 1.6 10*3/uL (ref 0.7–4.0)
MCH: 31.9 pg (ref 26.0–34.0)
MCHC: 32.3 g/dL (ref 30.0–36.0)
MCV: 98.6 fL (ref 80.0–100.0)
Monocytes Absolute: 0.7 10*3/uL (ref 0.1–1.0)
Monocytes Relative: 9 %
Neutro Abs: 4.8 10*3/uL (ref 1.7–7.7)
Neutrophils Relative %: 67 %
Platelet Count: 239 10*3/uL (ref 150–400)
RBC: 3.64 MIL/uL — ABNORMAL LOW (ref 4.22–5.81)
RDW: 12.5 % (ref 11.5–15.5)
WBC Count: 7.3 10*3/uL (ref 4.0–10.5)
nRBC: 0 % (ref 0.0–0.2)

## 2020-05-27 LAB — CMP (CANCER CENTER ONLY)
ALT: 14 U/L (ref 0–44)
AST: 16 U/L (ref 15–41)
Albumin: 3.8 g/dL (ref 3.5–5.0)
Alkaline Phosphatase: 64 U/L (ref 38–126)
Anion gap: 5 (ref 5–15)
BUN: 26 mg/dL — ABNORMAL HIGH (ref 8–23)
CO2: 27 mmol/L (ref 22–32)
Calcium: 9.4 mg/dL (ref 8.9–10.3)
Chloride: 107 mmol/L (ref 98–111)
Creatinine: 1.56 mg/dL — ABNORMAL HIGH (ref 0.61–1.24)
GFR, Est AFR Am: 50 mL/min — ABNORMAL LOW (ref >60)
GFR, Estimated: 43 mL/min — ABNORMAL LOW (ref >60)
Glucose, Bld: 104 mg/dL — ABNORMAL HIGH (ref 70–99)
Potassium: 5.2 mmol/L — ABNORMAL HIGH (ref 3.5–5.1)
Sodium: 139 mmol/L (ref 135–145)
Total Bilirubin: 0.4 mg/dL (ref 0.3–1.2)
Total Protein: 6.6 g/dL (ref 6.5–8.1)

## 2020-05-28 LAB — PROSTATE-SPECIFIC AG, SERUM (LABCORP): Prostate Specific Ag, Serum: <0.1 ng/mL (ref 0.0–4.0)

## 2020-05-29 ENCOUNTER — Inpatient Hospital Stay (HOSPITAL_BASED_OUTPATIENT_CLINIC_OR_DEPARTMENT_OTHER): Payer: Medicare PPO | Admitting: Oncology

## 2020-05-29 ENCOUNTER — Other Ambulatory Visit: Payer: Self-pay

## 2020-05-29 ENCOUNTER — Inpatient Hospital Stay: Payer: Medicare PPO

## 2020-05-29 VITALS — BP 143/71 | HR 62 | Temp 97.2°F | Resp 18 | Ht 74.0 in | Wt 220.9 lb

## 2020-05-29 DIAGNOSIS — Z8546 Personal history of malignant neoplasm of prostate: Secondary | ICD-10-CM

## 2020-05-29 DIAGNOSIS — Z5111 Encounter for antineoplastic chemotherapy: Secondary | ICD-10-CM | POA: Diagnosis not present

## 2020-05-29 DIAGNOSIS — C61 Malignant neoplasm of prostate: Secondary | ICD-10-CM

## 2020-05-29 MED ORDER — LEUPROLIDE ACETATE (4 MONTH) 30 MG ~~LOC~~ KIT
30.0000 mg | PACK | Freq: Once | SUBCUTANEOUS | Status: AC
  Administered 2020-05-29: 30 mg via SUBCUTANEOUS

## 2020-05-29 NOTE — Progress Notes (Signed)
Hematology and Oncology Follow Up Visit  Richard [name] [id number] 01-14-1946 74 y.o. 05/29/2020 1:08 PM    Principle Diagnosis: 74 year old man with prostate cancer diagnosed 2006.  He was found to have Gleason score 4+3 = 7 with a PSA of 3.66.  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  Prior Therapy: 1. He is status post retropubic prostatectomy and bilateral lymph node dissection done on 11/25/2004. The pathology showed a Gleason score of 4+4 equals 8 with staging of T2b N0.  2. Sstatus post salvage radiation therapy done between March and May of 2007 for a rise in his PSA to 0.29. He received total of 65 gray in 35 fractions. 3.  He was treated with intermittent Lupron until 2016.  Lupron has been given at that time.  Current therapy:   Eligard 30 mg every 4 months started in April 2016.  He will receive that today and repeated every 4 months.  Interim History: Richard [name] is here for a repeat evaluation.  Since the last visit, he reports no major changes in his health.  He denies any abdominal pain or early satiety.  He denies any recent hospitalization or illnesses.  His performance status quality of life remain excellent.  He denies any bone pain or pathological fractures.           Medications:updated on review.  Current Outpatient Medications on File Prior to Visit  Medication Sig Dispense Refill  . amLODipine (NORVASC) 5 MG tablet TAKE 1 TABLET(5 MG) BY MOUTH DAILY 90 tablet 0  . atorvastatin (LIPITOR) 10 MG tablet TAKE 1 TABLET(10 MG) BY MOUTH DAILY 90 tablet 0  . benzonatate (TESSALON) 100 MG capsule Take 1-2 capsules (100-200 mg total) by mouth every 8 (eight) hours. 30 capsule 0  . Blood Glucose Monitoring Suppl (ONE TOUCH ULTRA 2) W/DEVICE KIT Use to check blood sugar 3 times per week dx code E11.9 (Patient taking differently: Use to check blood sugar 3 times per week dx code E11.9) 1 each 0  . cetirizine (ZYRTEC) 10 MG tablet  Take 1 tablet (10 mg total) by mouth daily. 30 tablet 11  . DUREZOL 0.05 % EMUL Place 1 drop into the left eye 3 (three) times daily.    . fluticasone (FLONASE) 50 MCG/ACT nasal spray Place 2 sprays into both nostrils daily. 16 g 12  . glimepiride (AMARYL) 2 MG tablet TAKE 1 TABLET (2 MG TOTAL) BY MOUTH DAILY WITH BREAKFAST. 90 tablet 0  . glucose blood (ONE TOUCH ULTRA TEST) test strip Use as instructed to check blood sugar 3 times per week dx code E11.9 100 each 1  . Leuprolide Acetate (LUPRON DEPOT, 44-MONTH, IM) Inject into the muscle.    . metFORMIN (GLUCOPHAGE) 1000 MG tablet TAKE 1 TABLET(1000 MG) BY MOUTH TWICE DAILY WITH A MEAL 180 tablet 0  . ONETOUCH DELICA LANCETS FINE MISC Use to check blood sugar 3 times per week dx code E11.9 100 each 3  . pioglitazone (ACTOS) 30 MG tablet TAKE 1 TABLET(30 MG) BY MOUTH DAILY 90 tablet 0  . PROLENSA 0.07 % SOLN Place 1 drop into the left eye at bedtime.    . ramipril (ALTACE) 5 MG capsule TAKE 1 CAPSULE (10 MG TOTAL) BY MOUTH DAILY. (Patient taking differently: TAKE 1 CAPSULE (10 MG TOTAL) BY MOUTH TWICE DAILY.) 90 capsule 2   No current facility-administered medications on file prior to visit.     Allergies:  Allergies  Allergen Reactions  . Effexor Xr Coca Cola  Hcl] Rash      . Physical Exam:       ECOG: 0   General appearance: Comfortable appearing without any discomfort Head: Normocephalic without any trauma Oropharynx: Mucous membranes are moist and pink without any thrush or ulcers. Eyes: Pupils are equal and round reactive to light. Lymph nodes: No cervical, supraclavicular, inguinal or axillary lymphadenopathy.   Heart:regular rate and rhythm.  S1 and S2 without leg edema. Lung: Clear without any rhonchi or wheezes.  No dullness to percussion. Abdomin: Soft, nontender, nondistended with good bowel sounds.  No hepatosplenomegaly. Musculoskeletal: No joint deformity or effusion.  Full range of motion  noted. Neurological: No deficits noted on motor, sensory and deep tendon reflex exam. Skin: No petechial rash or dryness.  Appeared moist.         Lab Results: Lab Results  Component Value Date   WBC 7.3 05/27/2020   HGB 11.6 (L) 05/27/2020   HCT 35.9 (L) 05/27/2020   MCV 98.6 05/27/2020   PLT 239 05/27/2020     Chemistry      Component Value Date/Time   NA 139 05/27/2020 1132   NA 140 05/24/2017 0810   K 5.2 (H) 05/27/2020 1132   K 4.4 05/24/2017 0810   CL 107 05/27/2020 1132   CL 102 02/20/2013 1254   CO2 27 05/27/2020 1132   CO2 25 05/24/2017 0810   BUN 26 (H) 05/27/2020 1132   BUN 24.7 05/24/2017 0810   CREATININE 1.56 (H) 05/27/2020 1132   CREATININE 1.2 05/24/2017 0810      Component Value Date/Time   CALCIUM 9.4 05/27/2020 1132   CALCIUM 9.3 05/24/2017 0810   ALKPHOS 64 05/27/2020 1132   ALKPHOS 60 05/24/2017 0810   AST 16 05/27/2020 1132   AST 14 05/24/2017 0810   ALT 14 05/27/2020 1132   ALT 9 05/24/2017 0810   BILITOT 0.4 05/27/2020 1132   BILITOT 0.41 05/24/2017 0810        Results for Richard [name], Richard "MIKE" (MRN [id number]) as of 05/29/2020 13:10  Ref. Range 01/24/2018 11:04 05/30/2018 08:52 10/03/2018 08:51 02/06/2019 08:09 05/29/2019 10:49 10/02/2019 12:45 01/22/2020 12:44 05/27/2020 11:32  Prostate Specific Ag, Serum Latest Ref Range: 0.0 - 4.0 ng/mL <0.1 <0.1 <0.1 <0.1 <0.1 <0.1 <0.1 <0.1         Impression and Plan:  74 year old man with:   1.prostate cancer diagnosed in 2006.  He has castration-sensitive disease with biochemical relapse only.    He is currently on androgen deprivation therapy alone without any additional treatment.  His PSA continues to be undetectable.  The role of therapy escalation was reiterated and at this time I recommended deferring these treatments unless he develops measurable disease or castration-resistant disease.   2.  Androgen deprivation therapy: Complication associated with this therapy including hot flashes,  weight gain and sexual dysfunction.  He will receive it today and repeated in 4 months.  He is agreeable to proceed at this time.   3. Diabetes mellitus: No recent exacerbation noted at this time.  4.  Anemia: Related to chronic disease and malignancy.  His hemoglobin continues to be normal.  5.  Covid vaccination considerations: I recommended obtaining his booster which he is willing and planning to immediate future.  6. Followup: He will return in 4 months for repeat follow-up.  30 minutes were dedicated to this encounter.  The time was spent on reviewing his disease status, discussing treatment options and outlining future plan of care.    Roxy Cedar  Alen Blew, MD 9/30/20211:08 PM

## 2020-08-04 ENCOUNTER — Other Ambulatory Visit: Payer: Self-pay

## 2020-08-04 ENCOUNTER — Other Ambulatory Visit (INDEPENDENT_AMBULATORY_CARE_PROVIDER_SITE_OTHER): Payer: Medicare PPO

## 2020-08-04 DIAGNOSIS — E782 Mixed hyperlipidemia: Secondary | ICD-10-CM | POA: Diagnosis not present

## 2020-08-04 DIAGNOSIS — E119 Type 2 diabetes mellitus without complications: Secondary | ICD-10-CM | POA: Diagnosis not present

## 2020-08-04 LAB — COMPREHENSIVE METABOLIC PANEL
ALT: 11 U/L (ref 0–53)
AST: 15 U/L (ref 0–37)
Albumin: 3.9 g/dL (ref 3.5–5.2)
Alkaline Phosphatase: 56 U/L (ref 39–117)
BUN: 30 mg/dL — ABNORMAL HIGH (ref 6–23)
CO2: 28 mEq/L (ref 19–32)
Calcium: 9.4 mg/dL (ref 8.4–10.5)
Chloride: 104 mEq/L (ref 96–112)
Creatinine, Ser: 1.49 mg/dL (ref 0.40–1.50)
GFR: 45.61 mL/min — ABNORMAL LOW (ref >60.00)
Glucose, Bld: 98 mg/dL (ref 70–99)
Potassium: 5 mEq/L (ref 3.5–5.1)
Sodium: 139 mEq/L (ref 135–145)
Total Bilirubin: 0.4 mg/dL (ref 0.2–1.2)
Total Protein: 6.5 g/dL (ref 6.0–8.3)

## 2020-08-04 LAB — LIPID PANEL
Cholesterol: 165 mg/dL (ref 0–200)
HDL: 66.7 mg/dL (ref >39.00)
LDL Cholesterol: 73 mg/dL (ref 0–99)
NonHDL: 98.52
Total CHOL/HDL Ratio: 2
Triglycerides: 127 mg/dL (ref 0.0–149.0)
VLDL: 25.4 mg/dL (ref 0.0–40.0)

## 2020-08-04 LAB — MICROALBUMIN / CREATININE URINE RATIO
Creatinine,U: 220 mg/dL
Microalb Creat Ratio: 7.9 mg/g (ref 0.0–30.0)
Microalb, Ur: 17.4 mg/dL — ABNORMAL HIGH (ref 0.0–1.9)

## 2020-08-04 LAB — HEMOGLOBIN A1C: Hgb A1c MFr Bld: 6 % (ref 4.6–6.5)

## 2020-08-06 NOTE — Progress Notes (Signed)
Patient ID: Richard [name], male   DOB: 1946-04-20, 74 y.o.   MRN: [id number]    Reason for Appointment:  Follow-up visit  History of Present Illness   Problem 1: HYPERTENSION: See review of systems for today's notes  DIABETES MELITUS, date of diagnosis 1995 with a glucose of 320 and HemoglobinA1c of 9.6 Previous history: He has been on various oral hypoglycemic drugs in the past but for the last few years has required multiple drugs in combination; diabetes has been generally well controlled with A1c near normal  Recent history:   Oral hypoglycemic drugs: Amaryl 2 mg once a day in a.m., metformin 1000 twice a day, Actos 30 mg daily  His A1c last was 6 and is the same now   Current management, blood sugar patterns and problems identified:  He has checked some blood sugars about about 1 time a week  Did not bring his monitor  He is likely checking only fasting readings  He feels that his blood sugars are very consistently right around 100 and not over 113  Fasting lab glucose 98  Previously utilized breakfast and not as much at lunch and dinner  He does try to exercise with either elliptical or walking with hunting  No edema with Actos  Weight is about the same  He has been on the same 3 drug regimen for quite some time  He has not ever felt any symptoms of hypoglycemia   Side effects from medications: None       Monitors blood glucose:  irregularly     Glucometer: One Touch ultra 2 .          Home blood Glucose readings: As above  Dietician visit: Most recent:? 2007    Weight control:      Wt Readings from Last 3 Encounters:  08/07/20 217 lb 6.4 oz (98.6 kg)  05/29/20 220 lb 14.4 oz (100.2 kg)  02/01/20 219 lb 9.6 oz (99.6 kg)    Diabetes labs:  Lab Results  Component Value Date   HGBA1C 6.0 08/04/2020   HGBA1C 6.0 11/12/2019   HGBA1C 6.0 04/30/2019   Lab Results  Component Value Date   MICROALBUR 17.4 (H) 08/04/2020   LDLCALC 73  08/04/2020   CREATININE 1.49 08/04/2020    Other problems addressed today: See review of systems   Lab on 08/04/2020  Component Date Value Ref Range Status  . Microalb, Ur 08/04/2020 17.4* 0.0 - 1.9 mg/dL Final  . Creatinine,U 08/04/2020 220.0  mg/dL Final  . Microalb Creat Ratio 08/04/2020 7.9  0.0 - 30.0 mg/g Final  . Cholesterol 08/04/2020 165  0 - 200 mg/dL Final   ATP III Classification       Desirable:  < 200 mg/dL               Borderline High:  200 - 239 mg/dL          High:  > = 240 mg/dL  . Triglycerides 08/04/2020 127.0  0.0 - 149.0 mg/dL Final   Normal:  <150 mg/dLBorderline High:  150 - 199 mg/dL  . HDL 08/04/2020 66.70  >39.00 mg/dL Final  . VLDL 08/04/2020 25.4  0.0 - 40.0 mg/dL Final  . LDL Cholesterol 08/04/2020 73  0 - 99 mg/dL Final  . Total CHOL/HDL Ratio 08/04/2020 2   Final                  Men  Women1/2 Average Risk     3.4          3.3Average Risk          5.0          4.42X Average Risk          9.6          7.13X Average Risk          15.0          11.0                      . NonHDL 08/04/2020 98.52   Final   NOTE:  Non-HDL goal should be 30 mg/dL higher than patient's LDL goal (i.e. LDL goal of < 70 mg/dL, would have non-HDL goal of < 100 mg/dL)  . Sodium 08/04/2020 139  135 - 145 mEq/L Final  . Potassium 08/04/2020 5.0  3.5 - 5.1 mEq/L Final  . Chloride 08/04/2020 104  96 - 112 mEq/L Final  . CO2 08/04/2020 28  19 - 32 mEq/L Final  . Glucose, Bld 08/04/2020 98  70 - 99 mg/dL Final  . BUN 08/04/2020 30* 6 - 23 mg/dL Final  . Creatinine, Ser 08/04/2020 1.49  0.40 - 1.50 mg/dL Final  . Total Bilirubin 08/04/2020 0.4  0.2 - 1.2 mg/dL Final  . Alkaline Phosphatase 08/04/2020 56  39 - 117 U/L Final  . AST 08/04/2020 15  0 - 37 U/L Final  . ALT 08/04/2020 11  0 - 53 U/L Final  . Total Protein 08/04/2020 6.5  6.0 - 8.3 g/dL Final  . Albumin 08/04/2020 3.9  3.5 - 5.2 g/dL Final  . GFR 08/04/2020 45.61* >60.00 mL/min Final   Calculated using the CKD-EPI  Creatinine Equation (2021)  . Calcium 08/04/2020 9.4  8.4 - 10.5 mg/dL Final  . Hgb A1c MFr Bld 08/04/2020 6.0  4.6 - 6.5 % Final   Glycemic Control Guidelines for People with Diabetes:Non Diabetic:  <6%Goal of Therapy: <7%Additional Action Suggested:  >8%      Allergies as of 08/07/2020      Reactions   Effexor Xr [venlafaxine Hcl] Rash      Medication List       Accurate as of August 07, 2020  8:22 AM. If you have any questions, ask your nurse or doctor.        amLODipine 5 MG tablet Commonly known as: NORVASC TAKE 1 TABLET(5 MG) BY MOUTH DAILY   atorvastatin 10 MG tablet Commonly known as: LIPITOR TAKE 1 TABLET(10 MG) BY MOUTH DAILY   benzonatate 100 MG capsule Commonly known as: TESSALON Take 1-2 capsules (100-200 mg total) by mouth every 8 (eight) hours.   cetirizine 10 MG tablet Commonly known as: ZYRTEC Take 1 tablet (10 mg total) by mouth daily.   Durezol 0.05 % Emul Generic drug: Difluprednate Place 1 drop into the left eye 3 (three) times daily.   fluticasone 50 MCG/ACT nasal spray Commonly known as: FLONASE Place 2 sprays into both nostrils daily.   glimepiride 2 MG tablet Commonly known as: AMARYL TAKE 1 TABLET (2 MG TOTAL) BY MOUTH DAILY WITH BREAKFAST.   glucose blood test strip Commonly known as: ONE TOUCH ULTRA TEST Use as instructed to check blood sugar 3 times per week dx code E11.9   LUPRON DEPOT (39-MONTH) IM Inject into the muscle.   metFORMIN 1000 MG tablet Commonly known as: GLUCOPHAGE TAKE 1 TABLET(1000 MG) BY MOUTH TWICE DAILY WITH A  MEAL   ONE TOUCH ULTRA 2 w/Device Kit Use to check blood sugar 3 times per week dx code Z61.0   OneTouch Delica Lancets Fine Misc Use to check blood sugar 3 times per week dx code E11.9   pioglitazone 30 MG tablet Commonly known as: ACTOS TAKE 1 TABLET(30 MG) BY MOUTH DAILY   Prolensa 0.07 % Soln Generic drug: Bromfenac Sodium Place 1 drop into the left eye at bedtime.   ramipril 5 MG  capsule Commonly known as: ALTACE TAKE 1 CAPSULE (10 MG TOTAL) BY MOUTH DAILY. What changed: additional instructions       Allergies:  Allergies  Allergen Reactions  . Effexor Xr [Venlafaxine Hcl] Rash    Past Medical History:  Diagnosis Date  . Diabetes mellitus without complication ([place])   . Hypertension   . prostate ca dx'd 2006   prostatectomy and xrt    No past surgical history on file.  Family History  Problem Relation Age of Onset  . Hypertension Father   . Heart disease Neg Hx   . Diabetes Neg Hx     Social History:  reports that he has been smoking. He has never used smokeless tobacco. No history on file for alcohol use and drug use.    REVIEW OF SYSTEMS:       Hypertension:  blood pressure has been  well controlled with 5 mg ramipril, amlodipine 5 mg  Not monitoring blood pressure at home even though he has a meter at home   BP Readings from Last 3 Encounters:  08/07/20 120/68  05/29/20 (!) 143/71  05/05/20 (!) 152/75   Has slightly variable but high creatinine values for some time Creatinine is slightly better Tends to have high normal potassium levels  Lab Results  Component Value Date   CREATININE 1.49 08/04/2020   CREATININE 1.56 (H) 05/27/2020   CREATININE 1.43 01/30/2020   Lab Results  Component Value Date   K 5.0 08/04/2020     Lipids:  he has been treated with Lipitor 10 mg which he takes daily LDL controlled as well as triglycerides   Lab Results  Component Value Date   CHOL 165 08/04/2020   HDL 66.70 08/04/2020   LDLCALC 73 08/04/2020   TRIG 127.0 08/04/2020   CHOLHDL 2 08/04/2020     History of vitamin B12 deficiency ,he is taking this regularly Has mild chronic anemia B12 level is normal  CBC followed by oncologist    Last eye exam was in 8/20  He has had no symptoms of burning or tingling in his toes  LABS:  Lab on 08/04/2020  Component Date Value Ref Range Status  . Microalb, Ur 08/04/2020 17.4* 0.0 - 1.9  mg/dL Final  . Creatinine,U 08/04/2020 220.0  mg/dL Final  . Microalb Creat Ratio 08/04/2020 7.9  0.0 - 30.0 mg/g Final  . Cholesterol 08/04/2020 165  0 - 200 mg/dL Final   ATP III Classification       Desirable:  < 200 mg/dL               Borderline High:  200 - 239 mg/dL          High:  > = 240 mg/dL  . Triglycerides 08/04/2020 127.0  0.0 - 149.0 mg/dL Final   Normal:  <150 mg/dLBorderline High:  150 - 199 mg/dL  . HDL 08/04/2020 66.70  >39.00 mg/dL Final  . VLDL 08/04/2020 25.4  0.0 - 40.0 mg/dL Final  . LDL Cholesterol 08/04/2020 73  0 - 99 mg/dL Final  . Total CHOL/HDL Ratio 08/04/2020 2   Final                  Men          Women1/2 Average Risk     3.4          3.3Average Risk          5.0          4.42X Average Risk          9.6          7.13X Average Risk          15.0          11.0                      . NonHDL 08/04/2020 98.52   Final   NOTE:  Non-HDL goal should be 30 mg/dL higher than patient's LDL goal (i.e. LDL goal of < 70 mg/dL, would have non-HDL goal of < 100 mg/dL)  . Sodium 08/04/2020 139  135 - 145 mEq/L Final  . Potassium 08/04/2020 5.0  3.5 - 5.1 mEq/L Final  . Chloride 08/04/2020 104  96 - 112 mEq/L Final  . CO2 08/04/2020 28  19 - 32 mEq/L Final  . Glucose, Bld 08/04/2020 98  70 - 99 mg/dL Final  . BUN 87/51/6061 30* 6 - 23 mg/dL Final  . Creatinine, Ser 08/04/2020 1.49  0.40 - 1.50 mg/dL Final  . Total Bilirubin 08/04/2020 0.4  0.2 - 1.2 mg/dL Final  . Alkaline Phosphatase 08/04/2020 56  39 - 117 U/L Final  . AST 08/04/2020 15  0 - 37 U/L Final  . ALT 08/04/2020 11  0 - 53 U/L Final  . Total Protein 08/04/2020 6.5  6.0 - 8.3 g/dL Final  . Albumin 07/74/2178 3.9  3.5 - 5.2 g/dL Final  . GFR 27/49/7680 45.61* >60.00 mL/min Final   Calculated using the CKD-EPI Creatinine Equation (2021)  . Calcium 08/04/2020 9.4  8.4 - 10.5 mg/dL Final  . Hgb S6Z MFr Bld 08/04/2020 6.0  4.6 - 6.5 % Final   Glycemic Control Guidelines for People with Diabetes:Non Diabetic:   <6%Goal of Therapy: <7%Additional Action Suggested:  >8%      Examination:   BP 120/68   Pulse 68   Ht 5' 10.5" (1.791 m)   Wt 217 lb 6.4 oz (98.6 kg)   SpO2 93%   BMI 30.75 kg/m   Body mass index is 30.75 kg/m.     ASSESSMENT/ PLAN:    Diabetes type 2 with mild obesity:    He is on Actos, Amaryl and Metformin  Blood sugars are excellent with A1c consistently at 6% He is not monitoring much at home but does not appear to have much fluctuation in his blood sugars No side effects or hypoglycemia and he can continue the same doses  Hypertension: Blood pressure excellent and he will continue regimen of Norvasc and ramipril  Since he tends to have high normal potassium advised him to avoid high potassium foods such as bananas, oranges and baked potatoes No microalbuminuria  We will continue ramipril 5 mg and Norvasc for now  Reminded him to check blood pressure periodically unless having it checked at another physician offices  LIPIDS: Well-controlled on atorvastatin  Follow-up in 6 months  Arnisha Laffoon Lucianne Muss 08/07/2020, 8:22 AM     Note: This office note was prepared with Dragon voice  recognition system technology. Any transcriptional errors that result from this process are unintentional.

## 2020-08-07 ENCOUNTER — Ambulatory Visit: Payer: Medicare PPO | Admitting: Endocrinology

## 2020-08-07 ENCOUNTER — Other Ambulatory Visit: Payer: Self-pay

## 2020-08-07 VITALS — BP 120/68 | HR 68 | Ht 70.5 in | Wt 217.4 lb

## 2020-08-07 DIAGNOSIS — E782 Mixed hyperlipidemia: Secondary | ICD-10-CM | POA: Diagnosis not present

## 2020-08-07 DIAGNOSIS — E119 Type 2 diabetes mellitus without complications: Secondary | ICD-10-CM | POA: Diagnosis not present

## 2020-08-07 DIAGNOSIS — I10 Essential (primary) hypertension: Secondary | ICD-10-CM

## 2020-08-07 DIAGNOSIS — N289 Disorder of kidney and ureter, unspecified: Secondary | ICD-10-CM | POA: Diagnosis not present

## 2020-08-07 NOTE — Patient Instructions (Signed)
Check blood sugars on waking up 1-2  days a week  Also check blood sugars about 2 hours after meals and do this after different meals by rotation  Recommended blood sugar levels on waking up are 90-130 and about 2 hours after meal is 130-160  Please bring your blood sugar monitor to each visit, thank you  

## 2020-10-02 ENCOUNTER — Inpatient Hospital Stay: Payer: Medicare PPO | Attending: Oncology

## 2020-10-02 ENCOUNTER — Other Ambulatory Visit: Payer: Self-pay

## 2020-10-02 DIAGNOSIS — C61 Malignant neoplasm of prostate: Secondary | ICD-10-CM | POA: Insufficient documentation

## 2020-10-02 DIAGNOSIS — D649 Anemia, unspecified: Secondary | ICD-10-CM | POA: Insufficient documentation

## 2020-10-02 DIAGNOSIS — Z5111 Encounter for antineoplastic chemotherapy: Secondary | ICD-10-CM | POA: Diagnosis not present

## 2020-10-02 DIAGNOSIS — E119 Type 2 diabetes mellitus without complications: Secondary | ICD-10-CM | POA: Insufficient documentation

## 2020-10-02 LAB — CBC WITH DIFFERENTIAL (CANCER CENTER ONLY)
Abs Immature Granulocytes: 0.02 10*3/uL (ref 0.00–0.07)
Basophils Absolute: 0.1 10*3/uL (ref 0.0–0.1)
Basophils Relative: 1 %
Eosinophils Absolute: 0.1 10*3/uL (ref 0.0–0.5)
Eosinophils Relative: 2 %
HCT: 36.2 % — ABNORMAL LOW (ref 39.0–52.0)
Hemoglobin: 11.5 g/dL — ABNORMAL LOW (ref 13.0–17.0)
Immature Granulocytes: 0 %
Lymphocytes Relative: 29 %
Lymphs Abs: 1.9 10*3/uL (ref 0.7–4.0)
MCH: 31.4 pg (ref 26.0–34.0)
MCHC: 31.8 g/dL (ref 30.0–36.0)
MCV: 98.9 fL (ref 80.0–100.0)
Monocytes Absolute: 0.7 10*3/uL (ref 0.1–1.0)
Monocytes Relative: 12 %
Neutro Abs: 3.6 10*3/uL (ref 1.7–7.7)
Neutrophils Relative %: 56 %
Platelet Count: 224 10*3/uL (ref 150–400)
RBC: 3.66 MIL/uL — ABNORMAL LOW (ref 4.22–5.81)
RDW: 12.3 % (ref 11.5–15.5)
WBC Count: 6.4 10*3/uL (ref 4.0–10.5)
nRBC: 0 % (ref 0.0–0.2)

## 2020-10-02 LAB — CMP (CANCER CENTER ONLY)
ALT: 12 U/L (ref 0–44)
AST: 15 U/L (ref 15–41)
Albumin: 4 g/dL (ref 3.5–5.0)
Alkaline Phosphatase: 61 U/L (ref 38–126)
Anion gap: 7 (ref 5–15)
BUN: 24 mg/dL — ABNORMAL HIGH (ref 8–23)
CO2: 27 mmol/L (ref 22–32)
Calcium: 9.3 mg/dL (ref 8.9–10.3)
Chloride: 106 mmol/L (ref 98–111)
Creatinine: 1.43 mg/dL — ABNORMAL HIGH (ref 0.61–1.24)
GFR, Estimated: 51 mL/min — ABNORMAL LOW (ref >60)
Glucose, Bld: 89 mg/dL (ref 70–99)
Potassium: 4.6 mmol/L (ref 3.5–5.1)
Sodium: 140 mmol/L (ref 135–145)
Total Bilirubin: 0.5 mg/dL (ref 0.3–1.2)
Total Protein: 6.6 g/dL (ref 6.5–8.1)

## 2020-10-03 LAB — PROSTATE-SPECIFIC AG, SERUM (LABCORP): Prostate Specific Ag, Serum: <0.1 ng/mL (ref 0.0–4.0)

## 2020-10-09 ENCOUNTER — Inpatient Hospital Stay: Payer: Medicare PPO

## 2020-10-09 ENCOUNTER — Telehealth: Payer: Self-pay | Admitting: Oncology

## 2020-10-09 ENCOUNTER — Inpatient Hospital Stay: Payer: Medicare PPO | Admitting: Oncology

## 2020-10-09 ENCOUNTER — Other Ambulatory Visit: Payer: Self-pay

## 2020-10-09 VITALS — BP 151/93 | HR 82 | Temp 97.8°F | Resp 18 | Ht 70.0 in | Wt 219.5 lb

## 2020-10-09 DIAGNOSIS — Z8546 Personal history of malignant neoplasm of prostate: Secondary | ICD-10-CM

## 2020-10-09 DIAGNOSIS — C61 Malignant neoplasm of prostate: Secondary | ICD-10-CM | POA: Diagnosis not present

## 2020-10-09 DIAGNOSIS — Z5111 Encounter for antineoplastic chemotherapy: Secondary | ICD-10-CM | POA: Diagnosis not present

## 2020-10-09 MED ORDER — LEUPROLIDE ACETATE (4 MONTH) 30 MG ~~LOC~~ KIT
30.0000 mg | PACK | Freq: Once | SUBCUTANEOUS | Status: AC
  Administered 2020-10-09: 30 mg via SUBCUTANEOUS

## 2020-10-09 MED ORDER — LEUPROLIDE ACETATE (4 MONTH) 30 MG ~~LOC~~ KIT
PACK | SUBCUTANEOUS | Status: AC
  Filled 2020-10-09: qty 30

## 2020-10-09 NOTE — Patient Instructions (Signed)

## 2020-10-09 NOTE — Telephone Encounter (Signed)
Scheduled appointments per 2/10 los. Spoke to patient who is aware of appointments date and times.

## 2020-10-09 NOTE — Progress Notes (Signed)
Hematology and Oncology Follow Up Visit  Richard [name] [id number] 08-12-1946 75 y.o. 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  He was initially diagnosed 2006 with Gleason score 4+3 = 7 with a PSA of 3.66.    Prior Therapy: 1. He is status post retropubic prostatectomy and bilateral lymph node dissection done on 11/25/2004. The pathology showed a Gleason score of 4+4 equals 8 with staging of T2b N0.  2. Sstatus post salvage radiation therapy done between March and May of 2007 for a rise in his PSA to 0.29. He received total of 65 gray in 35 fractions. 3.  He was treated with intermittent Lupron until 2016.  Lupron has been given at that time.  Current therapy:   Eligard 30 mg every 4 months started in April 2016.  He is scheduled to receive that today.  Interim History: Richard [name] presents today for a follow-up visit.  Since the last visit, he reports no major changes in his health.  He has tolerated Eligard without any complications.  He denies any hot flashes or excessive weight gain or fatigue.  He denies any bone pain or pathological fractures.  He denies any recent hospitalization or illnesses.           Medications: Unchanged on review.  Current Outpatient Medications on File Prior to Visit  Medication Sig Dispense Refill  . amLODipine (NORVASC) 5 MG tablet TAKE 1 TABLET(5 MG) BY MOUTH DAILY 90 tablet 0  . atorvastatin (LIPITOR) 10 MG tablet TAKE 1 TABLET(10 MG) BY MOUTH DAILY 90 tablet 0  . benzonatate (TESSALON) 100 MG capsule Take 1-2 capsules (100-200 mg total) by mouth every 8 (eight) hours. (Patient not taking: Reported on 08/07/2020) 30 capsule 0  . Blood Glucose Monitoring Suppl (ONE TOUCH ULTRA 2) W/DEVICE KIT Use to check blood sugar 3 times per week dx code E11.9 (Patient taking differently: Use to check blood sugar 3 times per week dx code E11.9) 1 each 0  .  cetirizine (ZYRTEC) 10 MG tablet Take 1 tablet (10 mg total) by mouth daily. 30 tablet 11  . DUREZOL 0.05 % EMUL Place 1 drop into the left eye 3 (three) times daily. (Patient not taking: Reported on 08/07/2020)    . fluticasone (FLONASE) 50 MCG/ACT nasal spray Place 2 sprays into both nostrils daily. 16 g 12  . glimepiride (AMARYL) 2 MG tablet TAKE 1 TABLET (2 MG TOTAL) BY MOUTH DAILY WITH BREAKFAST. 90 tablet 0  . glucose blood (ONE TOUCH ULTRA TEST) test strip Use as instructed to check blood sugar 3 times per week dx code E11.9 100 each 1  . Leuprolide Acetate (LUPRON DEPOT, 52-MONTH, IM) Inject into the muscle.    . metFORMIN (GLUCOPHAGE) 1000 MG tablet TAKE 1 TABLET(1000 MG) BY MOUTH TWICE DAILY WITH A MEAL 180 tablet 0  . ONETOUCH DELICA LANCETS FINE MISC Use to check blood sugar 3 times per week dx code E11.9 100 each 3  . pioglitazone (ACTOS) 30 MG tablet TAKE 1 TABLET(30 MG) BY MOUTH DAILY 90 tablet 0  . PROLENSA 0.07 % SOLN Place 1 drop into the left eye at bedtime. (Patient not taking: Reported on 08/07/2020)    . ramipril (ALTACE) 5 MG capsule TAKE 1 CAPSULE (10 MG TOTAL) BY MOUTH DAILY. (Patient taking differently: TAKE 1 CAPSULE (10 MG TOTAL) BY MOUTH TWICE DAILY.) 90 capsule 2   No current facility-administered medications on file prior  to visit.     Allergies:  Allergies  Allergen Reactions  . Effexor Xr [Venlafaxine Hcl] Rash      . Physical Exam:  Blood pressure (!) 151/93, pulse 82, temperature 97.8 F (36.6 C), temperature source Tympanic, resp. rate 18, height '5\' 10"'  (1.778 m), weight 219 lb 8 oz (99.6 kg), SpO2 99 %.       ECOG: 0    General appearance: Alert, awake without any distress. Head: Atraumatic without abnormalities Oropharynx: Without any thrush or ulcers. Eyes: No scleral icterus. Lymph nodes: No lymphadenopathy noted in the cervical, supraclavicular, or axillary nodes Heart:regular rate and rhythm, without any murmurs or gallops.   Lung:  Clear to auscultation without any rhonchi, wheezes or dullness to percussion. Abdomin: Soft, nontender without any shifting dullness or ascites. Musculoskeletal: No clubbing or cyanosis. Neurological: No motor or sensory deficits. Skin: No rashes or lesions.         Lab Results: Lab Results  Component Value Date   WBC 6.4 10/02/2020   HGB 11.5 (L) 10/02/2020   HCT 36.2 (L) 10/02/2020   MCV 98.9 10/02/2020   PLT 224 10/02/2020     Chemistry      Component Value Date/Time   NA 140 10/02/2020 0926   NA 140 05/24/2017 0810   K 4.6 10/02/2020 0926   K 4.4 05/24/2017 0810   CL 106 10/02/2020 0926   CL 102 02/20/2013 1254   CO2 27 10/02/2020 0926   CO2 25 05/24/2017 0810   BUN 24 (H) 10/02/2020 0926   BUN 24.7 05/24/2017 0810   CREATININE 1.43 (H) 10/02/2020 0926   CREATININE 1.2 05/24/2017 0810      Component Value Date/Time   CALCIUM 9.3 10/02/2020 0926   CALCIUM 9.3 05/24/2017 0810   ALKPHOS 61 10/02/2020 0926   ALKPHOS 60 05/24/2017 0810   AST 15 10/02/2020 0926   AST 14 05/24/2017 0810   ALT 12 10/02/2020 0926   ALT 9 05/24/2017 0810   BILITOT 0.5 10/02/2020 0926   BILITOT 0.41 05/24/2017 0810      Results for Richard Quince M "Richard [name]" (MRN [id number]) as of 10/09/2020 10:12  Ref. Range 10/02/2020 09:26  Prostate Specific Ag, Serum Latest Ref Range: 0.0 - 4.0 ng/mL <0.1            Impression and Plan:  75 year old man with:   1. 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     The natural course of his disease was discussed at this time and treatment options were reviewed.  His PSA continues to be undetectable without any major complications.  He is currently on androgen deprivation therapy along and the role for therapy escalation were discussed.  These would include Zytiga, Xtandi, systemic chemotherapy among other options.  At this time, I recommended continued androgen deprivation therapy alone and repeat imaging  studies with PSMA PET scan to detect any oligoprogression.  Therapy escalation and potentially directed therapy could be implemented at that time.  He is agreeable to continue with the current plan.   2.  Androgen deprivation therapy: He has tolerated reasonably well.  Long-term complication occluding weight gain, hot flashes, sexual dysfunction and osteoporosis were reviewed.  He is agreeable to receive it today.   3. Diabetes mellitus: Managed by his primary care physician without any exacerbation on androgen deprivation.  4.  Anemia: Remains of mild overall.  This is related to malignancy and renal insufficiency.  5.  Covid vaccination considerations: He is up-to-date and  completed booster.  6. Followup: In 4 months for repeat follow-up.  30 minutes were spent on this visit.  The time was dedicated to reviewing laboratory data, disease status update and treatment options for the future.    Zola Button, MD 2/10/202210:10 AM

## 2021-02-05 ENCOUNTER — Other Ambulatory Visit: Payer: Self-pay

## 2021-02-05 ENCOUNTER — Inpatient Hospital Stay: Payer: Medicare PPO | Attending: Oncology

## 2021-02-05 DIAGNOSIS — E119 Type 2 diabetes mellitus without complications: Secondary | ICD-10-CM | POA: Diagnosis not present

## 2021-02-05 DIAGNOSIS — Z5111 Encounter for antineoplastic chemotherapy: Secondary | ICD-10-CM | POA: Insufficient documentation

## 2021-02-05 DIAGNOSIS — D630 Anemia in neoplastic disease: Secondary | ICD-10-CM | POA: Insufficient documentation

## 2021-02-05 DIAGNOSIS — C61 Malignant neoplasm of prostate: Secondary | ICD-10-CM | POA: Diagnosis present

## 2021-02-05 LAB — CMP (CANCER CENTER ONLY)
ALT: 12 U/L (ref 0–44)
AST: 17 U/L (ref 15–41)
Albumin: 3.9 g/dL (ref 3.5–5.0)
Alkaline Phosphatase: 68 U/L (ref 38–126)
Anion gap: 9 (ref 5–15)
BUN: 22 mg/dL (ref 8–23)
CO2: 26 mmol/L (ref 22–32)
Calcium: 9.4 mg/dL (ref 8.9–10.3)
Chloride: 106 mmol/L (ref 98–111)
Creatinine: 1.49 mg/dL — ABNORMAL HIGH (ref 0.61–1.24)
GFR, Estimated: 48 mL/min — ABNORMAL LOW (ref >60)
Glucose, Bld: 93 mg/dL (ref 70–99)
Potassium: 5.3 mmol/L — ABNORMAL HIGH (ref 3.5–5.1)
Sodium: 141 mmol/L (ref 135–145)
Total Bilirubin: 0.4 mg/dL (ref 0.3–1.2)
Total Protein: 6.8 g/dL (ref 6.5–8.1)

## 2021-02-05 LAB — CBC WITH DIFFERENTIAL (CANCER CENTER ONLY)
Abs Immature Granulocytes: 0.01 10*3/uL (ref 0.00–0.07)
Basophils Absolute: 0.1 10*3/uL (ref 0.0–0.1)
Basophils Relative: 1 %
Eosinophils Absolute: 0.1 10*3/uL (ref 0.0–0.5)
Eosinophils Relative: 2 %
HCT: 36.7 % — ABNORMAL LOW (ref 39.0–52.0)
Hemoglobin: 11.9 g/dL — ABNORMAL LOW (ref 13.0–17.0)
Immature Granulocytes: 0 %
Lymphocytes Relative: 33 %
Lymphs Abs: 2.2 10*3/uL (ref 0.7–4.0)
MCH: 31.8 pg (ref 26.0–34.0)
MCHC: 32.4 g/dL (ref 30.0–36.0)
MCV: 98.1 fL (ref 80.0–100.0)
Monocytes Absolute: 0.8 10*3/uL (ref 0.1–1.0)
Monocytes Relative: 11 %
Neutro Abs: 3.6 10*3/uL (ref 1.7–7.7)
Neutrophils Relative %: 53 %
Platelet Count: 227 10*3/uL (ref 150–400)
RBC: 3.74 MIL/uL — ABNORMAL LOW (ref 4.22–5.81)
RDW: 12.6 % (ref 11.5–15.5)
WBC Count: 6.8 10*3/uL (ref 4.0–10.5)
nRBC: 0 % (ref 0.0–0.2)

## 2021-02-06 LAB — PROSTATE-SPECIFIC AG, SERUM (LABCORP): Prostate Specific Ag, Serum: <0.1 ng/mL (ref 0.0–4.0)

## 2021-02-12 ENCOUNTER — Other Ambulatory Visit: Payer: Self-pay

## 2021-02-12 ENCOUNTER — Inpatient Hospital Stay (HOSPITAL_BASED_OUTPATIENT_CLINIC_OR_DEPARTMENT_OTHER): Payer: Medicare PPO | Admitting: Oncology

## 2021-02-12 ENCOUNTER — Inpatient Hospital Stay: Payer: Medicare PPO

## 2021-02-12 VITALS — BP 146/76 | HR 67 | Temp 98.2°F | Resp 18

## 2021-02-12 VITALS — BP 153/73 | HR 90 | Temp 98.1°F | Resp 18 | Ht 70.0 in | Wt 214.1 lb

## 2021-02-12 DIAGNOSIS — Z8546 Personal history of malignant neoplasm of prostate: Secondary | ICD-10-CM | POA: Diagnosis not present

## 2021-02-12 DIAGNOSIS — Z5111 Encounter for antineoplastic chemotherapy: Secondary | ICD-10-CM | POA: Diagnosis not present

## 2021-02-12 MED ORDER — LEUPROLIDE ACETATE (4 MONTH) 30 MG ~~LOC~~ KIT
PACK | SUBCUTANEOUS | Status: AC
  Filled 2021-02-12: qty 30

## 2021-02-12 MED ORDER — LEUPROLIDE ACETATE (4 MONTH) 30 MG ~~LOC~~ KIT
30.0000 mg | PACK | Freq: Once | SUBCUTANEOUS | Status: AC
  Administered 2021-02-12: 11:00:00 30 mg via SUBCUTANEOUS

## 2021-02-12 NOTE — Patient Instructions (Addendum)
Leuprolide depot injection What is this medication? LEUPROLIDE (loo PROE lide) is a man-made protein that acts like a natural hormone in the body. It decreases testosterone in men and decreases estrogen in women. In men, this medicine is used to treat advanced prostate cancer. In women, some forms of this medicine may be used to treat endometriosis, uterinefibroids, or other male hormone-related problems. This medicine may be used for other purposes; ask your health care provider orpharmacist if you have questions. COMMON BRAND NAME(S): Eligard, Fensolv, Lupron Depot, Lupron Depot-Ped, Viadur What should I tell my care team before I take this medication? They need to know if you have any of these conditions: diabetes heart disease or previous heart attack high blood pressure high cholesterol mental illness osteoporosis pain or difficulty passing urine seizures spinal cord metastasis stroke suicidal thoughts, plans, or attempt; a previous suicide attempt by you or a family member tobacco smoker unusual vaginal bleeding (women) an unusual or allergic reaction to leuprolide, benzyl alcohol, other medicines, foods, dyes, or preservatives pregnant or trying to get pregnant breast-feeding How should I use this medication? This medicine is for injection into a muscle or for injection under the skin. It is given by a health care professional in a hospital or clinic setting. The specific product will determine how it will be given to you. Make sure youunderstand which product you receive and how often you will receive it. Talk to your pediatrician regarding the use of this medicine in children.Special care may be needed. Overdosage: If you think you have taken too much of this medicine contact apoison control center or emergency room at once. NOTE: This medicine is only for you. Do not share this medicine with others. What if I miss a dose? It is important not to miss a dose. Call your doctor  or health careprofessional if you are unable to keep an appointment. Depot injections: Depot injections are given either once-monthly, every 12 weeks, every 16 weeks, or every 24 weeks depending on the product you are prescribed. The product you are prescribed will be based on if you are male orfemale, and your condition. Make sure you understand your product and dosing. What may interact with this medication? Do not take this medicine with any of the following medications: chasteberry cisapride dronedarone pimozide thioridazine This medicine may also interact with the following medications: herbal or dietary supplements, like black cohosh or DHEA male hormones, like estrogens or progestins and birth control pills, patches, rings, or injections male hormones, like testosterone other medicines that prolong the QT interval (abnormal heart rhythm) This list may not describe all possible interactions. Give your health care provider a list of all the medicines, herbs, non-prescription drugs, or dietary supplements you use. Also tell them if you smoke, drink alcohol, or use illegaldrugs. Some items may interact with your medicine. What should I watch for while using this medication? Visit your doctor or health care professional for regular checks on your progress. During the first weeks of treatment, your symptoms may get worse, but then will improve as you continue your treatment. You may get hot flashes, increased bone pain, increased difficulty passing urine, or an aggravation of nerve symptoms. Discuss these effects with your doctor or health careprofessional, some of them may improve with continued use of this medicine. Male patients may experience a menstrual cycle or spotting during the first months of therapy with this medicine. If this continues, contact your doctor orhealth care professional. This medicine may increase blood sugar. Ask  your healthcare provider if changesin diet or medicines  are needed if you have diabetes. What side effects may I notice from receiving this medication? Side effects that you should report to your doctor or health care professionalas soon as possible: allergic reactions like skin rash, itching or hives, swelling of the face, lips, or tongue breathing problems chest pain depression or memory disorders pain in your legs or groin pain at site where injected or implanted seizures severe headache signs and symptoms of high blood sugar such as being more thirsty or hungry or having to urinate more than normal. You may also feel very tired or have blurry vision swelling of the feet and legs suicidal thoughts or other mood changes visual changes vomiting Side effects that usually do not require medical attention (report to yourdoctor or health care professional if they continue or are bothersome): breast swelling or tenderness decrease in sex drive or performance diarrhea hot flashes loss of appetite muscle, joint, or bone pains nausea redness or irritation at site where injected or implanted skin problems or acne This list may not describe all possible side effects. Call your doctor for medical advice about side effects. You may report side effects to FDA at1-800-FDA-1088. Where should I keep my medication? This drug is given in a hospital or clinic and will not be stored at home. NOTE: This sheet is a summary. It may not cover all possible information. If you have questions about this medicine, talk to your doctor, pharmacist, orhealth care provider.  2022 [employer]vier/Gold Standard (2019-07-18 10:35:13)

## 2021-02-12 NOTE — Progress Notes (Signed)
Hematology and Oncology Follow Up Visit  Richard [name] [id number] [birth year] 75 y.o. 02/12/2021 10:18 AM    Principle Diagnosis: 75 year old man with prostate cancer diagnosed in 2006 with Gleason score 4+3 = 7 with a PSA of 3.66.  He developed castration-sensitive  with biochemical relapse noted in 2016.  He was initially diagnosed 2006 with     Prior Therapy: 1. He is status post retropubic prostatectomy and bilateral lymph node dissection done on 11/25/2004. The pathology showed a Gleason score of 4+4 equals 8 with staging of T2b N0.  2. Sstatus post salvage radiation therapy done between March and May of 2007 for a rise in his PSA to 0.29. He received total of 65 gray in 35 fractions. 3.  He was treated with intermittent Lupron until 2016.  Lupron has been given at that time.  Current therapy:   Eligard 30 mg every 4 months started in April 2016.  Next injection will be given today.  Interim History: Richard [name] is here for return evaluation.  Since the last visit, he reports no major changes in his health.  He denies any recent hospitalization or illnesses.  He denies any bone pain or pathological fractures.  His performance status quality of life remained excellent.  He does report some occasional hot flashes but no other complaints related to Eligard.           Medications: Updated on review.  Current Outpatient Medications on File Prior to Visit  Medication Sig Dispense Refill   amLODipine (NORVASC) 5 MG tablet TAKE 1 TABLET(5 MG) BY MOUTH DAILY 90 tablet 0   atorvastatin (LIPITOR) 10 MG tablet TAKE 1 TABLET(10 MG) BY MOUTH DAILY 90 tablet 0   benzonatate (TESSALON) 100 MG capsule Take 1-2 capsules (100-200 mg total) by mouth every 8 (eight) hours. (Patient not taking: Reported on 08/07/2020) 30 capsule 0   Blood Glucose Monitoring Suppl (ONE TOUCH ULTRA 2) W/DEVICE KIT Use to check blood sugar 3 times per week dx code E11.9 (Patient taking differently: Use to check blood  sugar 3 times per week dx code E11.9) 1 each 0   cetirizine (ZYRTEC) 10 MG tablet Take 1 tablet (10 mg total) by mouth daily. 30 tablet 11   DUREZOL 0.05 % EMUL Place 1 drop into the left eye 3 (three) times daily. (Patient not taking: Reported on 08/07/2020)     fluticasone (FLONASE) 50 MCG/ACT nasal spray Place 2 sprays into both nostrils daily. 16 g 12   glimepiride (AMARYL) 2 MG tablet TAKE 1 TABLET (2 MG TOTAL) BY MOUTH DAILY WITH BREAKFAST. 90 tablet 0   glucose blood (ONE TOUCH ULTRA TEST) test strip Use as instructed to check blood sugar 3 times per week dx code E11.9 100 each 1   Leuprolide Acetate (LUPRON DEPOT, 78-MONTH, IM) Inject into the muscle.     metFORMIN (GLUCOPHAGE) 1000 MG tablet TAKE 1 TABLET(1000 MG) BY MOUTH TWICE DAILY WITH A MEAL 180 tablet 0   ONETOUCH DELICA LANCETS FINE MISC Use to check blood sugar 3 times per week dx code E11.9 100 each 3   pioglitazone (ACTOS) 30 MG tablet TAKE 1 TABLET(30 MG) BY MOUTH DAILY 90 tablet 0   PROLENSA 0.07 % SOLN Place 1 drop into the left eye at bedtime. (Patient not taking: Reported on 08/07/2020)     ramipril (ALTACE) 5 MG capsule TAKE 1 CAPSULE (10 MG TOTAL) BY MOUTH DAILY. (Patient taking differently: TAKE 1 CAPSULE (10 MG TOTAL) BY MOUTH TWICE DAILY.) 90  capsule 2   No current facility-administered medications on file prior to visit.     Allergies:  Allergies  Allergen Reactions   Effexor Xr [Venlafaxine Hcl] Rash      . Physical Exam:     Blood pressure (!) 153/73, pulse 90, temperature 98.1 F (36.7 C), temperature source Tympanic, resp. rate 18, height '5\' 10"'  (1.778 m), weight 214 lb 1.6 oz (97.1 kg), SpO2 100 %.     ECOG: 0   General appearance: Comfortable appearing without any discomfort Head: Normocephalic without any trauma Oropharynx: Mucous membranes are moist and pink without any thrush or ulcers. Eyes: Pupils are equal and round reactive to light. Lymph nodes: No cervical, supraclavicular,  inguinal or axillary lymphadenopathy.   Heart:regular rate and rhythm.  S1 and S2 without leg edema. Lung: Clear without any rhonchi or wheezes.  No dullness to percussion. Abdomin: Soft, nontender, nondistended with good bowel sounds.  No hepatosplenomegaly. Musculoskeletal: No joint deformity or effusion.  Full range of motion noted. Neurological: No deficits noted on motor, sensory and deep tendon reflex exam. Skin: No petechial rash or dryness.  Appeared moist.           Lab Results: Lab Results  Component Value Date   WBC 6.8 02/05/2021   HGB 11.9 (L) 02/05/2021   HCT 36.7 (L) 02/05/2021   MCV 98.1 02/05/2021   PLT 227 02/05/2021     Chemistry      Component Value Date/Time   NA 141 02/05/2021 0852   NA 140 05/24/2017 0810   K 5.3 (H) 02/05/2021 0852   K 4.4 05/24/2017 0810   CL 106 02/05/2021 0852   CL 102 02/20/2013 1254   CO2 26 02/05/2021 0852   CO2 25 05/24/2017 0810   BUN 22 02/05/2021 0852   BUN 24.7 05/24/2017 0810   CREATININE 1.49 (H) 02/05/2021 0852   CREATININE 1.2 05/24/2017 0810      Component Value Date/Time   CALCIUM 9.4 02/05/2021 0852   CALCIUM 9.3 05/24/2017 0810   ALKPHOS 68 02/05/2021 0852   ALKPHOS 60 05/24/2017 0810   AST 17 02/05/2021 0852   AST 14 05/24/2017 0810   ALT 12 02/05/2021 0852   ALT 9 05/24/2017 0810   BILITOT 0.4 02/05/2021 0852   BILITOT 0.41 05/24/2017 0810           Results for Richard Kass "MIKE" (MRN [id number]) as of 02/12/2021 10:20  Ref. Range 10/02/2020 09:26 02/05/2021 08:52  Prostate Specific Ag, Serum Latest Ref Range: 0.0 - 4.0 ng/mL <0.1 <0.1        Impression and Plan:  75 year old man with:   1.  Prostate cancer with a biochemical relapse diagnosed in 2016.  He has castration-sensitive disease.   His disease status was updated at this time and treatment options were reviewed.  He continues to have undetectable PSA on androgen deprivation therapy alone without any disease progression.   Risks and benefits of continuing this treatment versus additional treatment were discussed.  Androgen receptor pathway inhibitors could be added if he developed castration-resistant disease.  He is agreeable to continue at this time.   2.  Androgen deprivation therapy: Long-term complications including osteoporosis, weight gain, hot flashes among others were reviewed.  He is agreeable to proceed today and repeated in 4 months.   3. Diabetes mellitus: No exacerbation related to androgen deprivation at this time.  4.  Anemia: Related to malignancy and chronic disease.  Hemoglobin remains consistent with his baseline.  He  is asymptomatic does not require any intervention.   5. Followup: He will return in 4 months for a follow-up visit.    30 minutes were dedicated to this encounter.  Time was spent on reviewing laboratory data, disease status update and future plan of care discussion.    Zola Button, MD 6/16/202210:18 AM

## 2021-02-16 ENCOUNTER — Telehealth: Payer: Self-pay | Admitting: Oncology

## 2021-02-16 NOTE — Telephone Encounter (Signed)
Called pt per 6/20 sch msg to help make adjustments to schedule. No answer. Left msg for pt to call back.

## 2021-02-17 ENCOUNTER — Telehealth: Payer: Self-pay | Admitting: Oncology

## 2021-02-17 NOTE — Telephone Encounter (Signed)
R/s appt per 6/20 sch msg. Pt's wife is aware.

## 2021-03-30 ENCOUNTER — Other Ambulatory Visit: Payer: Self-pay

## 2021-03-30 ENCOUNTER — Other Ambulatory Visit (INDEPENDENT_AMBULATORY_CARE_PROVIDER_SITE_OTHER): Payer: Medicare PPO

## 2021-03-30 DIAGNOSIS — E119 Type 2 diabetes mellitus without complications: Secondary | ICD-10-CM | POA: Diagnosis not present

## 2021-03-30 DIAGNOSIS — E782 Mixed hyperlipidemia: Secondary | ICD-10-CM

## 2021-03-30 LAB — COMPREHENSIVE METABOLIC PANEL
ALT: 12 U/L (ref 0–53)
AST: 15 U/L (ref 0–37)
Albumin: 4.1 g/dL (ref 3.5–5.2)
Alkaline Phosphatase: 61 U/L (ref 39–117)
BUN: 28 mg/dL — ABNORMAL HIGH (ref 6–23)
CO2: 30 mEq/L (ref 19–32)
Calcium: 9.4 mg/dL (ref 8.4–10.5)
Chloride: 100 mEq/L (ref 96–112)
Creatinine, Ser: 1.61 mg/dL — ABNORMAL HIGH (ref 0.40–1.50)
GFR: 41.37 mL/min — ABNORMAL LOW (ref >60.00)
Glucose, Bld: 126 mg/dL — ABNORMAL HIGH (ref 70–99)
Potassium: 5.2 mEq/L — ABNORMAL HIGH (ref 3.5–5.1)
Sodium: 136 mEq/L (ref 135–145)
Total Bilirubin: 0.4 mg/dL (ref 0.2–1.2)
Total Protein: 6.6 g/dL (ref 6.0–8.3)

## 2021-03-30 LAB — LDL CHOLESTEROL, DIRECT: Direct LDL: 84 mg/dL

## 2021-03-30 LAB — LIPID PANEL
Cholesterol: 169 mg/dL (ref 0–200)
HDL: 57.1 mg/dL (ref >39.00)
NonHDL: 111.79
Total CHOL/HDL Ratio: 3
Triglycerides: 255 mg/dL — ABNORMAL HIGH (ref 0.0–149.0)
VLDL: 51 mg/dL — ABNORMAL HIGH (ref 0.0–40.0)

## 2021-03-30 LAB — HEMOGLOBIN A1C: Hgb A1c MFr Bld: 6.1 % (ref 4.6–6.5)

## 2021-04-01 ENCOUNTER — Encounter: Payer: Self-pay | Admitting: Endocrinology

## 2021-04-01 ENCOUNTER — Ambulatory Visit: Payer: Medicare PPO | Admitting: Endocrinology

## 2021-04-01 ENCOUNTER — Other Ambulatory Visit: Payer: Self-pay

## 2021-04-01 VITALS — BP 128/80 | HR 84 | Ht 70.0 in | Wt 211.2 lb

## 2021-04-01 DIAGNOSIS — E782 Mixed hyperlipidemia: Secondary | ICD-10-CM

## 2021-04-01 DIAGNOSIS — E119 Type 2 diabetes mellitus without complications: Secondary | ICD-10-CM | POA: Diagnosis not present

## 2021-04-01 DIAGNOSIS — E875 Hyperkalemia: Secondary | ICD-10-CM | POA: Diagnosis not present

## 2021-04-01 MED ORDER — LOSARTAN POTASSIUM 25 MG PO TABS: 25.0000 mg | ORAL_TABLET | Freq: Every day | ORAL | 1 refills | Status: DC

## 2021-04-01 MED ORDER — ONETOUCH VERIO VI STRP: ORAL_STRIP | 11 refills | Status: DC

## 2021-04-01 MED ORDER — ONETOUCH ULTRASOFT LANCETS MISC
11 refills | Status: DC
Start: 2021-04-01 — End: 2022-10-28

## 2021-04-01 NOTE — Patient Instructions (Signed)
Stop Ramipril. °

## 2021-04-01 NOTE — Progress Notes (Addendum)
Patient ID: Richard [name], male   DOB: [birth year], 75 y.o.   MRN: [id number]    Reason for Appointment:  Follow-up visit  History of Present Illness   Problem 1: HYPERTENSION: See review of systems for today's notes  DIABETES MELITUS, date of diagnosis 1995 with a glucose of 320 and HemoglobinA1c of 9.6 Previous history: He has been on various oral hypoglycemic drugs in the past but for the last few years has required multiple drugs in combination; diabetes has been generally well controlled with A1c near normal  Recent history:   Oral hypoglycemic drugs: Amaryl 2 mg once a day in a.m., metformin 1000 twice a day, Actos 30 mg daily  His A1c is about the same at 6.1 compared to 6%   Current management, blood sugar patterns and problems identified: He has left his meter out of town and has not checked his readings for 3 months  Dysuria appears to be gradually losing weight  Overall he is not going off his diet usually  Overall he still fairly active with walking and other activities some blood sugars about about 1 time a week Has been regular with all his medications Fasting lab glucose 126 and may be relatively higher in the morning at times In no hypoglycemia with Amaryl   Side effects from medications: None       Monitors blood glucose:  irregularly     Glucometer: One Touch ultra 2           Home blood Glucose readings: As above  Dietician visit: Most recent:? 2007    Weight control:      Wt Readings from Last 3 Encounters:  04/01/21 211 lb 3.2 oz (95.8 kg)  02/12/21 214 lb 1.6 oz (97.1 kg)  10/09/20 219 lb 8 oz (99.6 kg)    Diabetes labs:  Lab Results  Component Value Date   HGBA1C 6.1 03/30/2021   HGBA1C 6.0 08/04/2020   HGBA1C 6.0 11/12/2019   Lab Results  Component Value Date   MICROALBUR 17.4 (H) 08/04/2020   LDLCALC 73 08/04/2020   CREATININE 1.61 (H) 03/30/2021    Other problems addressed today: See review of systems   Lab on  03/30/2021  Component Date Value Ref Range Status   Cholesterol 03/30/2021 169  0 - 200 mg/dL Final   ATP III Classification       Desirable:  < 200 mg/dL               Borderline High:  200 - 239 mg/dL          High:  > = 240 mg/dL   Triglycerides 03/30/2021 255.0 (A) 0.0 - 149.0 mg/dL Final   Normal:  <150 mg/dLBorderline High:  150 - 199 mg/dL   HDL 03/30/2021 57.10  >39.00 mg/dL Final   VLDL 03/30/2021 51.0 (A) 0.0 - 40.0 mg/dL Final   Total CHOL/HDL Ratio 03/30/2021 3   Final                  Men          Women1/2 Average Risk     3.4          3.3Average Risk          5.0          4.42X Average Risk          9.6          7.13X Average Risk  15.0          11.0                       NonHDL 03/30/2021 111.79   Final   NOTE:  Non-HDL goal should be 30 mg/dL higher than patient's LDL goal (i.e. LDL goal of < 70 mg/dL, would have non-HDL goal of < 100 mg/dL)   Sodium 03/30/2021 136  135 - 145 mEq/L Final   Potassium 03/30/2021 5.2 No hemolysis seen (A) 3.5 - 5.1 mEq/L Final   Chloride 03/30/2021 100  96 - 112 mEq/L Final   CO2 03/30/2021 30  19 - 32 mEq/L Final   Glucose, Bld 03/30/2021 126 (A) 70 - 99 mg/dL Final   BUN 03/30/2021 28 (A) 6 - 23 mg/dL Final   Creatinine, Ser 03/30/2021 1.61 (A) 0.40 - 1.50 mg/dL Final   Total Bilirubin 03/30/2021 0.4  0.2 - 1.2 mg/dL Final   Alkaline Phosphatase 03/30/2021 61  39 - 117 U/L Final   AST 03/30/2021 15  0 - 37 U/L Final   ALT 03/30/2021 12  0 - 53 U/L Final   Total Protein 03/30/2021 6.6  6.0 - 8.3 g/dL Final   Albumin 03/30/2021 4.1  3.5 - 5.2 g/dL Final   GFR 03/30/2021 41.37 (A) >60.00 mL/min Final   Calculated using the CKD-EPI Creatinine Equation (2021)   Calcium 03/30/2021 9.4  8.4 - 10.5 mg/dL Final   Hgb A1c MFr Bld 03/30/2021 6.1  4.6 - 6.5 % Final   Glycemic Control Guidelines for People with Diabetes:Non Diabetic:  <6%Goal of Therapy: <7%Additional Action Suggested:  >8%    Direct LDL 03/30/2021 84.0  mg/dL Final    Optimal:  <100 mg/dLNear or Above Optimal:  100-129 mg/dLBorderline High:  130-159 mg/dLHigh:  160-189 mg/dLVery High:  >190 mg/dL     Allergies as of 04/01/2021       Reactions   Effexor Xr [venlafaxine Hcl] Rash        Medication List        Accurate as of April 01, 2021 11:59 PM. If you have any questions, ask your nurse or doctor.          STOP taking these medications    benzonatate 100 MG capsule Commonly known as: TESSALON Stopped by: Elayne Snare, MD   cetirizine 10 MG tablet Commonly known as: ZYRTEC Stopped by: Elayne Snare, MD   Durezol 0.05 % Emul Generic drug: Difluprednate Stopped by: Elayne Snare, MD   fluticasone 50 MCG/ACT nasal spray Commonly known as: FLONASE Stopped by: Elayne Snare, MD   Prolensa 0.07 % Soln Generic drug: Bromfenac Sodium Stopped by: Elayne Snare, MD   ramipril 5 MG capsule Commonly known as: ALTACE Stopped by: Elayne Snare, MD       TAKE these medications    amLODipine 5 MG tablet Commonly known as: NORVASC TAKE 1 TABLET(5 MG) BY MOUTH DAILY   atorvastatin 10 MG tablet Commonly known as: LIPITOR TAKE 1 TABLET(10 MG) BY MOUTH DAILY   glimepiride 2 MG tablet Commonly known as: AMARYL TAKE 1 TABLET (2 MG TOTAL) BY MOUTH DAILY WITH BREAKFAST.   losartan 25 MG tablet Commonly known as: COZAAR Take 1 tablet (25 mg total) by mouth daily. Started by: Elayne Snare, MD   LUPRON DEPOT (57-MONTH) IM Inject into the muscle.   metFORMIN 1000 MG tablet Commonly known as: GLUCOPHAGE TAKE 1 TABLET(1000 MG) BY MOUTH TWICE DAILY WITH A MEAL   ONE TOUCH  ULTRA 2 w/Device Kit Use to check blood sugar 3 times per week dx code E11.9   onetouch ultrasoft lancets Use as instructed to check blood sugar 2 times daily Started by: Elayne Snare, MD   OneTouch Verio test strip Generic drug: glucose blood Use as instructed to check blood sugar 2 times daily Started by: Elayne Snare, MD   pioglitazone 30 MG tablet Commonly known as:  ACTOS TAKE 1 TABLET(30 MG) BY MOUTH DAILY        Allergies:  Allergies  Allergen Reactions   Effexor Xr [Venlafaxine Hcl] Rash    Past Medical History:  Diagnosis Date   Diabetes mellitus without complication ([place])    Hypertension    prostate ca dx'd 2006   prostatectomy and xrt    No past surgical history on file.  Family History  Problem Relation Age of Onset   Hypertension Father    Heart disease Neg Hx    Diabetes Neg Hx     Social History:  reports that he has been smoking. He has never used smokeless tobacco. No history on file for alcohol use and drug use.    REVIEW OF SYSTEMS:       Hypertension:  blood pressure has been  well controlled with 5 mg ramipril, amlodipine 5 mg  Not monitoring blood pressure at home    BP Readings from Last 3 Encounters:  04/01/21 128/80  02/12/21 (!) 146/76  02/12/21 (!) 153/73   Has slightly variable but high creatinine values for some time Creatinine is slightly higher Tends to have high normal potassium levels but now potassium is persistently high  Lab Results  Component Value Date   CREATININE 1.61 (H) 03/30/2021   CREATININE 1.49 (H) 02/05/2021   CREATININE 1.43 (H) 10/02/2020   Lab Results  Component Value Date   K 5.2 No hemolysis seen (H) 03/30/2021     Lipids:  he has been treated with Lipitor 10 mg which he takes daily LDL controlled but his triglycerides were checked after lunch and were higher, usually normal   Lab Results  Component Value Date   CHOL 169 03/30/2021   HDL 57.10 03/30/2021   LDLCALC 73 08/04/2020   LDLDIRECT 84.0 03/30/2021   TRIG 255.0 (H) 03/30/2021   CHOLHDL 3 03/30/2021     History of vitamin B12 deficiency on supplement Has mild chronic anemia His last B12 level is normal  CBC followed by oncologist    Last eye exam was in 8/20  He has had no symptoms of burning or tingling in his toes  LABS:  Lab on 03/30/2021  Component Date Value Ref Range Status    Cholesterol 03/30/2021 169  0 - 200 mg/dL Final   ATP III Classification       Desirable:  < 200 mg/dL               Borderline High:  200 - 239 mg/dL          High:  > = 240 mg/dL   Triglycerides 03/30/2021 255.0 (A) 0.0 - 149.0 mg/dL Final   Normal:  <150 mg/dLBorderline High:  150 - 199 mg/dL   HDL 03/30/2021 57.10  >39.00 mg/dL Final   VLDL 03/30/2021 51.0 (A) 0.0 - 40.0 mg/dL Final   Total CHOL/HDL Ratio 03/30/2021 3   Final                  Men          Women1/2 Average Risk  3.4          3.3Average Risk          5.0          4.42X Average Risk          9.6          7.13X Average Risk          15.0          11.0                       NonHDL 03/30/2021 111.79   Final   NOTE:  Non-HDL goal should be 30 mg/dL higher than patient's LDL goal (i.e. LDL goal of < 70 mg/dL, would have non-HDL goal of < 100 mg/dL)   Sodium 03/30/2021 136  135 - 145 mEq/L Final   Potassium 03/30/2021 5.2 No hemolysis seen (A) 3.5 - 5.1 mEq/L Final   Chloride 03/30/2021 100  96 - 112 mEq/L Final   CO2 03/30/2021 30  19 - 32 mEq/L Final   Glucose, Bld 03/30/2021 126 (A) 70 - 99 mg/dL Final   BUN 03/30/2021 28 (A) 6 - 23 mg/dL Final   Creatinine, Ser 03/30/2021 1.61 (A) 0.40 - 1.50 mg/dL Final   Total Bilirubin 03/30/2021 0.4  0.2 - 1.2 mg/dL Final   Alkaline Phosphatase 03/30/2021 61  39 - 117 U/L Final   AST 03/30/2021 15  0 - 37 U/L Final   ALT 03/30/2021 12  0 - 53 U/L Final   Total Protein 03/30/2021 6.6  6.0 - 8.3 g/dL Final   Albumin 03/30/2021 4.1  3.5 - 5.2 g/dL Final   GFR 03/30/2021 41.37 (A) >60.00 mL/min Final   Calculated using the CKD-EPI Creatinine Equation (2021)   Calcium 03/30/2021 9.4  8.4 - 10.5 mg/dL Final   Hgb A1c MFr Bld 03/30/2021 6.1  4.6 - 6.5 % Final   Glycemic Control Guidelines for People with Diabetes:Non Diabetic:  <6%Goal of Therapy: <7%Additional Action Suggested:  >8%    Direct LDL 03/30/2021 84.0  mg/dL Final   Optimal:  <100 mg/dLNear or Above Optimal:  100-129  mg/dLBorderline High:  130-159 mg/dLHigh:  160-189 mg/dLVery High:  >190 mg/dL     Examination:   BP 128/80 (BP Location: Left Arm, Patient Position: Sitting, Cuff Size: Normal)   Pulse 84   Ht '5\' 10"'  (1.778 m)   Wt 211 lb 3.2 oz (95.8 kg)   SpO2 95%   BMI 30.30 kg/m   Body mass index is 30.3 kg/m.   Diabetic Foot Exam - Simple   Simple Foot Form Diabetic Foot exam was performed with the following findings: Yes   Visual Inspection No deformities, no ulcerations, no other skin breakdown bilaterally: Yes Sensation Testing Intact to touch and monofilament testing bilaterally: Yes Pulse Check Posterior Tibialis and Dorsalis pulse intact bilaterally: Yes Comments    No ankle edema present  ASSESSMENT/ PLAN:    Diabetes type 2 with mild obesity:    He is on Actos, Amaryl and Metformin long-term  No side effects with his medications  Blood sugars are well controlled with A1c consistently around 6% He is not monitoring his blood sugar at home lately but his lab glucose was 126 after lunch He has not monitored home blood sugars but he thinks he can start back on checking Discussed blood glucose targets Since he is losing weight he will continue to follow his good lifestyle with diet and exercise  Hypertension: Blood  pressure is well controlled with Norvasc and ramipril However because of his hyperkalemia will switch him to losartan 25 mg daily   He will come back in a month for further adjustment of the dose and follow-up of his renal function and potassium    LIPIDS: LDL well-controlled on atorvastatin, will need to check his fasting triglycerides   Elayne Snare 04/02/2021, 11:41 AM     Note: This office note was prepared with Dragon voice recognition system technology. Any transcriptional errors that result from this process are unintentional.

## 2021-04-10 ENCOUNTER — Other Ambulatory Visit: Payer: Self-pay

## 2021-04-10 DIAGNOSIS — E119 Type 2 diabetes mellitus without complications: Secondary | ICD-10-CM

## 2021-04-10 MED ORDER — ACCU-CHEK GUIDE W/DEVICE KIT: PACK | 0 refills | Status: AC

## 2021-04-10 MED ORDER — ACCU-CHEK GUIDE VI STRP: ORAL_STRIP | 2 refills | Status: AC

## 2021-04-10 NOTE — Telephone Encounter (Signed)
Received a fax from pharmacy that the One Touch testing supplies is not covered by patients insurance. Per pharmacy Accu-check guide is. Rx sent in for Accu-check guide supplies.

## 2021-04-23 ENCOUNTER — Other Ambulatory Visit: Payer: Self-pay | Admitting: Endocrinology

## 2021-04-27 ENCOUNTER — Other Ambulatory Visit: Payer: Self-pay

## 2021-04-27 ENCOUNTER — Other Ambulatory Visit (INDEPENDENT_AMBULATORY_CARE_PROVIDER_SITE_OTHER): Payer: Medicare PPO

## 2021-04-27 DIAGNOSIS — E782 Mixed hyperlipidemia: Secondary | ICD-10-CM

## 2021-04-27 DIAGNOSIS — E119 Type 2 diabetes mellitus without complications: Secondary | ICD-10-CM

## 2021-04-27 LAB — LIPID PANEL
Cholesterol: 160 mg/dL (ref 0–200)
HDL: 59.9 mg/dL (ref >39.00)
LDL Cholesterol: 72 mg/dL (ref 0–99)
NonHDL: 100.4
Total CHOL/HDL Ratio: 3
Triglycerides: 142 mg/dL (ref 0.0–149.0)
VLDL: 28.4 mg/dL (ref 0.0–40.0)

## 2021-04-27 LAB — BASIC METABOLIC PANEL
BUN: 27 mg/dL — ABNORMAL HIGH (ref 6–23)
CO2: 27 mEq/L (ref 19–32)
Calcium: 9.4 mg/dL (ref 8.4–10.5)
Chloride: 105 mEq/L (ref 96–112)
Creatinine, Ser: 1.49 mg/dL (ref 0.40–1.50)
GFR: 45.38 mL/min — ABNORMAL LOW (ref >60.00)
Glucose, Bld: 95 mg/dL (ref 70–99)
Potassium: 4.8 mEq/L (ref 3.5–5.1)
Sodium: 139 mEq/L (ref 135–145)

## 2021-04-29 NOTE — Progress Notes (Signed)
Patient ID: Richard [name], male   DOB: 06-Jul-1946, 75 y.o.   MRN: [id number]    Reason for Appointment:  Follow-up visit  History of Present Illness   Problem 1: HYPERTENSION: See review of systems for details  Problem 2: DIABETES MELITUS, date of diagnosis 1995 with a glucose of 320 and HemoglobinA1c of 9.6 Previous history: He has been on various oral hypoglycemic drugs in the past but for the last few years has required multiple drugs in combination; diabetes has been generally well controlled with A1c near normal  Recent history:   Oral hypoglycemic drugs: Amaryl 2 mg once a day in a.m., metformin 1000 twice a day, Actos 30 mg daily  His A1c is recently the same at 6.1 compared to 6%   Current management, blood sugar patterns and problems identified: He has checked his blood sugars somewhat infrequently and mostly around midday  He has lost another couple of pounds  Eating that he is generally trying to eat smaller portions  He is very active outside and he is either doing yard work or other activities consistently Highest blood sugar was only 145 early afternoon   Side effects from medications: None       Monitors blood glucose:  irregularly     Glucometer: One Touch ultra 2           Home blood Glucose readings: Average 118 with only about 5 recent readings  Dietician visit: Most recent:? 2007    Weight control:      Wt Readings from Last 3 Encounters:  04/30/21 209 lb 9.6 oz (95.1 kg)  04/01/21 211 lb 3.2 oz (95.8 kg)  02/12/21 214 lb 1.6 oz (97.1 kg)    Diabetes labs:  Lab Results  Component Value Date   HGBA1C 6.1 03/30/2021   HGBA1C 6.0 08/04/2020   HGBA1C 6.0 11/12/2019   Lab Results  Component Value Date   MICROALBUR 17.4 (H) 08/04/2020   LDLCALC 72 04/27/2021   CREATININE 1.49 04/27/2021    Other problems addressed today: See review of systems   Lab on 04/27/2021  Component Date Value Ref Range Status   Cholesterol 04/27/2021  160  0 - 200 mg/dL Final   ATP III Classification       Desirable:  < 200 mg/dL               Borderline High:  200 - 239 mg/dL          High:  > = 240 mg/dL   Triglycerides 04/27/2021 142.0  0.0 - 149.0 mg/dL Final   Normal:  <150 mg/dLBorderline High:  150 - 199 mg/dL   HDL 04/27/2021 59.90  >39.00 mg/dL Final   VLDL 04/27/2021 28.4  0.0 - 40.0 mg/dL Final   LDL Cholesterol 04/27/2021 72  0 - 99 mg/dL Final   Total CHOL/HDL Ratio 04/27/2021 3   Final                  Men          Women1/2 Average Risk     3.4          3.3Average Risk          5.0          4.42X Average Risk          9.6          7.13X Average Risk          15.0  11.0                       NonHDL 04/27/2021 100.40   Final   NOTE:  Non-HDL goal should be 30 mg/dL higher than patient's LDL goal (i.e. LDL goal of < 70 mg/dL, would have non-HDL goal of < 100 mg/dL)   Sodium 04/27/2021 139  135 - 145 mEq/L Final   Potassium 04/27/2021 4.8  3.5 - 5.1 mEq/L Final   Chloride 04/27/2021 105  96 - 112 mEq/L Final   CO2 04/27/2021 27  19 - 32 mEq/L Final   Glucose, Bld 04/27/2021 95  70 - 99 mg/dL Final   BUN 04/27/2021 27 (A) 6 - 23 mg/dL Final   Creatinine, Ser 04/27/2021 1.49  0.40 - 1.50 mg/dL Final   GFR 04/27/2021 45.38 (A) >60.00 mL/min Final   Calculated using the CKD-EPI Creatinine Equation (2021)   Calcium 04/27/2021 9.4  8.4 - 10.5 mg/dL Final     Allergies as of 04/30/2021       Reactions   Effexor Xr [venlafaxine Hcl] Rash        Medication List        Accurate as of April 30, 2021 11:56 AM. If you have any questions, ask your nurse or doctor.          Accu-Chek Guide test strip Generic drug: glucose blood Use to check blood sugars daily   Accu-Chek Guide w/Device Kit Use to check blood sugars daily   amLODipine 5 MG tablet Commonly known as: NORVASC TAKE 1 TABLET(5 MG) BY MOUTH DAILY   atorvastatin 10 MG tablet Commonly known as: LIPITOR TAKE 1 TABLET(10 MG) BY MOUTH DAILY    glimepiride 2 MG tablet Commonly known as: AMARYL TAKE 1 TABLET (2 MG TOTAL) BY MOUTH DAILY WITH BREAKFAST.   losartan 25 MG tablet Commonly known as: COZAAR Take 1 tablet (25 mg total) by mouth daily.   LUPRON DEPOT (70-MONTH) IM Inject into the muscle.   metFORMIN 1000 MG tablet Commonly known as: GLUCOPHAGE TAKE 1 TABLET(1000 MG) BY MOUTH TWICE DAILY WITH A MEAL   onetouch ultrasoft lancets Use as instructed to check blood sugar 2 times daily   pioglitazone 30 MG tablet Commonly known as: ACTOS TAKE 1 TABLET(30 MG) BY MOUTH DAILY        Allergies:  Allergies  Allergen Reactions   Effexor Xr [Venlafaxine Hcl] Rash    Past Medical History:  Diagnosis Date   Diabetes mellitus without complication ([place])    Hypertension    prostate ca dx'd 2006   prostatectomy and xrt    No past surgical history on file.  Family History  Problem Relation Age of Onset   Hypertension Father    Heart disease Neg Hx    Diabetes Neg Hx     Social History:  reports that he has been smoking. He has never used smokeless tobacco. No history on file for alcohol use and drug use.    REVIEW OF SYSTEMS:       Hypertension: Was on 5 mg ramipril and this was changed to 25 mg losartan because of high potassium and her renal function study  Also on amlodipine 5 mg  Not monitoring blood pressure at home, occasionally will check at [facility]   BP Readings from Last 3 Encounters:  04/30/21 (!) 148/80  04/01/21 128/80  02/12/21 (!) 146/76    Has slightly variable but high creatinine levels for some time Creatinine is slightly  better now Tends to have high normal potassium levels  This is not improved with switching from ramipril to losartan 25 mg  Lab Results  Component Value Date   CREATININE 1.49 04/27/2021   CREATININE 1.61 (H) 03/30/2021   CREATININE 1.49 (H) 02/05/2021   Lab Results  Component Value Date   K 4.8 04/27/2021     Lipids:  he has been treated with Lipitor 10  mg which he takes daily Recent fasting labs are excellent  Lab Results  Component Value Date   CHOL 160 04/27/2021   CHOL 169 03/30/2021   CHOL 165 08/04/2020   Lab Results  Component Value Date   HDL 59.90 04/27/2021   HDL 57.10 03/30/2021   HDL 66.70 08/04/2020   Lab Results  Component Value Date   LDLCALC 72 04/27/2021   LDLCALC 73 08/04/2020   LDLCALC 82 11/12/2019   Lab Results  Component Value Date   TRIG 142.0 04/27/2021   TRIG 255.0 (H) 03/30/2021   TRIG 127.0 08/04/2020   Lab Results  Component Value Date   CHOLHDL 3 04/27/2021   CHOLHDL 3 03/30/2021   CHOLHDL 2 08/04/2020   Lab Results  Component Value Date   LDLDIRECT 84.0 03/30/2021      History of vitamin B12 deficiency on supplement Has mild chronic anemia His last B12 level is normal  CBC followed by oncologist    Last eye exam was in 8/20 Last foot exam 8/22  LABS:  Lab on 04/27/2021  Component Date Value Ref Range Status   Cholesterol 04/27/2021 160  0 - 200 mg/dL Final   ATP III Classification       Desirable:  < 200 mg/dL               Borderline High:  200 - 239 mg/dL          High:  > = 240 mg/dL   Triglycerides 04/27/2021 142.0  0.0 - 149.0 mg/dL Final   Normal:  <150 mg/dLBorderline High:  150 - 199 mg/dL   HDL 04/27/2021 59.90  >39.00 mg/dL Final   VLDL 04/27/2021 28.4  0.0 - 40.0 mg/dL Final   LDL Cholesterol 04/27/2021 72  0 - 99 mg/dL Final   Total CHOL/HDL Ratio 04/27/2021 3   Final                  Men          Women1/2 Average Risk     3.4          3.3Average Risk          5.0          4.42X Average Risk          9.6          7.13X Average Risk          15.0          11.0                       NonHDL 04/27/2021 100.40   Final   NOTE:  Non-HDL goal should be 30 mg/dL higher than patient's LDL goal (i.e. LDL goal of < 70 mg/dL, would have non-HDL goal of < 100 mg/dL)   Sodium 04/27/2021 139  135 - 145 mEq/L Final   Potassium 04/27/2021 4.8  3.5 - 5.1 mEq/L Final   Chloride  04/27/2021 105  96 - 112 mEq/L Final   CO2 04/27/2021 27  19 - 32 mEq/L Final   Glucose, Bld 04/27/2021 95  70 - 99 mg/dL Final   BUN 04/27/2021 27 (A) 6 - 23 mg/dL Final   Creatinine, Ser 04/27/2021 1.49  0.40 - 1.50 mg/dL Final   GFR 04/27/2021 45.38 (A) >60.00 mL/min Final   Calculated using the CKD-EPI Creatinine Equation (2021)   Calcium 04/27/2021 9.4  8.4 - 10.5 mg/dL Final     Examination:   BP (!) 148/80   Pulse 74   Ht 5' 10.5" (1.791 m)   Wt 209 lb 9.6 oz (95.1 kg)   SpO2 98%   BMI 29.65 kg/m   Body mass index is 29.65 kg/m.   Blood pressure about the same on second measurement last day of No lower leg edema present  ASSESSMENT/ PLAN:    Diabetes type 2 with mild obesity:    He is on Actos, Amaryl and Metformin long-term  Last A1c 6.1  He did bring in his monitor for review and blood sugars are looking excellent, fasting lab showed glucose of 95  Hypertension: Blood pressure is not as well controlled with switching from ramipril to 25 mg losartan  Since he tends to have a relatively high creatinine and potassium we will not go up on the losartan but go up to 10 mg on the AMLODIPINE He is scheduled to see his PCP in about 2 weeks and we will discuss blood pressure management with her again Advised need to restrict high potassium foods such as certain fruits and baked potatoes   LIPIDS: LDL well-controlled on atorvastatin, triglycerides normal with fasting labs   Elayne Snare 04/30/2021, 11:56 AM     Note: This office note was prepared with Dragon voice recognition system technology. Any transcriptional errors that result from this process are unintentional.

## 2021-04-30 ENCOUNTER — Ambulatory Visit: Payer: Medicare PPO | Admitting: Endocrinology

## 2021-04-30 ENCOUNTER — Encounter: Payer: Self-pay | Admitting: Endocrinology

## 2021-04-30 ENCOUNTER — Other Ambulatory Visit: Payer: Self-pay

## 2021-04-30 VITALS — BP 148/80 | HR 74 | Ht 70.5 in | Wt 209.6 lb

## 2021-04-30 DIAGNOSIS — E782 Mixed hyperlipidemia: Secondary | ICD-10-CM

## 2021-04-30 DIAGNOSIS — I10 Essential (primary) hypertension: Secondary | ICD-10-CM | POA: Diagnosis not present

## 2021-04-30 DIAGNOSIS — N289 Disorder of kidney and ureter, unspecified: Secondary | ICD-10-CM | POA: Diagnosis not present

## 2021-04-30 DIAGNOSIS — E119 Type 2 diabetes mellitus without complications: Secondary | ICD-10-CM

## 2021-04-30 MED ORDER — LOSARTAN POTASSIUM 25 MG PO TABS
25.0000 mg | ORAL_TABLET | Freq: Every day | ORAL | 1 refills | Status: DC
Start: 2021-04-30 — End: 2021-06-18

## 2021-04-30 NOTE — Patient Instructions (Addendum)
Amlodipine '10mg'$  and check BP weekly Stay on 25 Losartan

## 2021-05-05 ENCOUNTER — Telehealth: Payer: Self-pay | Admitting: Endocrinology

## 2021-05-05 DIAGNOSIS — I10 Essential (primary) hypertension: Secondary | ICD-10-CM

## 2021-05-05 NOTE — Telephone Encounter (Signed)
Message from service that patient's wife called to advise that patients dosage of Amlodipine was changed from 5 MG to 10 MG and that a new RX needs to be sent to his pharmacy.

## 2021-05-06 MED ORDER — AMLODIPINE BESYLATE 10 MG PO TABS: 10.0000 mg | ORAL_TABLET | Freq: Every day | ORAL | 2 refills | Status: AC

## 2021-05-06 NOTE — Telephone Encounter (Signed)
Rx sent for 10 mg amlodipine per last office visit notes

## 2021-06-11 ENCOUNTER — Other Ambulatory Visit: Payer: Medicare PPO

## 2021-06-16 ENCOUNTER — Inpatient Hospital Stay: Payer: Medicare PPO | Attending: Oncology

## 2021-06-16 ENCOUNTER — Other Ambulatory Visit: Payer: Self-pay

## 2021-06-16 DIAGNOSIS — D649 Anemia, unspecified: Secondary | ICD-10-CM | POA: Insufficient documentation

## 2021-06-16 DIAGNOSIS — Z5111 Encounter for antineoplastic chemotherapy: Secondary | ICD-10-CM | POA: Insufficient documentation

## 2021-06-16 DIAGNOSIS — C61 Malignant neoplasm of prostate: Secondary | ICD-10-CM | POA: Insufficient documentation

## 2021-06-16 DIAGNOSIS — E119 Type 2 diabetes mellitus without complications: Secondary | ICD-10-CM | POA: Insufficient documentation

## 2021-06-16 LAB — CBC WITH DIFFERENTIAL (CANCER CENTER ONLY)
Abs Immature Granulocytes: 0.03 10*3/uL (ref 0.00–0.07)
Basophils Absolute: 0.1 10*3/uL (ref 0.0–0.1)
Basophils Relative: 1 %
Eosinophils Absolute: 0.1 10*3/uL (ref 0.0–0.5)
Eosinophils Relative: 1 %
HCT: 36.7 % — ABNORMAL LOW (ref 39.0–52.0)
Hemoglobin: 11.7 g/dL — ABNORMAL LOW (ref 13.0–17.0)
Immature Granulocytes: 0 %
Lymphocytes Relative: 25 %
Lymphs Abs: 1.8 10*3/uL (ref 0.7–4.0)
MCH: 31.7 pg (ref 26.0–34.0)
MCHC: 31.9 g/dL (ref 30.0–36.0)
MCV: 99.5 fL (ref 80.0–100.0)
Monocytes Absolute: 0.7 10*3/uL (ref 0.1–1.0)
Monocytes Relative: 10 %
Neutro Abs: 4.5 10*3/uL (ref 1.7–7.7)
Neutrophils Relative %: 63 %
Platelet Count: 237 10*3/uL (ref 150–400)
RBC: 3.69 MIL/uL — ABNORMAL LOW (ref 4.22–5.81)
RDW: 12.7 % (ref 11.5–15.5)
WBC Count: 7.2 10*3/uL (ref 4.0–10.5)
nRBC: 0 % (ref 0.0–0.2)

## 2021-06-16 LAB — CMP (CANCER CENTER ONLY)
ALT: 10 U/L (ref 0–44)
AST: 15 U/L (ref 15–41)
Albumin: 3.9 g/dL (ref 3.5–5.0)
Alkaline Phosphatase: 55 U/L (ref 38–126)
Anion gap: 10 (ref 5–15)
BUN: 26 mg/dL — ABNORMAL HIGH (ref 8–23)
CO2: 25 mmol/L (ref 22–32)
Calcium: 9.5 mg/dL (ref 8.9–10.3)
Chloride: 103 mmol/L (ref 98–111)
Creatinine: 1.5 mg/dL — ABNORMAL HIGH (ref 0.61–1.24)
GFR, Estimated: 48 mL/min — ABNORMAL LOW (ref >60)
Glucose, Bld: 95 mg/dL (ref 70–99)
Potassium: 4.8 mmol/L (ref 3.5–5.1)
Sodium: 138 mmol/L (ref 135–145)
Total Bilirubin: 0.4 mg/dL (ref 0.3–1.2)
Total Protein: 6.7 g/dL (ref 6.5–8.1)

## 2021-06-17 LAB — PROSTATE-SPECIFIC AG, SERUM (LABCORP): Prostate Specific Ag, Serum: <0.1 ng/mL (ref 0.0–4.0)

## 2021-06-18 ENCOUNTER — Inpatient Hospital Stay: Payer: Medicare PPO

## 2021-06-18 ENCOUNTER — Encounter: Payer: Self-pay | Admitting: *Deleted

## 2021-06-18 ENCOUNTER — Inpatient Hospital Stay: Payer: Medicare PPO | Admitting: Oncology

## 2021-06-18 ENCOUNTER — Other Ambulatory Visit: Payer: Medicare PPO

## 2021-06-18 ENCOUNTER — Other Ambulatory Visit: Payer: Self-pay

## 2021-06-18 VITALS — BP 153/67 | HR 67 | Temp 97.6°F | Resp 17 | Ht 70.5 in | Wt 213.2 lb

## 2021-06-18 DIAGNOSIS — Z8546 Personal history of malignant neoplasm of prostate: Secondary | ICD-10-CM

## 2021-06-18 DIAGNOSIS — C61 Malignant neoplasm of prostate: Secondary | ICD-10-CM

## 2021-06-18 DIAGNOSIS — Z5111 Encounter for antineoplastic chemotherapy: Secondary | ICD-10-CM | POA: Diagnosis not present

## 2021-06-18 MED ORDER — LEUPROLIDE ACETATE (4 MONTH) 30 MG ~~LOC~~ KIT
30.0000 mg | PACK | Freq: Once | SUBCUTANEOUS | Status: AC
  Administered 2021-06-18: 30 mg via SUBCUTANEOUS
  Filled 2021-06-18: qty 30

## 2021-06-18 NOTE — Progress Notes (Signed)
Hematology and Oncology Follow Up Visit  Richard [name] [id number] 1946/06/28 75 y.o. 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  He initially diagnosed in 2006 with Gleason score 4+3 = 7 with a PSA of 3.66.    Prior Therapy: 1. He is status post retropubic prostatectomy and bilateral lymph node dissection done on 11/25/2004. The pathology showed a Gleason score of 4+4 equals 8 with staging of T2b N0.  2. Sstatus post salvage radiation therapy done between March and May of 2007 for a rise in his PSA to 0.29. He received total of 65 gray in 35 fractions. 3.  He was treated with intermittent Lupron until 2016.  Lupron has been given at that time.  Current therapy:   Eligard 30 mg every 4 months started in April 2016.  He will receive injection today.  Interim History: Richard [name] presents today for a follow-up visit.  Since the last visit, he reports no major changes in his health.  He does report some occasional incontinence and currently under evaluation by urology.  He denies any bone pain or pathological fractures.  He denies any shortness of breath or difficulty breathing.  Performance status quality of life remained reasonable.           Medications: Reviewed without changes.  Current Outpatient Medications on File Prior to Visit  Medication Sig Dispense Refill   amLODipine (NORVASC) 10 MG tablet Take 1 tablet (10 mg total) by mouth daily. 90 tablet 2   amLODipine (NORVASC) 5 MG tablet TAKE 1 TABLET(5 MG) BY MOUTH DAILY 90 tablet 0   atorvastatin (LIPITOR) 10 MG tablet TAKE 1 TABLET(10 MG) BY MOUTH DAILY 90 tablet 0   Blood Glucose Monitoring Suppl (ACCU-CHEK GUIDE) w/Device KIT Use to check blood sugars daily 1 kit 0   glimepiride (AMARYL) 2 MG tablet TAKE 1 TABLET (2 MG TOTAL) BY MOUTH DAILY WITH BREAKFAST. 90 tablet 0   glucose blood (ACCU-CHEK GUIDE) test strip Use to  check blood sugars daily 100 each 2   Lancets (ONETOUCH ULTRASOFT) lancets Use as instructed to check blood sugar 2 times daily 100 each 11   Leuprolide Acetate (LUPRON DEPOT, 29-MONTH, IM) Inject into the muscle.     losartan (COZAAR) 25 MG tablet Take 1 tablet (25 mg total) by mouth daily. 90 tablet 1   metFORMIN (GLUCOPHAGE) 1000 MG tablet TAKE 1 TABLET(1000 MG) BY MOUTH TWICE DAILY WITH A MEAL 180 tablet 0   pioglitazone (ACTOS) 30 MG tablet TAKE 1 TABLET(30 MG) BY MOUTH DAILY 90 tablet 0   No current facility-administered medications on file prior to visit.     Allergies:  Allergies  Allergen Reactions   Effexor Xr [Venlafaxine Hcl] Rash      . Physical Exam:       Blood pressure (!) 153/67, pulse 67, temperature 97.6 F (36.4 C), temperature source Oral, resp. rate 17, height 5' 10.5" (1.791 m), weight 213 lb 3.2 oz (96.7 kg), SpO2 98 %.    ECOG: 0    General appearance: Alert, awake without any distress. Head: Atraumatic without abnormalities Oropharynx: Without any thrush or ulcers. Eyes: No scleral icterus. Lymph nodes: No lymphadenopathy noted in the cervical, supraclavicular, or axillary nodes Heart:regular rate and rhythm, without any murmurs or gallops.   Lung: Clear to auscultation without any rhonchi, wheezes or dullness to percussion. Abdomin: Soft, nontender without any shifting dullness or ascites. Musculoskeletal: No clubbing or  cyanosis. Neurological: No motor or sensory deficits. Skin: No rashes or lesions.           Lab Results: Lab Results  Component Value Date   WBC 7.2 06/16/2021   HGB 11.7 (L) 06/16/2021   HCT 36.7 (L) 06/16/2021   MCV 99.5 06/16/2021   PLT 237 06/16/2021     Chemistry      Component Value Date/Time   NA 138 06/16/2021 1252   NA 140 05/24/2017 0810   K 4.8 06/16/2021 1252   K 4.4 05/24/2017 0810   CL 103 06/16/2021 1252   CL 102 02/20/2013 1254   CO2 25 06/16/2021 1252   CO2 25 05/24/2017 0810   BUN  26 (H) 06/16/2021 1252   BUN 24.7 05/24/2017 0810   CREATININE 1.50 (H) 06/16/2021 1252   CREATININE 1.2 05/24/2017 0810      Component Value Date/Time   CALCIUM 9.5 06/16/2021 1252   CALCIUM 9.3 05/24/2017 0810   ALKPHOS 55 06/16/2021 1252   ALKPHOS 60 05/24/2017 0810   AST 15 06/16/2021 1252   AST 14 05/24/2017 0810   ALT 10 06/16/2021 1252   ALT 9 05/24/2017 0810   BILITOT 0.4 06/16/2021 1252   BILITOT 0.41 05/24/2017 0810            Results for Richard, Richard [name] "Richard [name]" (MRN [id number]) as of 06/18/2021 13:22  Ref. Range 06/16/2021 12:52  Prostate Specific Ag, Serum Latest Ref Range: 0.0 - 4.0 ng/mL <0.1        Impression and Plan:  75 year old man with:   1.  Castration-sensitive prostate cancer with a biochemical relapse diagnosed in 2016.     He is currently on androgen deprivation therapy alone with excellent PSA response.  The natural course of his disease was reviewed and additional treatment choices including systemic chemotherapy and androgen receptor pathway inhibitors has been deferred at this time.      His disease is chronic and not in remission.  We will develop castration resistant disease and additional therapy will be required in the future.   2.  Androgen deprivation therapy: He will receive Eligard today and repeated in 4 months.  Complications including weight gain and hot flashes among others were reviewed.  He is agreeable to continue.   3. Diabetes mellitus: Blood sugar remains stable without any exacerbation on current therapy.  4.  Anemia: Hemoglobin continues to be at baseline without any changes.  This is likely related to chronic kidney disease and malignancy.   5. Followup: In 4 months for repeat follow-up.  30 minutes were spent on this visit.  The time was dedicated to reviewing laboratory data, treatment choices and outlining future plan of care.    Zola Button, MD 10/20/20221:21 PM

## 2021-06-18 NOTE — Addendum Note (Signed)
Addended by: Rolland Bimler on: 06/18/2021 04:33 PM   Modules accepted: Orders

## 2021-09-09 ENCOUNTER — Telehealth: Payer: Self-pay | Admitting: Oncology

## 2021-09-09 NOTE — Telephone Encounter (Signed)
R/s per 1/10 inbasket, left msg

## 2021-09-28 ENCOUNTER — Other Ambulatory Visit (INDEPENDENT_AMBULATORY_CARE_PROVIDER_SITE_OTHER): Payer: Medicare PPO

## 2021-09-28 ENCOUNTER — Other Ambulatory Visit: Payer: Self-pay

## 2021-09-28 DIAGNOSIS — E119 Type 2 diabetes mellitus without complications: Secondary | ICD-10-CM | POA: Diagnosis not present

## 2021-09-28 DIAGNOSIS — E782 Mixed hyperlipidemia: Secondary | ICD-10-CM | POA: Diagnosis not present

## 2021-09-28 LAB — COMPREHENSIVE METABOLIC PANEL
ALT: 11 U/L (ref 0–53)
AST: 15 U/L (ref 0–37)
Albumin: 4.1 g/dL (ref 3.5–5.2)
Alkaline Phosphatase: 52 U/L (ref 39–117)
BUN: 28 mg/dL — ABNORMAL HIGH (ref 6–23)
CO2: 28 mEq/L (ref 19–32)
Calcium: 9.3 mg/dL (ref 8.4–10.5)
Chloride: 105 mEq/L (ref 96–112)
Creatinine, Ser: 1.5 mg/dL (ref 0.40–1.50)
GFR: 44.88 mL/min — ABNORMAL LOW (ref >60.00)
Glucose, Bld: 75 mg/dL (ref 70–99)
Potassium: 4.8 mEq/L (ref 3.5–5.1)
Sodium: 140 mEq/L (ref 135–145)
Total Bilirubin: 0.5 mg/dL (ref 0.2–1.2)
Total Protein: 6.2 g/dL (ref 6.0–8.3)

## 2021-09-28 LAB — HEMOGLOBIN A1C: Hgb A1c MFr Bld: 6 % (ref 4.6–6.5)

## 2021-09-28 LAB — LIPID PANEL
Cholesterol: 162 mg/dL (ref 0–200)
HDL: 73.8 mg/dL (ref >39.00)
LDL Cholesterol: 70 mg/dL (ref 0–99)
NonHDL: 88.09
Total CHOL/HDL Ratio: 2
Triglycerides: 89 mg/dL (ref 0.0–149.0)
VLDL: 17.8 mg/dL (ref 0.0–40.0)

## 2021-09-28 LAB — MICROALBUMIN / CREATININE URINE RATIO
Creatinine,U: 208.8 mg/dL
Microalb Creat Ratio: 4.6 mg/g (ref 0.0–30.0)
Microalb, Ur: 9.5 mg/dL — ABNORMAL HIGH (ref 0.0–1.9)

## 2021-09-29 ENCOUNTER — Other Ambulatory Visit: Payer: Medicare PPO

## 2021-10-01 NOTE — Progress Notes (Signed)
Patient ID: Richard [name], male   DOB: 1946-01-19, 76 y.o.   MRN: [id number]    Reason for Appointment:  Follow-up visit  History of Present Illness   Problem 1:   DIABETES MELITUS, date of diagnosis 1995 with a glucose of 320 and HemoglobinA1c of 9.6 Previous history: He has been on various oral hypoglycemic drugs in the past but for the last few years has required multiple drugs in combination; diabetes has been generally well controlled with A1c near normal  Recent history:   Oral hypoglycemic drugs: Amaryl 2 mg once a day in a.m., metformin 1000 twice a day, Actos 30 mg daily  His A1c is about the same at 6%   Current management, blood sugar patterns and problems identified: He has checked his blood sugars again infrequently  However blood sugars appear to be fairly consistently near normal and he thinks his highest reading is only about 160 His medications have been continued unchanged for some time May not be as active this winter but is trying to do some exercise on his elliptical and weights Has gained about 4 pounds since his last visit Lab fasting glucose 129   Side effects from medications: None       Monitors blood glucose:  irregularly     Glucometer: One Touch ultra 2           Home blood Glucose readings from meter review:    PRE-MEAL Fasting Lunch Dinner Bedtime Overall  Glucose range: 87 94, 161  93-115   Mean/median:        PREVIOUS average 118 with only about 5 recent readings  Dietician visit: Most recent:? 2007    Weight control:      Wt Readings from Last 3 Encounters:  10/02/21 214 lb (97.1 kg)  06/18/21 213 lb 3.2 oz (96.7 kg)  04/30/21 209 lb 9.6 oz (95.1 kg)    Diabetes labs:  Lab Results  Component Value Date   HGBA1C 6.0 09/28/2021   HGBA1C 6.1 03/30/2021   HGBA1C 6.0 08/04/2020   Lab Results  Component Value Date   MICROALBUR 9.5 (H) 09/28/2021   LDLCALC 70 09/28/2021   CREATININE 1.50 09/28/2021    Other  problems addressed today: See review of systems   Lab on 09/28/2021  Component Date Value Ref Range Status   Cholesterol 09/28/2021 162  0 - 200 mg/dL Final   ATP III Classification       Desirable:  < 200 mg/dL               Borderline High:  200 - 239 mg/dL          High:  > = 240 mg/dL   Triglycerides 09/28/2021 89.0  0.0 - 149.0 mg/dL Final   Normal:  <150 mg/dLBorderline High:  150 - 199 mg/dL   HDL 09/28/2021 73.80  >39.00 mg/dL Final   VLDL 09/28/2021 17.8  0.0 - 40.0 mg/dL Final   LDL Cholesterol 09/28/2021 70  0 - 99 mg/dL Final   Total CHOL/HDL Ratio 09/28/2021 2   Final                  Men          Women1/2 Average Risk     3.4          3.3Average Risk          5.0          4.42X Average Risk  9.6          7.13X Average Risk          15.0          11.0                       NonHDL 09/28/2021 88.09   Final   NOTE:  Non-HDL goal should be 30 mg/dL higher than patient's LDL goal (i.e. LDL goal of < 70 mg/dL, would have non-HDL goal of < 100 mg/dL)   Microalb, Ur 09/28/2021 9.5 (H)  0.0 - 1.9 mg/dL Final   Creatinine,U 09/28/2021 208.8  mg/dL Final   Microalb Creat Ratio 09/28/2021 4.6  0.0 - 30.0 mg/g Final   Sodium 09/28/2021 140  135 - 145 mEq/L Final   Potassium 09/28/2021 4.8  3.5 - 5.1 mEq/L Final   Chloride 09/28/2021 105  96 - 112 mEq/L Final   CO2 09/28/2021 28  19 - 32 mEq/L Final   Glucose, Bld 09/28/2021 75  70 - 99 mg/dL Final   BUN 09/28/2021 28 (H)  6 - 23 mg/dL Final   Creatinine, Ser 09/28/2021 1.50  0.40 - 1.50 mg/dL Final   Total Bilirubin 09/28/2021 0.5  0.2 - 1.2 mg/dL Final   Alkaline Phosphatase 09/28/2021 52  39 - 117 U/L Final   AST 09/28/2021 15  0 - 37 U/L Final   ALT 09/28/2021 11  0 - 53 U/L Final   Total Protein 09/28/2021 6.2  6.0 - 8.3 g/dL Final   Albumin 09/28/2021 4.1  3.5 - 5.2 g/dL Final   GFR 09/28/2021 44.88 (L)  >60.00 mL/min Final   Calculated using the CKD-EPI Creatinine Equation (2021)   Calcium 09/28/2021 9.3  8.4 - 10.5  mg/dL Final   Hgb A1c MFr Bld 09/28/2021 6.0  4.6 - 6.5 % Final   Glycemic Control Guidelines for People with Diabetes:Non Diabetic:  <6%Goal of Therapy: <7%Additional Action Suggested:  >8%      Allergies as of 10/02/2021       Reactions   Effexor Xr [venlafaxine Hcl] Rash        Medication List        Accurate as of October 02, 2021  8:19 AM. If you have any questions, ask your nurse or doctor.          Accu-Chek Guide test strip Generic drug: glucose blood Use to check blood sugars daily   Accu-Chek Guide w/Device Kit Use to check blood sugars daily   amLODipine 10 MG tablet Commonly known as: NORVASC Take 1 tablet (10 mg total) by mouth daily.   atorvastatin 10 MG tablet Commonly known as: LIPITOR TAKE 1 TABLET(10 MG) BY MOUTH DAILY   glimepiride 2 MG tablet Commonly known as: AMARYL TAKE 1 TABLET (2 MG TOTAL) BY MOUTH DAILY WITH BREAKFAST.   hydrALAZINE 10 MG tablet Commonly known as: APRESOLINE Take 10 mg by mouth daily.   losartan 100 MG tablet Commonly known as: COZAAR Take 1 tablet by mouth daily. What changed: Another medication with the same name was removed. Continue taking this medication, and follow the directions you see here. Changed by: Elayne Snare, MD   LUPRON DEPOT (4-MONTH) IM Inject into the muscle.   metFORMIN 1000 MG tablet Commonly known as: GLUCOPHAGE Take 1 tablet by mouth in the morning and at bedtime. What changed: Another medication with the same name was removed. Continue taking this medication, and follow the directions you see here. Changed by:  Elayne Snare, MD   Myrbetriq 50 MG Tb24 tablet Generic drug: mirabegron ER Take 1 tablet by mouth daily.   onetouch ultrasoft lancets Use as instructed to check blood sugar 2 times daily   pioglitazone 30 MG tablet Commonly known as: ACTOS TAKE 1 TABLET(30 MG) BY MOUTH DAILY   vitamin B-12 500 MCG tablet Commonly known as: CYANOCOBALAMIN Take 2 tablets by mouth daily.         Allergies:  Allergies  Allergen Reactions   Effexor Xr [Venlafaxine Hcl] Rash    Past Medical History:  Diagnosis Date   Diabetes mellitus without complication ([place])    Hypertension    prostate ca dx'd 2006   prostatectomy and xrt    No past surgical history on file.  Family History  Problem Relation Age of Onset   Hypertension Father    Heart disease Neg Hx    Diabetes Neg Hx     Social History:  reports that he has been smoking. He has never used smokeless tobacco. No history on file for alcohol use and drug use.    REVIEW OF SYSTEMS:       Hypertension: Was on 25 mg losartan previously and this was not increased because of high potassium and abnormal renal function study However his PCP has increased the dose to 100 mg and added hydralazine 10 mg twice daily because of blood pressure readings in the 096-283 systolic range late last year  Also on amlodipine 10 mg  He is now monitoring blood pressure at home, he thinks that most of his systolic readings are in the 130s   BP Readings from Last 3 Encounters:  10/02/21 140/80  06/18/21 (!) 153/67  04/30/21 (!) 148/80    Has relatively high creatinine levels for some time Creatinine is about the same more recently  Tends to have high normal potassium levels at times but this is still stable now using with 100 mg losartan   Lab Results  Component Value Date   CREATININE 1.50 09/28/2021   CREATININE 1.50 (H) 06/16/2021   CREATININE 1.49 04/27/2021   Lab Results  Component Value Date   K 4.8 09/28/2021     Lipids:  he has been treated with Lipitor 10 mg which he takes daily LDL is consistently around 70 Recently triglycerides normal  Lab Results  Component Value Date   CHOL 162 09/28/2021   CHOL 160 04/27/2021   CHOL 169 03/30/2021   Lab Results  Component Value Date   HDL 73.80 09/28/2021   HDL 59.90 04/27/2021   HDL 57.10 03/30/2021   Lab Results  Component Value Date   LDLCALC 70  09/28/2021   LDLCALC 72 04/27/2021   LDLCALC 73 08/04/2020   Lab Results  Component Value Date   TRIG 89.0 09/28/2021   TRIG 142.0 04/27/2021   TRIG 255.0 (H) 03/30/2021   Lab Results  Component Value Date   CHOLHDL 2 09/28/2021   CHOLHDL 3 04/27/2021   CHOLHDL 3 03/30/2021   Lab Results  Component Value Date   LDLDIRECT 84.0 03/30/2021      History of vitamin B12 deficiency on supplement Has mild chronic anemia His last B12 level is normal  CBC followed by oncologist    Last eye exam was in 2022 with the [facility] but does not appear to have had fundus exam  Last foot exam 8/22  LABS:  Lab on 09/28/2021  Component Date Value Ref Range Status   Cholesterol 09/28/2021 162  0 -  200 mg/dL Final   ATP III Classification       Desirable:  < 200 mg/dL               Borderline High:  200 - 239 mg/dL          High:  > = 240 mg/dL   Triglycerides 09/28/2021 89.0  0.0 - 149.0 mg/dL Final   Normal:  <150 mg/dLBorderline High:  150 - 199 mg/dL   HDL 09/28/2021 73.80  >39.00 mg/dL Final   VLDL 09/28/2021 17.8  0.0 - 40.0 mg/dL Final   LDL Cholesterol 09/28/2021 70  0 - 99 mg/dL Final   Total CHOL/HDL Ratio 09/28/2021 2   Final                  Men          Women1/2 Average Risk     3.4          3.3Average Risk          5.0          4.42X Average Risk          9.6          7.13X Average Risk          15.0          11.0                       NonHDL 09/28/2021 88.09   Final   NOTE:  Non-HDL goal should be 30 mg/dL higher than patient's LDL goal (i.e. LDL goal of < 70 mg/dL, would have non-HDL goal of < 100 mg/dL)   Microalb, Ur 09/28/2021 9.5 (H)  0.0 - 1.9 mg/dL Final   Creatinine,U 09/28/2021 208.8  mg/dL Final   Microalb Creat Ratio 09/28/2021 4.6  0.0 - 30.0 mg/g Final   Sodium 09/28/2021 140  135 - 145 mEq/L Final   Potassium 09/28/2021 4.8  3.5 - 5.1 mEq/L Final   Chloride 09/28/2021 105  96 - 112 mEq/L Final   CO2 09/28/2021 28  19 - 32 mEq/L Final   Glucose, Bld 09/28/2021 75   70 - 99 mg/dL Final   BUN 09/28/2021 28 (H)  6 - 23 mg/dL Final   Creatinine, Ser 09/28/2021 1.50  0.40 - 1.50 mg/dL Final   Total Bilirubin 09/28/2021 0.5  0.2 - 1.2 mg/dL Final   Alkaline Phosphatase 09/28/2021 52  39 - 117 U/L Final   AST 09/28/2021 15  0 - 37 U/L Final   ALT 09/28/2021 11  0 - 53 U/L Final   Total Protein 09/28/2021 6.2  6.0 - 8.3 g/dL Final   Albumin 09/28/2021 4.1  3.5 - 5.2 g/dL Final   GFR 09/28/2021 44.88 (L)  >60.00 mL/min Final   Calculated using the CKD-EPI Creatinine Equation (2021)   Calcium 09/28/2021 9.3  8.4 - 10.5 mg/dL Final   Hgb A1c MFr Bld 09/28/2021 6.0  4.6 - 6.5 % Final   Glycemic Control Guidelines for People with Diabetes:Non Diabetic:  <6%Goal of Therapy: <7%Additional Action Suggested:  >8%      Examination:   BP 140/80 (BP Location: Left Arm, Patient Position: Sitting, Cuff Size: Small)    Pulse 70    Ht 5' 10.5" (1.791 m)    Wt 214 lb (97.1 kg)    SpO2 96%    BMI 30.27 kg/m   Body mass index is 30.27 kg/m.    ASSESSMENT/ PLAN:  Diabetes type 2 with mild obesity:    He is on Actos, Amaryl and Metformin long-term  A1c is very consistent at about 6%  No side effects from metformin or Actos and low hypoglycemia from Amaryl He is not quite as active this winter but has not had significantly high sugars at all  He will continue the same regimen Try to exercise consistently  Hypertension: Blood pressure is better controlled and now managed by PCP with 3 drugs No recurrence of hyperkalemia even with 100 mg losartan  Encouraged him to continue monitoring at home   LIPIDS: LDL well-controlled on atorvastatin, triglycerides normal also   Elayne Snare 10/02/2021, 8:19 AM     Note: This office note was prepared with Dragon voice recognition system technology. Any transcriptional errors that result from this process are unintentional.   Note: This office note was prepared with Estate agent. Any  transcriptional errors that result from this process are unintentional.

## 2021-10-02 ENCOUNTER — Ambulatory Visit: Payer: Medicare PPO | Admitting: Endocrinology

## 2021-10-02 ENCOUNTER — Encounter: Payer: Self-pay | Admitting: Endocrinology

## 2021-10-02 ENCOUNTER — Other Ambulatory Visit: Payer: Self-pay

## 2021-10-02 VITALS — BP 140/80 | HR 70 | Ht 70.5 in | Wt 214.0 lb

## 2021-10-02 DIAGNOSIS — E119 Type 2 diabetes mellitus without complications: Secondary | ICD-10-CM | POA: Diagnosis not present

## 2021-10-02 DIAGNOSIS — E7800 Pure hypercholesterolemia, unspecified: Secondary | ICD-10-CM

## 2021-10-02 DIAGNOSIS — I10 Essential (primary) hypertension: Secondary | ICD-10-CM | POA: Diagnosis not present

## 2021-10-14 ENCOUNTER — Other Ambulatory Visit: Payer: Self-pay

## 2021-10-14 ENCOUNTER — Inpatient Hospital Stay: Payer: Medicare PPO | Attending: Oncology

## 2021-10-14 DIAGNOSIS — C61 Malignant neoplasm of prostate: Secondary | ICD-10-CM | POA: Insufficient documentation

## 2021-10-14 DIAGNOSIS — Z5111 Encounter for antineoplastic chemotherapy: Secondary | ICD-10-CM | POA: Diagnosis not present

## 2021-10-14 DIAGNOSIS — D649 Anemia, unspecified: Secondary | ICD-10-CM | POA: Insufficient documentation

## 2021-10-14 DIAGNOSIS — E119 Type 2 diabetes mellitus without complications: Secondary | ICD-10-CM | POA: Diagnosis not present

## 2021-10-14 LAB — CBC WITH DIFFERENTIAL (CANCER CENTER ONLY)
Abs Immature Granulocytes: 0.01 10*3/uL (ref 0.00–0.07)
Basophils Absolute: 0.1 10*3/uL (ref 0.0–0.1)
Basophils Relative: 1 %
Eosinophils Absolute: 0.1 10*3/uL (ref 0.0–0.5)
Eosinophils Relative: 2 %
HCT: 35.4 % — ABNORMAL LOW (ref 39.0–52.0)
Hemoglobin: 11.8 g/dL — ABNORMAL LOW (ref 13.0–17.0)
Immature Granulocytes: 0 %
Lymphocytes Relative: 28 %
Lymphs Abs: 1.6 10*3/uL (ref 0.7–4.0)
MCH: 32.2 pg (ref 26.0–34.0)
MCHC: 33.3 g/dL (ref 30.0–36.0)
MCV: 96.7 fL (ref 80.0–100.0)
Monocytes Absolute: 0.6 10*3/uL (ref 0.1–1.0)
Monocytes Relative: 10 %
Neutro Abs: 3.3 10*3/uL (ref 1.7–7.7)
Neutrophils Relative %: 59 %
Platelet Count: 211 10*3/uL (ref 150–400)
RBC: 3.66 MIL/uL — ABNORMAL LOW (ref 4.22–5.81)
RDW: 12.7 % (ref 11.5–15.5)
WBC Count: 5.6 10*3/uL (ref 4.0–10.5)
nRBC: 0 % (ref 0.0–0.2)

## 2021-10-14 LAB — CMP (CANCER CENTER ONLY)
ALT: 14 U/L (ref 0–44)
AST: 19 U/L (ref 15–41)
Albumin: 4.2 g/dL (ref 3.5–5.0)
Alkaline Phosphatase: 52 U/L (ref 38–126)
Anion gap: 5 (ref 5–15)
BUN: 29 mg/dL — ABNORMAL HIGH (ref 8–23)
CO2: 27 mmol/L (ref 22–32)
Calcium: 9.3 mg/dL (ref 8.9–10.3)
Chloride: 107 mmol/L (ref 98–111)
Creatinine: 1.3 mg/dL — ABNORMAL HIGH (ref 0.61–1.24)
GFR, Estimated: 57 mL/min — ABNORMAL LOW (ref >60)
Glucose, Bld: 82 mg/dL (ref 70–99)
Potassium: 4.9 mmol/L (ref 3.5–5.1)
Sodium: 139 mmol/L (ref 135–145)
Total Bilirubin: 0.4 mg/dL (ref 0.3–1.2)
Total Protein: 6.8 g/dL (ref 6.5–8.1)

## 2021-10-15 ENCOUNTER — Other Ambulatory Visit: Payer: Medicare PPO

## 2021-10-15 LAB — PROSTATE-SPECIFIC AG, SERUM (LABCORP): Prostate Specific Ag, Serum: <0.1 ng/mL (ref 0.0–4.0)

## 2021-10-22 ENCOUNTER — Other Ambulatory Visit: Payer: Self-pay

## 2021-10-22 ENCOUNTER — Inpatient Hospital Stay: Payer: Medicare PPO

## 2021-10-22 ENCOUNTER — Inpatient Hospital Stay: Payer: Medicare PPO | Admitting: Oncology

## 2021-10-22 VITALS — BP 144/62 | HR 75 | Temp 97.5°F | Resp 20

## 2021-10-22 DIAGNOSIS — C61 Malignant neoplasm of prostate: Secondary | ICD-10-CM | POA: Diagnosis not present

## 2021-10-22 DIAGNOSIS — Z8546 Personal history of malignant neoplasm of prostate: Secondary | ICD-10-CM

## 2021-10-22 DIAGNOSIS — Z5111 Encounter for antineoplastic chemotherapy: Secondary | ICD-10-CM | POA: Diagnosis not present

## 2021-10-22 MED ORDER — LEUPROLIDE ACETATE (4 MONTH) 30 MG ~~LOC~~ KIT
30.0000 mg | PACK | Freq: Once | SUBCUTANEOUS | Status: AC
  Administered 2021-10-22: 30 mg via SUBCUTANEOUS
  Filled 2021-10-22: qty 30

## 2021-10-22 NOTE — Progress Notes (Signed)
Hematology and Oncology Follow Up Visit  Richard [name] [id number] 1946-05-24 77 y.o. 10/22/2021 12:45 PM    Principle Diagnosis: 76 year old man with prostate cancer diagnosed in 2006 with a Gleason score 4+3 equal 7 and a PSA of 3.66.  He developed castration-sensitive with biochemical relapse in 2016.   Prior Therapy: 1. He is status post retropubic prostatectomy and bilateral lymph node dissection done on 11/25/2004. The pathology showed a Gleason score of 4+4 equals 8 with staging of T2b N0.  2. Sstatus post salvage radiation therapy done between March and May of 2007 for a rise in his PSA to 0.29. He received total of 65 gray in 35 fractions. 3.  He was treated with intermittent Lupron until 2016.  Lupron has been given at that time.  Current therapy:   Eligard 30 mg every 4 months started in April 2016.  Next injection will be given today and every 4 months.  Interim History: [name] returns today for repeat evaluation.  Since her last visit, he reports no major changes in his health.  He continues to tolerate hormone therapy without any complaints.  He denies any nausea, vomiting or abdominal pain.  He denies any hospitalizations or illnesses.  He denies any weight loss or appetite changes.  He denies any excessive fatigue or tiredness.  He denies any hot flashes or urinary symptoms.  He did have issues with incontinence that has improved with Myrbetriq.           Medications: Updated on review.  Current Outpatient Medications on File Prior to Visit  Medication Sig Dispense Refill   amLODipine (NORVASC) 10 MG tablet Take 1 tablet (10 mg total) by mouth daily. 90 tablet 2   atorvastatin (LIPITOR) 10 MG tablet TAKE 1 TABLET(10 MG) BY MOUTH DAILY 90 tablet 0   Blood Glucose Monitoring Suppl (ACCU-CHEK GUIDE) w/Device KIT Use to check blood sugars daily 1 kit 0   glimepiride (AMARYL) 2 MG tablet TAKE 1 TABLET (2 MG TOTAL) BY MOUTH DAILY WITH BREAKFAST. 90 tablet 0    glucose blood (ACCU-CHEK GUIDE) test strip Use to check blood sugars daily 100 each 2   hydrALAZINE (APRESOLINE) 10 MG tablet Take 10 mg by mouth daily.     Lancets (ONETOUCH ULTRASOFT) lancets Use as instructed to check blood sugar 2 times daily 100 each 11   Leuprolide Acetate (LUPRON DEPOT, 48-MONTH, IM) Inject into the muscle.     losartan (COZAAR) 100 MG tablet Take 1 tablet by mouth daily.     metFORMIN (GLUCOPHAGE) 1000 MG tablet Take 1 tablet by mouth in the morning and at bedtime.     MYRBETRIQ 50 MG TB24 tablet Take 1 tablet by mouth daily.     pioglitazone (ACTOS) 30 MG tablet TAKE 1 TABLET(30 MG) BY MOUTH DAILY 90 tablet 0   vitamin B-12 (CYANOCOBALAMIN) 500 MCG tablet Take 2 tablets by mouth daily.     No current facility-administered medications on file prior to visit.     Allergies:  Allergies  Allergen Reactions   Effexor Xr [Venlafaxine Hcl] Rash      . Physical Exam:    Blood pressure (!) 144/62, pulse 75, temperature (!) 97.5 F (36.4 C), resp. rate 20, SpO2 100 %.        ECOG: 0    General appearance: Comfortable appearing without any discomfort Head: Normocephalic without any trauma Oropharynx: Mucous membranes are moist and pink without any thrush or ulcers. Eyes: Pupils are equal and round reactive  to light. Lymph nodes: No cervical, supraclavicular, inguinal or axillary lymphadenopathy.   Heart:regular rate and rhythm.  S1 and S2 without leg edema. Lung: Clear without any rhonchi or wheezes.  No dullness to percussion. Abdomin: Soft, nontender, nondistended with good bowel sounds.  No hepatosplenomegaly. Musculoskeletal: No joint deformity or effusion.  Full range of motion noted. Neurological: No deficits noted on motor, sensory and deep tendon reflex exam. Skin: No petechial rash or dryness.  Appeared moist.             Lab Results: Lab Results  Component Value Date   WBC 5.6 10/14/2021   HGB 11.8 (L) 10/14/2021   HCT 35.4  (L) 10/14/2021   MCV 96.7 10/14/2021   PLT 211 10/14/2021     Chemistry      Component Value Date/Time   NA 139 10/14/2021 0923   NA 140 05/24/2017 0810   K 4.9 10/14/2021 0923   K 4.4 05/24/2017 0810   CL 107 10/14/2021 0923   CL 102 02/20/2013 1254   CO2 27 10/14/2021 0923   CO2 25 05/24/2017 0810   BUN 29 (H) 10/14/2021 0923   BUN 24.7 05/24/2017 0810   CREATININE 1.30 (H) 10/14/2021 0923   CREATININE 1.2 05/24/2017 0810      Component Value Date/Time   CALCIUM 9.3 10/14/2021 0923   CALCIUM 9.3 05/24/2017 0810   ALKPHOS 52 10/14/2021 0923   ALKPHOS 60 05/24/2017 0810   AST 19 10/14/2021 0923   AST 14 05/24/2017 0810   ALT 14 10/14/2021 0923   ALT 9 05/24/2017 0810   BILITOT 0.4 10/14/2021 0923   BILITOT 0.41 05/24/2017 0810        Latest Reference Range & Units 06/16/21 12:52 10/14/21 09:23  Prostate Specific Ag, Serum 0.0 - 4.0 ng/mL <0.1 <0.1            Impression and Plan:  76 year old man with:   1.  Prostate cancer diagnosed in 2006.  He developed castration-sensitive in 2016 with biochemical relapse only.  His PSA continues to be undetectable on androgen deprivation therapy alone.  Alternative treatment options receptor pathway inhibitors, systemic chemotherapy among others will be deferred unless he develops measurable disease in the future.  He is agreeable to continue at this time.       2.  Androgen deprivation therapy: Risks and benefits of continuing androgen deprivation therapy alone were discussed.  Complications including weight gain, hot flashes, neurological injury indurated.  He is agreeable to continue at this time.   3. Diabetes mellitus: No recent exacerbation related to his blood sugar.  4.  Anemia: Hemoglobin is 11.8 and remains overall stable.  No need for work-up or intervention at this time.  His anemia is likely related to chronic disease and renal insufficiency.   5. Followup: He will return in 4 months for a follow-up  visit.  30 minutes were dedicated to this encounter.  The time was spent on reviewing laboratory data, disease status update and outlining future plan of care.    Zola Button, MD 2/23/202312:45 PM

## 2021-10-30 ENCOUNTER — Telehealth: Payer: Self-pay | Admitting: Oncology

## 2021-10-30 NOTE — Telephone Encounter (Signed)
Scheduled per los, called and spoke with patient's spouse. Patient will be notified of upcoming appointment. ?

## 2022-02-18 ENCOUNTER — Inpatient Hospital Stay: Payer: Medicare PPO | Attending: Oncology

## 2022-02-18 ENCOUNTER — Other Ambulatory Visit: Payer: Self-pay

## 2022-02-18 DIAGNOSIS — D649 Anemia, unspecified: Secondary | ICD-10-CM | POA: Diagnosis not present

## 2022-02-18 DIAGNOSIS — E119 Type 2 diabetes mellitus without complications: Secondary | ICD-10-CM | POA: Insufficient documentation

## 2022-02-18 DIAGNOSIS — Z5111 Encounter for antineoplastic chemotherapy: Secondary | ICD-10-CM | POA: Insufficient documentation

## 2022-02-18 DIAGNOSIS — C61 Malignant neoplasm of prostate: Secondary | ICD-10-CM | POA: Diagnosis present

## 2022-02-18 LAB — CMP (CANCER CENTER ONLY)
ALT: 12 U/L (ref 0–44)
AST: 16 U/L (ref 15–41)
Albumin: 4.2 g/dL (ref 3.5–5.0)
Alkaline Phosphatase: 50 U/L (ref 38–126)
Anion gap: 4 — ABNORMAL LOW (ref 5–15)
BUN: 30 mg/dL — ABNORMAL HIGH (ref 8–23)
CO2: 30 mmol/L (ref 22–32)
Calcium: 9.8 mg/dL (ref 8.9–10.3)
Chloride: 106 mmol/L (ref 98–111)
Creatinine: 1.57 mg/dL — ABNORMAL HIGH (ref 0.61–1.24)
GFR, Estimated: 45 mL/min — ABNORMAL LOW (ref >60)
Glucose, Bld: 91 mg/dL (ref 70–99)
Potassium: 4.9 mmol/L (ref 3.5–5.1)
Sodium: 140 mmol/L (ref 135–145)
Total Bilirubin: 0.5 mg/dL (ref 0.3–1.2)
Total Protein: 6.8 g/dL (ref 6.5–8.1)

## 2022-02-18 LAB — CBC WITH DIFFERENTIAL (CANCER CENTER ONLY)
Abs Immature Granulocytes: 0.01 10*3/uL (ref 0.00–0.07)
Basophils Absolute: 0.1 10*3/uL (ref 0.0–0.1)
Basophils Relative: 1 %
Eosinophils Absolute: 0.1 10*3/uL (ref 0.0–0.5)
Eosinophils Relative: 2 %
HCT: 36.6 % — ABNORMAL LOW (ref 39.0–52.0)
Hemoglobin: 12 g/dL — ABNORMAL LOW (ref 13.0–17.0)
Immature Granulocytes: 0 %
Lymphocytes Relative: 28 %
Lymphs Abs: 1.5 10*3/uL (ref 0.7–4.0)
MCH: 32.5 pg (ref 26.0–34.0)
MCHC: 32.8 g/dL (ref 30.0–36.0)
MCV: 99.2 fL (ref 80.0–100.0)
Monocytes Absolute: 0.6 10*3/uL (ref 0.1–1.0)
Monocytes Relative: 12 %
Neutro Abs: 3.2 10*3/uL (ref 1.7–7.7)
Neutrophils Relative %: 57 %
Platelet Count: 213 10*3/uL (ref 150–400)
RBC: 3.69 MIL/uL — ABNORMAL LOW (ref 4.22–5.81)
RDW: 12.7 % (ref 11.5–15.5)
WBC Count: 5.5 10*3/uL (ref 4.0–10.5)
nRBC: 0 % (ref 0.0–0.2)

## 2022-02-19 LAB — PROSTATE-SPECIFIC AG, SERUM (LABCORP): Prostate Specific Ag, Serum: <0.1 ng/mL (ref 0.0–4.0)

## 2022-02-25 ENCOUNTER — Inpatient Hospital Stay: Payer: Medicare PPO | Admitting: Oncology

## 2022-02-25 ENCOUNTER — Inpatient Hospital Stay: Payer: Medicare PPO

## 2022-02-25 ENCOUNTER — Other Ambulatory Visit: Payer: Self-pay

## 2022-02-25 VITALS — BP 141/70 | HR 67 | Temp 97.7°F | Resp 15 | Wt 214.4 lb

## 2022-02-25 DIAGNOSIS — Z5111 Encounter for antineoplastic chemotherapy: Secondary | ICD-10-CM | POA: Diagnosis not present

## 2022-02-25 DIAGNOSIS — Z8546 Personal history of malignant neoplasm of prostate: Secondary | ICD-10-CM

## 2022-02-25 DIAGNOSIS — C61 Malignant neoplasm of prostate: Secondary | ICD-10-CM | POA: Diagnosis not present

## 2022-02-25 MED ORDER — LEUPROLIDE ACETATE (4 MONTH) 30 MG ~~LOC~~ KIT
30.0000 mg | PACK | Freq: Once | SUBCUTANEOUS | Status: AC
  Administered 2022-02-25: 30 mg via SUBCUTANEOUS
  Filled 2022-02-25: qty 30

## 2022-02-25 NOTE — Progress Notes (Signed)
Hematology and Oncology Follow Up Visit  Richard [name] [id number] 1946-03-29 76 y.o. 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  He initially was diagnosed in 2006 with a Gleason score 4+3 equal 7 and a PSA of 3.66.   Prior Therapy: 1. He is status post retropubic prostatectomy and bilateral lymph node dissection done on 11/25/2004. The pathology showed a Gleason score of 4+4 equals 8 with staging of T2b N0.  2. Sstatus post salvage radiation therapy done between March and May of 2007 for a rise in his PSA to 0.29. He received total of 65 gray in 35 fractions. 3.  He was treated with intermittent Lupron until 2016.  Lupron has been given at that time.  Current therapy:   Eligard 30 mg every 4 months started in April 2016.  This will be repeated today and in 4 months.  Interim History: Richard [name] returns today for a follow-up visit.  Since last visit, he reports feeling well without any major complaints.  He tolerated Eligard injection without any complaints.  He denies any nausea, fatigue or muscle pain.  He denies any bone pain or pathological fractures.  His performance status quality of life remains unchanged.           Medications: Reviewed without changes.  Current Outpatient Medications on File Prior to Visit  Medication Sig Dispense Refill   amLODipine (NORVASC) 10 MG tablet Take 1 tablet (10 mg total) by mouth daily. 90 tablet 2   atorvastatin (LIPITOR) 10 MG tablet TAKE 1 TABLET(10 MG) BY MOUTH DAILY 90 tablet 0   Blood Glucose Monitoring Suppl (ACCU-CHEK GUIDE) w/Device KIT Use to check blood sugars daily 1 kit 0   glimepiride (AMARYL) 2 MG tablet TAKE 1 TABLET (2 MG TOTAL) BY MOUTH DAILY WITH BREAKFAST. 90 tablet 0   glucose blood (ACCU-CHEK GUIDE) test strip Use to check blood sugars daily 100 each 2   hydrALAZINE (APRESOLINE) 10 MG tablet Take 10 mg by mouth  daily.     Lancets (ONETOUCH ULTRASOFT) lancets Use as instructed to check blood sugar 2 times daily 100 each 11   Leuprolide Acetate (LUPRON DEPOT, 59-MONTH, IM) Inject into the muscle.     losartan (COZAAR) 100 MG tablet Take 1 tablet by mouth daily.     metFORMIN (GLUCOPHAGE) 1000 MG tablet Take 1 tablet by mouth in the morning and at bedtime.     MYRBETRIQ 50 MG TB24 tablet Take 1 tablet by mouth daily.     pioglitazone (ACTOS) 30 MG tablet TAKE 1 TABLET(30 MG) BY MOUTH DAILY 90 tablet 0   vitamin B-12 (CYANOCOBALAMIN) 500 MCG tablet Take 2 tablets by mouth daily.     No current facility-administered medications on file prior to visit.     Allergies:  Allergies  Allergen Reactions   Effexor Xr [Venlafaxine Hcl] Rash      . Physical Exam:        Blood pressure (!) 141/70, pulse 67, temperature 97.7 F (36.5 C), temperature source Oral, resp. rate 15, weight 214 lb 6.4 oz (97.3 kg), SpO2 99 %.     ECOG: 0    General appearance: Alert, awake without any distress. Head: Atraumatic without abnormalities Oropharynx: Without any thrush or ulcers. Eyes: No scleral icterus. Lymph nodes: No lymphadenopathy noted in the cervical, supraclavicular, or axillary nodes Heart:regular rate and rhythm, without any murmurs or gallops.   Lung: Clear to auscultation  without any rhonchi, wheezes or dullness to percussion. Abdomin: Soft, nontender without any shifting dullness or ascites. Musculoskeletal: No clubbing or cyanosis. Neurological: No motor or sensory deficits. Skin: No rashes or lesions.              Lab Results: Lab Results  Component Value Date   WBC 5.5 02/18/2022   HGB 12.0 (L) 02/18/2022   HCT 36.6 (L) 02/18/2022   MCV 99.2 02/18/2022   PLT 213 02/18/2022     Chemistry      Component Value Date/Time   NA 140 02/18/2022 1059   NA 140 05/24/2017 0810   K 4.9 02/18/2022 1059   K 4.4 05/24/2017 0810   CL 106 02/18/2022 1059   CL 102 02/20/2013  1254   CO2 30 02/18/2022 1059   CO2 25 05/24/2017 0810   BUN 30 (H) 02/18/2022 1059   BUN 24.7 05/24/2017 0810   CREATININE 1.57 (H) 02/18/2022 1059   CREATININE 1.2 05/24/2017 0810      Component Value Date/Time   CALCIUM 9.8 02/18/2022 1059   CALCIUM 9.3 05/24/2017 0810   ALKPHOS 50 02/18/2022 1059   ALKPHOS 60 05/24/2017 0810   AST 16 02/18/2022 1059   AST 14 05/24/2017 0810   ALT 12 02/18/2022 1059   ALT 9 05/24/2017 0810   BILITOT 0.5 02/18/2022 1059   BILITOT 0.41 05/24/2017 0810         Latest Reference Range & Units 10/14/21 09:23 02/18/22 10:59  Prostate Specific Ag, Serum 0.0 - 4.0 ng/mL <0.1 <0.1          Impression and Plan:  76 year old man with:   1.  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    His disease status was updated at this time and treatment choices were reviewed.  His PSA continues to be undetectable on androgen deprivation.  Risks and benefits of continuing this treatment versus therapy escalation.  These options including androgen receptor pathway inhibitors, systemic chemotherapy among others were reviewed.  At this time I have opted to defer therapy escalation unless he has disease progression noted.  He is agreeable to proceed.       2.  Androgen deprivation therapy: He is currently on Eligard and will receive it today.  Complications including weight gain, hot flashes and sexual dysfunction were reiterated.  He is agreeable to proceed.   3. Diabetes mellitus: Remains under control despite being on androgen deprivation.  Continue to monitor.  4.  Anemia: Remains mild and asymptomatic.  His hemoglobin is 12 and likely related to chronic kidney disease.   5. Followup: In 4 months for repeat evaluation.  30 minutes were spent on this visit.  The time was dedicated to reviewing laboratory data, disease status update and outlining future plan of care discussion.    Zola Button,  MD 6/29/202310:15 AM

## 2022-04-27 ENCOUNTER — Other Ambulatory Visit (INDEPENDENT_AMBULATORY_CARE_PROVIDER_SITE_OTHER): Payer: Medicare PPO

## 2022-04-27 DIAGNOSIS — E119 Type 2 diabetes mellitus without complications: Secondary | ICD-10-CM | POA: Diagnosis not present

## 2022-04-27 LAB — BASIC METABOLIC PANEL
BUN: 27 mg/dL — ABNORMAL HIGH (ref 6–23)
CO2: 27 mEq/L (ref 19–32)
Calcium: 9.6 mg/dL (ref 8.4–10.5)
Chloride: 105 mEq/L (ref 96–112)
Creatinine, Ser: 1.48 mg/dL (ref 0.40–1.50)
GFR: 45.43 mL/min — ABNORMAL LOW (ref >60.00)
Glucose, Bld: 81 mg/dL (ref 70–99)
Potassium: 5.4 mEq/L — ABNORMAL HIGH (ref 3.5–5.1)
Sodium: 140 mEq/L (ref 135–145)

## 2022-04-27 LAB — HEMOGLOBIN A1C: Hgb A1c MFr Bld: 6 % (ref 4.6–6.5)

## 2022-04-28 NOTE — Progress Notes (Unsigned)
Patient ID: TRASHAWN OQUENDO, male   DOB: May 05, 1946, 76 y.o.   MRN: [id number]    Reason for Appointment:  Follow-up visit  History of Present Illness   Problem 1:   DIABETES MELITUS, date of diagnosis 1995 with a glucose of 320 and HemoglobinA1c of 9.6 Previous history: He has been on various oral hypoglycemic drugs in the past but for the last few years has required multiple drugs in combination; diabetes has been generally well controlled with A1c near normal  Recent history:   Oral hypoglycemic drugs: Amaryl 2 mg once a day in a.m., metformin 1000 twice a day, Actos 30 mg daily  His A1c is the same at 6%   Current management, blood sugar patterns and problems identified: He has not brought his monitor for download He checks about once a week but he thinks he does not check it periodically after meals As before his blood sugars are consistently controlled highest being about 150 at home Overall has been generally active working outside in some elliptical regimen Has lost some weight Usually is consistent with following a good meal plan No symptoms of hypoglycemia Lab fasting glucose 81   Side effects from medications: None       Monitors blood glucose:  irregularly     Glucometer: One Touch ultra 2           Home blood Glucose readings <150   PRE-MEAL Fasting Lunch Dinner Bedtime Overall  Glucose range: 87 94, 161  93-115   Mean/median:        PREVIOUS average 118 with only about 5 recent readings  Dietician visit: Most recent:? 2007    Weight control:      Wt Readings from Last 3 Encounters:  04/29/22 209 lb 9.6 oz (95.1 kg)  02/25/22 214 lb 6.4 oz (97.3 kg)  10/02/21 214 lb (97.1 kg)    Diabetes labs:  Lab Results  Component Value Date   HGBA1C 6.0 04/27/2022   HGBA1C 6.0 09/28/2021   HGBA1C 6.1 03/30/2021   Lab Results  Component Value Date   MICROALBUR 9.5 (H) 09/28/2021   LDLCALC 70 09/28/2021   CREATININE 1.48 04/27/2022    Other  problems addressed today: See review of systems   Lab on 04/27/2022  Component Date Value Ref Range Status   Sodium 04/27/2022 140  135 - 145 mEq/L Final   Potassium 04/27/2022 5.4 (H)  3.5 - 5.1 mEq/L Final   Chloride 04/27/2022 105  96 - 112 mEq/L Final   CO2 04/27/2022 27  19 - 32 mEq/L Final   Glucose, Bld 04/27/2022 81  70 - 99 mg/dL Final   BUN 04/27/2022 27 (H)  6 - 23 mg/dL Final   Creatinine, Ser 04/27/2022 1.48  0.40 - 1.50 mg/dL Final   GFR 04/27/2022 45.43 (L)  >60.00 mL/min Final   Calculated using the CKD-EPI Creatinine Equation (2021)   Calcium 04/27/2022 9.6  8.4 - 10.5 mg/dL Final   Hgb A1c MFr Bld 04/27/2022 6.0  4.6 - 6.5 % Final   Glycemic Control Guidelines for People with Diabetes:Non Diabetic:  <6%Goal of Therapy: <7%Additional Action Suggested:  >8%      Allergies as of 04/29/2022       Reactions   Effexor Xr [venlafaxine Hcl] Rash        Medication List        Accurate as of April 29, 2022  8:36 AM. If you have any questions, ask your nurse or doctor.  Accu-Chek Guide test strip Generic drug: glucose blood Use to check blood sugars daily   Accu-Chek Guide w/Device Kit Use to check blood sugars daily   amLODipine 10 MG tablet Commonly known as: NORVASC Take 1 tablet (10 mg total) by mouth daily.   atorvastatin 10 MG tablet Commonly known as: LIPITOR TAKE 1 TABLET(10 MG) BY MOUTH DAILY   cyanocobalamin 500 MCG tablet Commonly known as: VITAMIN B12 Take 2 tablets by mouth daily.   glimepiride 2 MG tablet Commonly known as: AMARYL TAKE 1 TABLET (2 MG TOTAL) BY MOUTH DAILY WITH BREAKFAST.   hydrALAZINE 10 MG tablet Commonly known as: APRESOLINE Take 10 mg by mouth daily.   losartan 100 MG tablet Commonly known as: COZAAR Take 1 tablet by mouth daily.   LUPRON DEPOT (63-MONTH) IM Inject into the muscle.   metFORMIN 1000 MG tablet Commonly known as: GLUCOPHAGE Take 1 tablet by mouth in the morning and at bedtime.    Myrbetriq 50 MG Tb24 tablet Generic drug: mirabegron ER Take 1 tablet by mouth daily.   onetouch ultrasoft lancets Use as instructed to check blood sugar 2 times daily   pioglitazone 30 MG tablet Commonly known as: ACTOS TAKE 1 TABLET(30 MG) BY MOUTH DAILY        Allergies:  Allergies  Allergen Reactions   Effexor Xr [Venlafaxine Hcl] Rash    Past Medical History:  Diagnosis Date   Diabetes mellitus without complication ([facility])    Hypertension    prostate ca dx'd 2006   prostatectomy and xrt    No past surgical history on file.  Family History  Problem Relation Age of Onset   Hypertension Father    Heart disease Neg Hx    Diabetes Neg Hx     Social History:  reports that he has been smoking. He has never used smokeless tobacco. No history on file for alcohol use and drug use.    REVIEW OF SYSTEMS:       Hypertension: Was on 25 mg losartan previously and this was not increased because of high potassium and abnormal renal function study Previously his PCP has increased the dose to 100 mg and added hydralazine 10 mg twice daily because of blood pressure readings in the 110-315 systolic range late last year  Also on amlodipine 10 mg  He is monitoring blood pressure at home, he reports that most of his systolic readings are in the 130s-140s and occasionally below 130  BP Readings from Last 3 Encounters:  04/29/22 (!) 144/84  02/25/22 (!) 141/70  10/22/21 (!) 144/62    Has relatively high creatinine levels for some time Creatinine is about the same overall  Tends to have high normal potassium levels at times, high again   Lab Results  Component Value Date   CREATININE 1.48 04/27/2022   CREATININE 1.57 (H) 02/18/2022   CREATININE 1.30 (H) 10/14/2021   Lab Results  Component Value Date   K 5.4 (H) 04/27/2022     Lipids:  he has been treated with Lipitor 10 mg which he takes regularly LDL is consistently around 70   Lab Results  Component Value  Date   CHOL 162 09/28/2021   CHOL 160 04/27/2021   CHOL 169 03/30/2021   Lab Results  Component Value Date   HDL 73.80 09/28/2021   HDL 59.90 04/27/2021   HDL 57.10 03/30/2021   Lab Results  Component Value Date   LDLCALC 70 09/28/2021   [place] 72 04/27/2021   [place]  73 08/04/2020   Lab Results  Component Value Date   TRIG 89.0 09/28/2021   TRIG 142.0 04/27/2021   TRIG 255.0 (H) 03/30/2021   Lab Results  Component Value Date   CHOLHDL 2 09/28/2021   CHOLHDL 3 04/27/2021   CHOLHDL 3 03/30/2021   Lab Results  Component Value Date   LDLDIRECT 84.0 03/30/2021      History of vitamin B12 deficiency on supplement Has mild chronic anemia His last B12 level is normal  CBC followed by oncologist    Last eye exam was in 2023 with the [facility], report again not available  Last foot exam 8/22  LABS:  Lab on 04/27/2022  Component Date Value Ref Range Status   Sodium 04/27/2022 140  135 - 145 mEq/L Final   Potassium 04/27/2022 5.4 (H)  3.5 - 5.1 mEq/L Final   Chloride 04/27/2022 105  96 - 112 mEq/L Final   CO2 04/27/2022 27  19 - 32 mEq/L Final   Glucose, Bld 04/27/2022 81  70 - 99 mg/dL Final   BUN 04/27/2022 27 (H)  6 - 23 mg/dL Final   Creatinine, Ser 04/27/2022 1.48  0.40 - 1.50 mg/dL Final   GFR 04/27/2022 45.43 (L)  >60.00 mL/min Final   Calculated using the CKD-EPI Creatinine Equation (2021)   Calcium 04/27/2022 9.6  8.4 - 10.5 mg/dL Final   Hgb A1c MFr Bld 04/27/2022 6.0  4.6 - 6.5 % Final   Glycemic Control Guidelines for People with Diabetes:Non Diabetic:  <6%Goal of Therapy: <7%Additional Action Suggested:  >8%      Examination:   BP (!) 144/84   Pulse 84   Ht 5' 10.5" (1.791 m)   Wt 209 lb 9.6 oz (95.1 kg)   SpO2 94%   BMI 29.65 kg/m   Body mass index is 29.65 kg/m.    ASSESSMENT/ PLAN:    Diabetes type 2 with mild obesity:    He is on Actos, Amaryl and Metformin long-term  A1c is very consistent at about 6%  No side effects from metformin  or Actos  He will continue the same regimen To call if he has any hypoglycemia  Hypertension: Blood pressure is overall fairly well controlled and also managed by PCP with 3 drugs Consider increasing hydralazine if blood pressure stays consistently high  CKD: Although this is mild and stable may consider Farxiga especially blood pressure tends to be higher  He now may have mild hyperkalemia with 100 mg losartan but will recheck labs today with drawing the blood without a tourniquet  Encouraged him to continue monitoring at home   LIPIDS: LDL well-controlled on atorvastatin, triglycerides normal also   Elayne Snare 04/29/2022, 8:36 AM     Note: This office note was prepared with Dragon voice recognition system technology. Any transcriptional errors that result from this process are unintentional.

## 2022-04-29 ENCOUNTER — Ambulatory Visit: Payer: Medicare PPO | Admitting: Endocrinology

## 2022-04-29 ENCOUNTER — Encounter: Payer: Self-pay | Admitting: Endocrinology

## 2022-04-29 VITALS — BP 144/84 | HR 84 | Ht 70.5 in | Wt 209.6 lb

## 2022-04-29 DIAGNOSIS — E875 Hyperkalemia: Secondary | ICD-10-CM

## 2022-04-29 DIAGNOSIS — I10 Essential (primary) hypertension: Secondary | ICD-10-CM

## 2022-04-29 DIAGNOSIS — E7800 Pure hypercholesterolemia, unspecified: Secondary | ICD-10-CM

## 2022-04-29 DIAGNOSIS — E119 Type 2 diabetes mellitus without complications: Secondary | ICD-10-CM | POA: Diagnosis not present

## 2022-04-29 LAB — POTASSIUM: Potassium: 4.4 mEq/L (ref 3.5–5.1)

## 2022-06-10 ENCOUNTER — Other Ambulatory Visit: Payer: Self-pay

## 2022-06-10 ENCOUNTER — Inpatient Hospital Stay: Payer: Medicare PPO | Attending: Oncology

## 2022-06-10 DIAGNOSIS — D649 Anemia, unspecified: Secondary | ICD-10-CM | POA: Diagnosis not present

## 2022-06-10 DIAGNOSIS — E119 Type 2 diabetes mellitus without complications: Secondary | ICD-10-CM | POA: Diagnosis not present

## 2022-06-10 DIAGNOSIS — Z5111 Encounter for antineoplastic chemotherapy: Secondary | ICD-10-CM | POA: Diagnosis not present

## 2022-06-10 DIAGNOSIS — C61 Malignant neoplasm of prostate: Secondary | ICD-10-CM

## 2022-06-10 LAB — CBC WITH DIFFERENTIAL (CANCER CENTER ONLY)
Abs Immature Granulocytes: 0.01 10*3/uL (ref 0.00–0.07)
Basophils Absolute: 0.1 10*3/uL (ref 0.0–0.1)
Basophils Relative: 1 %
Eosinophils Absolute: 0.1 10*3/uL (ref 0.0–0.5)
Eosinophils Relative: 2 %
HCT: 34.6 % — ABNORMAL LOW (ref 39.0–52.0)
Hemoglobin: 11.4 g/dL — ABNORMAL LOW (ref 13.0–17.0)
Immature Granulocytes: 0 %
Lymphocytes Relative: 33 %
Lymphs Abs: 1.9 10*3/uL (ref 0.7–4.0)
MCH: 32.7 pg (ref 26.0–34.0)
MCHC: 32.9 g/dL (ref 30.0–36.0)
MCV: 99.1 fL (ref 80.0–100.0)
Monocytes Absolute: 0.6 10*3/uL (ref 0.1–1.0)
Monocytes Relative: 11 %
Neutro Abs: 3.1 10*3/uL (ref 1.7–7.7)
Neutrophils Relative %: 53 %
Platelet Count: 215 10*3/uL (ref 150–400)
RBC: 3.49 MIL/uL — ABNORMAL LOW (ref 4.22–5.81)
RDW: 12.9 % (ref 11.5–15.5)
WBC Count: 5.8 10*3/uL (ref 4.0–10.5)
nRBC: 0 % (ref 0.0–0.2)

## 2022-06-10 LAB — CMP (CANCER CENTER ONLY)
ALT: 11 U/L (ref 0–44)
AST: 14 U/L — ABNORMAL LOW (ref 15–41)
Albumin: 3.8 g/dL (ref 3.5–5.0)
Alkaline Phosphatase: 52 U/L (ref 38–126)
Anion gap: 3 — ABNORMAL LOW (ref 5–15)
BUN: 29 mg/dL — ABNORMAL HIGH (ref 8–23)
CO2: 31 mmol/L (ref 22–32)
Calcium: 8.7 mg/dL — ABNORMAL LOW (ref 8.9–10.3)
Chloride: 103 mmol/L (ref 98–111)
Creatinine: 1.45 mg/dL — ABNORMAL HIGH (ref 0.61–1.24)
GFR, Estimated: 50 mL/min — ABNORMAL LOW (ref >60)
Glucose, Bld: 102 mg/dL — ABNORMAL HIGH (ref 70–99)
Potassium: 4.9 mmol/L (ref 3.5–5.1)
Sodium: 137 mmol/L (ref 135–145)
Total Bilirubin: 0.5 mg/dL (ref 0.3–1.2)
Total Protein: 6.2 g/dL — ABNORMAL LOW (ref 6.5–8.1)

## 2022-06-11 LAB — PROSTATE-SPECIFIC AG, SERUM (LABCORP): Prostate Specific Ag, Serum: <0.1 ng/mL (ref 0.0–4.0)

## 2022-06-17 ENCOUNTER — Inpatient Hospital Stay: Payer: Medicare PPO

## 2022-06-17 ENCOUNTER — Other Ambulatory Visit: Payer: Self-pay

## 2022-06-17 ENCOUNTER — Inpatient Hospital Stay: Payer: Medicare PPO | Admitting: Oncology

## 2022-06-17 VITALS — BP 144/67 | HR 69 | Temp 97.7°F | Resp 17 | Ht 70.5 in | Wt 216.9 lb

## 2022-06-17 DIAGNOSIS — C61 Malignant neoplasm of prostate: Secondary | ICD-10-CM

## 2022-06-17 DIAGNOSIS — Z8546 Personal history of malignant neoplasm of prostate: Secondary | ICD-10-CM

## 2022-06-17 DIAGNOSIS — Z5111 Encounter for antineoplastic chemotherapy: Secondary | ICD-10-CM | POA: Diagnosis not present

## 2022-06-17 MED ORDER — LEUPROLIDE ACETATE (4 MONTH) 30 MG ~~LOC~~ KIT
30.0000 mg | PACK | Freq: Once | SUBCUTANEOUS | Status: AC
  Administered 2022-06-17: 30 mg via SUBCUTANEOUS
  Filled 2022-06-17: qty 30

## 2022-06-17 NOTE — Progress Notes (Signed)
Hematology and Oncology Follow Up Visit  Richard [name] [id number] 1946/06/11 76 y.o. 06/17/2022 10:20 AM    Principle Diagnosis: 76 year old man with prostate cancer diagnosed in 2006.  He developed castration-sensitive with biochemical relapse in 2016.  He presented with Gleason score 4+3 equal 7 and a PSA of 3.66 at the time of diagnosis.   Prior Therapy: 1. He is status post retropubic prostatectomy and bilateral lymph node dissection done on 11/25/2004. The pathology showed a Gleason score of 4+4 equals 8 with staging of T2b N0.  2. Sstatus post salvage radiation therapy done between March and May of 2007 for a rise in his PSA to 0.29. He received total of 65 gray in 35 fractions. 3.  He was treated with intermittent Lupron until 2016.  Lupron has been given at that time.  Current therapy:   Eligard 30 mg every 4 months started in April 2016.  He is due to receive his next injection today.  Interim History: [name] returns today for repeat follow-up.  Since her last visit,          Medications: Updated on review.  Current Outpatient Medications on File Prior to Visit  Medication Sig Dispense Refill   amLODipine (NORVASC) 10 MG tablet Take 1 tablet (10 mg total) by mouth daily. 90 tablet 2   atorvastatin (LIPITOR) 10 MG tablet TAKE 1 TABLET(10 MG) BY MOUTH DAILY 90 tablet 0   Blood Glucose Monitoring Suppl (ACCU-CHEK GUIDE) w/Device KIT Use to check blood sugars daily 1 kit 0   glimepiride (AMARYL) 2 MG tablet TAKE 1 TABLET (2 MG TOTAL) BY MOUTH DAILY WITH BREAKFAST. 90 tablet 0   glucose blood (ACCU-CHEK GUIDE) test strip Use to check blood sugars daily 100 each 2   hydrALAZINE (APRESOLINE) 10 MG tablet Take 10 mg by mouth daily.     Lancets (ONETOUCH ULTRASOFT) lancets Use as instructed to check blood sugar 2 times daily 100 each 11   Leuprolide Acetate (LUPRON DEPOT, 58-MONTH, IM) Inject into the muscle.     losartan (COZAAR) 100 MG tablet Take 1 tablet by mouth  daily.     metFORMIN (GLUCOPHAGE) 1000 MG tablet Take 1 tablet by mouth in the morning and at bedtime.     MYRBETRIQ 50 MG TB24 tablet Take 1 tablet by mouth daily.     pioglitazone (ACTOS) 30 MG tablet TAKE 1 TABLET(30 MG) BY MOUTH DAILY 90 tablet 0   vitamin B-12 (CYANOCOBALAMIN) 500 MCG tablet Take 2 tablets by mouth daily.     No current facility-administered medications on file prior to visit.     Allergies:  Allergies  Allergen Reactions   Effexor Xr [Venlafaxine Hcl] Rash      . Physical Exam:             ECOG: 0   General appearance: Comfortable appearing without any discomfort Head: Normocephalic without any trauma Oropharynx: Mucous membranes are moist and pink without any thrush or ulcers. Eyes: Pupils are equal and round reactive to light. Lymph nodes: No cervical, supraclavicular, inguinal or axillary lymphadenopathy.   Heart:regular rate and rhythm.  S1 and S2 without leg edema. Lung: Clear without any rhonchi or wheezes.  No dullness to percussion. Abdomin: Soft, nontender, nondistended with good bowel sounds.  No hepatosplenomegaly. Musculoskeletal: No joint deformity or effusion.  Full range of motion noted. Neurological: No deficits noted on motor, sensory and deep tendon reflex exam. Skin: No petechial rash or dryness.  Appeared moist.  Lab Results: Lab Results  Component Value Date   WBC 5.8 06/10/2022   HGB 11.4 (L) 06/10/2022   HCT 34.6 (L) 06/10/2022   MCV 99.1 06/10/2022   PLT 215 06/10/2022     Chemistry      Component Value Date/Time   NA 137 06/10/2022 1008   NA 140 05/24/2017 0810   K 4.9 06/10/2022 1008   K 4.4 05/24/2017 0810   CL 103 06/10/2022 1008   CL 102 02/20/2013 1254   CO2 31 06/10/2022 1008   CO2 25 05/24/2017 0810   BUN 29 (H) 06/10/2022 1008   BUN 24.7 05/24/2017 0810   CREATININE 1.45 (H) 06/10/2022 1008   CREATININE 1.2 05/24/2017 0810      Component Value Date/Time   CALCIUM  8.7 (L) 06/10/2022 1008   CALCIUM 9.3 05/24/2017 0810   ALKPHOS 52 06/10/2022 1008   ALKPHOS 60 05/24/2017 0810   AST 14 (L) 06/10/2022 1008   AST 14 05/24/2017 0810   ALT 11 06/10/2022 1008   ALT 9 05/24/2017 0810   BILITOT 0.5 06/10/2022 1008   BILITOT 0.41 05/24/2017 0810          Latest Reference Range & Units 10/14/21 09:23 02/18/22 10:59 06/10/22 10:08  Prostate Specific Ag, Serum 0.0 - 4.0 ng/mL <0.1 <0.1 <0.1        Impression and Plan:  76 year old man with:   1.  Advanced prostate cancer with biochemical relapse diagnosed in 2016.  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  His PSA continues to be undetectable on androgen deprivation therapy alone.  Therapy escalation with androgen receptor pathway inhibitors have been deferred at this time given his excellent disease control.  Different salvage therapy options will be utilized if he has a rise in his PSA.  Repeat staging scans will be done if he has a rise in his PSA as well.       2.  Androgen deprivation therapy: He will receive Eligard today and repeated in 4 months.  Complication including weight gain, hot flashes were discussed.   3. Diabetes mellitus: No exacerbation noted at this time.  4.  Anemia: His hemoglobin continues to be close to baseline between 11 and 12.   5. Followup: In 4 months for repeat follow-up.  30 minutes were dedicated to this encounter.  The time was spent on updating his disease status, treatment choices and outlining future plan of care review.    Zola Button, MD 10/19/202310:20 AM

## 2022-07-23 ENCOUNTER — Telehealth: Payer: Medicare PPO | Admitting: Physician Assistant

## 2022-07-23 DIAGNOSIS — H66002 Acute suppurative otitis media without spontaneous rupture of ear drum, left ear: Secondary | ICD-10-CM

## 2022-07-23 MED ORDER — AMOXICILLIN 500 MG PO CAPS: 500.0000 mg | ORAL_CAPSULE | Freq: Two times a day (BID) | ORAL | 0 refills | Status: AC

## 2022-07-23 MED ORDER — NEOMYCIN-POLYMYXIN-HC 3.5-10000-1 OT SOLN: 3.0000 [drp] | Freq: Four times a day (QID) | OTIC | 0 refills | Status: DC

## 2022-07-23 NOTE — Patient Instructions (Signed)
Richard [name], thank you for joining Mar Daring, PA-C for today's virtual visit.  While this provider is not your primary care provider (PCP), if your PCP is located in our provider database this encounter information will be shared with them immediately following your visit.   [facility] account gives you access to today's visit and all your visits, tests, and labs performed at [facility] " click here if you don't have a [facility] account or go to [email or web address]  Consent: (Patient) Richard [name] provided verbal consent for this virtual visit at the beginning of the encounter.  Current Medications:  Current Outpatient Medications:    amoxicillin (AMOXIL) 500 MG capsule, Take 1 capsule (500 mg total) by mouth 2 (two) times daily for 10 days., Disp: 20 capsule, Rfl: 0   neomycin-polymyxin-hydrocortisone (CORTISPORIN) OTIC solution, Place 3 drops into the left ear 4 (four) times daily. X 7days, Disp: 10 mL, Rfl: 0   amLODipine (NORVASC) 10 MG tablet, Take 1 tablet (10 mg total) by mouth daily., Disp: 90 tablet, Rfl: 2   atorvastatin (LIPITOR) 10 MG tablet, TAKE 1 TABLET(10 MG) BY MOUTH DAILY, Disp: 90 tablet, Rfl: 0   Blood Glucose Monitoring Suppl (ACCU-CHEK GUIDE) w/Device KIT, Use to check blood sugars daily, Disp: 1 kit, Rfl: 0   glimepiride (AMARYL) 2 MG tablet, TAKE 1 TABLET (2 MG TOTAL) BY MOUTH DAILY WITH BREAKFAST., Disp: 90 tablet, Rfl: 0   glucose blood (ACCU-CHEK GUIDE) test strip, Use to check blood sugars daily, Disp: 100 each, Rfl: 2   hydrALAZINE (APRESOLINE) 10 MG tablet, Take 10 mg by mouth daily., Disp: , Rfl:    Lancets (ONETOUCH ULTRASOFT) lancets, Use as instructed to check blood sugar 2 times daily, Disp: 100 each, Rfl: 11   Leuprolide Acetate (LUPRON DEPOT, 31-MONTH, IM), Inject into the muscle., Disp: , Rfl:    losartan (COZAAR) 100 MG tablet, Take 1 tablet by mouth daily., Disp: , Rfl:    metFORMIN (GLUCOPHAGE)  1000 MG tablet, Take 1 tablet by mouth in the morning and at bedtime., Disp: , Rfl:    MYRBETRIQ 50 MG TB24 tablet, Take 1 tablet by mouth daily., Disp: , Rfl:    pioglitazone (ACTOS) 30 MG tablet, TAKE 1 TABLET(30 MG) BY MOUTH DAILY, Disp: 90 tablet, Rfl: 0   vitamin B-12 (CYANOCOBALAMIN) 500 MCG tablet, Take 2 tablets by mouth daily., Disp: , Rfl:    Medications ordered in this encounter:  Meds ordered this encounter  Medications   amoxicillin (AMOXIL) 500 MG capsule    Sig: Take 1 capsule (500 mg total) by mouth 2 (two) times daily for 10 days.    Dispense:  20 capsule    Refill:  0    Order Specific Question:   Supervising Provider    Answer:   Chase Picket [id number]   neomycin-polymyxin-hydrocortisone (CORTISPORIN) OTIC solution    Sig: Place 3 drops into the left ear 4 (four) times daily. X 7days    Dispense:  10 mL    Refill:  0    Order Specific Question:   Supervising Provider    Answer:   Chase Picket [[id number]]     *If you need refills on other medications prior to your next appointment, please contact your pharmacy*  Follow-Up: Call back or seek an in-person evaluation if the symptoms worsen or if the condition fails to improve as anticipated.  [place] [phone]  Other Instructions  Otitis Media,  Adult  Otitis media occurs when there is inflammation and fluid in the middle ear with signs and symptoms of an acute infection. The middle ear is a part of the ear that contains bones for hearing as well as air that helps send sounds to the brain. When infected fluid builds up in this space, it causes pressure and can lead to an ear infection. The eustachian tube connects the middle ear to the back of the nose (nasopharynx) and normally allows air into the middle ear. If the eustachian tube becomes blocked, fluid can build up and become infected. What are the causes? This condition is caused by a blockage in the eustachian tube. This can be  caused by mucus or by swelling of the tube. Problems that can cause a blockage include: A cold or other upper respiratory infection. Allergies. An irritant, such as tobacco smoke. Enlarged adenoids. The adenoids are areas of soft tissue located high in the back of the throat, behind the nose and the roof of the mouth. They are part of the body's defense system (immune system). A mass in the nasopharynx. Damage to the ear caused by pressure changes (barotrauma). What increases the risk? You are more likely to develop this condition if you: Smoke or are exposed to tobacco smoke. Have an opening in the roof of your mouth (cleft palate). Have gastroesophageal reflux. Have an immune system disorder. What are the signs or symptoms? Symptoms of this condition include: Ear pain. Fever. Decreased hearing. Tiredness (lethargy). Fluid leaking from the ear, if the eardrum is ruptured or has burst. Ringing in the ear. How is this diagnosed?  This condition is diagnosed with a physical exam. During the exam, your health care provider will use an instrument called an otoscope to look in your ear and check for redness, swelling, and fluid. He or she will also ask about your symptoms. Your health care provider may also order tests, such as: A pneumatic otoscopy. This is a test to check the movement of the eardrum. It is done by squeezing a small amount of air into the ear. A tympanogram. This is a test that shows how well the eardrum moves in response to air pressure in the ear canal. It provides a graph for your health care provider to review. How is this treated? This condition can go away on its own within 3-5 days. But if the condition is caused by a bacterial infection and does not go away on its own, or if it keeps coming back, your health care provider may: Prescribe antibiotic medicine to treat the infection. Prescribe or recommend medicines to control pain. Follow these instructions at  home: Take over-the-counter and prescription medicines only as told by your health care provider. If you were prescribed an antibiotic medicine, take it as told by your health care provider. Do not stop taking the antibiotic even if you start to feel better. Keep all follow-up visits. This is important. Contact a health care provider if: You have bleeding from your nose. There is a lump on your neck. You are not feeling better in 5 days. You feel worse instead of better. Get help right away if: You have severe pain that is not controlled with medicine. You have swelling, redness, or pain around your ear. You have stiffness in your neck. A part of your face is not moving (paralyzed). The bone behind your ear (mastoid bone) is tender when you touch it. You develop a severe headache. Summary Otitis media is  redness, soreness, and swelling of the middle ear, usually resulting in pain and decreased hearing. This condition can go away on its own within 3-5 days. If the problem does not go away in 3-5 days, your health care provider may give you medicines to treat the infection. If you were prescribed an antibiotic medicine, take it as told by your health care provider. Follow all instructions that were given to you by your health care provider. This information is not intended to replace advice given to you by your health care provider. Make sure you discuss any questions you have with your health care provider. Document Revised: 11/24/2020 Document Reviewed: 11/24/2020 Elsevier Patient Education  [place].    If you have been instructed to have an in-person evaluation today at a local Urgent Care facility, please use the link below. It will take you to a list of all of our available [facility], including address, phone number and hours of operation. Please do not delay care.  [facility]  If you or a family member do not have a primary care provider, use  the link below to schedule a visit and establish care. When you choose a [facility] primary care physician or advanced practice provider, you gain a long-term partner in health. Find a Primary Care Provider  Learn more about [facility]'s in-office and virtual care options: [facility]

## 2022-07-23 NOTE — Progress Notes (Signed)
Virtual Visit Consent   Richard [name], you are scheduled for a virtual visit with a [facility] provider today. Just as with appointments in the office, your consent must be obtained to participate. Your consent will be active for this visit and any virtual visit you may have with one of our providers in the next 365 days. If you have a MyChart account, a copy of this consent can be sent to you electronically.  As this is a virtual visit, video technology does not allow for your provider to perform a traditional examination. This may limit your provider's ability to fully assess your condition. If your provider identifies any concerns that need to be evaluated in person or the need to arrange testing (such as labs, EKG, etc.), we will make arrangements to do so. Although advances in technology are sophisticated, we cannot ensure that it will always work on either your end or our end. If the connection with a video visit is poor, the visit may have to be switched to a telephone visit. With either a video or telephone visit, we are not always able to ensure that we have a secure connection.  By engaging in this virtual visit, you consent to the provision of healthcare and authorize for your insurance to be billed (if applicable) for the services provided during this visit. Depending on your insurance coverage, you may receive a charge related to this service.  I need to obtain your verbal consent now. Are you willing to proceed with your visit today? Richard [name] has provided verbal consent on 07/23/2022 for a virtual visit (video or telephone). Mar Daring, PA-C  Date: 07/23/2022 11:10 AM  Virtual Visit via Video Note   I, Mar Daring, connected with  Richard [name]  ([id number], 07-Apr-1946) on 07/23/22 at 11:00 AM EST by a video-enabled telemedicine application and verified that I am speaking with the correct person using two identifiers.  Location: Patient: Virtual Visit  Location Patient: Home Provider: Virtual Visit Location Provider: Home Office   I discussed the limitations of evaluation and management by telemedicine and the availability of in person appointments. The patient expressed understanding and agreed to proceed.    History of Present Illness: Richard [name] is a 76 y.o. who identifies as a male who was assigned male at birth, and is being seen today for URI symptoms.  HPI: URI  This is a new problem. The current episode started in the past 7 days (Monday; felt better yesterday and worsened this morning). The problem has been gradually worsening. The maximum temperature recorded prior to his arrival was 101 - 101.9 F (101.7). The fever has been present for Less than 1 day. Associated symptoms include congestion, coughing, ear pain (severe lightning pain in left ear and left temple; with acute hearing loss), headaches, a plugged ear sensation (left) and a sore throat. Pertinent negatives include no diarrhea, nausea, rhinorrhea, sinus pain or vomiting. Associated symptoms comments: Severe chills. Treatments tried: delsym, advil. The treatment provided no relief.     Problems:  Patient Active Problem List   Diagnosis Date Noted   Post traumatic stress disorder (PTSD) 07/15/2014   Vitamin B 12 deficiency 12/12/2013   H/O prostate cancer 12/12/2013   Unspecified essential hypertension 08/10/2013   Other and unspecified hyperlipidemia 08/10/2013   Type II or unspecified type diabetes mellitus without mention of complication, not stated as uncontrolled 08/07/2013    Allergies:  Allergies  Allergen Reactions   Effexor Xr [  Venlafaxine Hcl] Rash   Medications:  Current Outpatient Medications:    amoxicillin (AMOXIL) 500 MG capsule, Take 1 capsule (500 mg total) by mouth 2 (two) times daily for 10 days., Disp: 20 capsule, Rfl: 0   neomycin-polymyxin-hydrocortisone (CORTISPORIN) OTIC solution, Place 3 drops into the left ear 4 (four) times daily. X  7days, Disp: 10 mL, Rfl: 0   amLODipine (NORVASC) 10 MG tablet, Take 1 tablet (10 mg total) by mouth daily., Disp: 90 tablet, Rfl: 2   atorvastatin (LIPITOR) 10 MG tablet, TAKE 1 TABLET(10 MG) BY MOUTH DAILY, Disp: 90 tablet, Rfl: 0   Blood Glucose Monitoring Suppl (ACCU-CHEK GUIDE) w/Device KIT, Use to check blood sugars daily, Disp: 1 kit, Rfl: 0   glimepiride (AMARYL) 2 MG tablet, TAKE 1 TABLET (2 MG TOTAL) BY MOUTH DAILY WITH BREAKFAST., Disp: 90 tablet, Rfl: 0   glucose blood (ACCU-CHEK GUIDE) test strip, Use to check blood sugars daily, Disp: 100 each, Rfl: 2   hydrALAZINE (APRESOLINE) 10 MG tablet, Take 10 mg by mouth daily., Disp: , Rfl:    Lancets (ONETOUCH ULTRASOFT) lancets, Use as instructed to check blood sugar 2 times daily, Disp: 100 each, Rfl: 11   Leuprolide Acetate (LUPRON DEPOT, 5-MONTH, IM), Inject into the muscle., Disp: , Rfl:    losartan (COZAAR) 100 MG tablet, Take 1 tablet by mouth daily., Disp: , Rfl:    metFORMIN (GLUCOPHAGE) 1000 MG tablet, Take 1 tablet by mouth in the morning and at bedtime., Disp: , Rfl:    MYRBETRIQ 50 MG TB24 tablet, Take 1 tablet by mouth daily., Disp: , Rfl:    pioglitazone (ACTOS) 30 MG tablet, TAKE 1 TABLET(30 MG) BY MOUTH DAILY, Disp: 90 tablet, Rfl: 0   vitamin B-12 (CYANOCOBALAMIN) 500 MCG tablet, Take 2 tablets by mouth daily., Disp: , Rfl:   Observations/Objective: Patient is well-developed, well-nourished in no acute distress.  Resting comfortably at home.  Head is normocephalic, atraumatic.  No labored breathing.  Speech is clear and coherent with logical content.  Patient is alert and oriented at baseline.    Assessment and Plan: 1. Non-recurrent acute suppurative otitis media of left ear without spontaneous rupture of tympanic membrane - amoxicillin (AMOXIL) 500 MG capsule; Take 1 capsule (500 mg total) by mouth 2 (two) times daily for 10 days.  Dispense: 20 capsule; Refill: 0 - neomycin-polymyxin-hydrocortisone (CORTISPORIN)  OTIC solution; Place 3 drops into the left ear 4 (four) times daily. X 7days  Dispense: 10 mL; Refill: 0  - Worsening symptoms that have not responded to OTC medications.  - Will give Amoxicillin and Cortisporin ear drops - Continue saline nasal rinses - Could consider to add Flonase (Fluticasone) nasal spray over the counter for possible eustachian tube dysfunction - Steam and humidifier can help - Warm compress to ear - Stay well hydrated and get plenty of rest.  - Seek in person evaluation if no symptom improvement or if symptoms worsen   Follow Up Instructions: I discussed the assessment and treatment plan with the patient. The patient was provided an opportunity to ask questions and all were answered. The patient agreed with the plan and demonstrated an understanding of the instructions.  A copy of instructions were sent to the patient via MyChart unless otherwise noted below.    The patient was advised to call back or seek an in-person evaluation if the symptoms worsen or if the condition fails to improve as anticipated.  Time:  I spent 10 minutes with the patient via telehealth  technology discussing the above problems/concerns.    Mar Daring, PA-C

## 2022-07-24 ENCOUNTER — Emergency Department (HOSPITAL_COMMUNITY): Payer: Medicare PPO

## 2022-07-24 ENCOUNTER — Emergency Department (HOSPITAL_COMMUNITY)
Admission: EM | Admit: 2022-07-24 | Discharge: 2022-07-25 | Disposition: A | Discharge status: Home / Self Care | Payer: Medicare PPO | Attending: Student | Admitting: Student

## 2022-07-24 DIAGNOSIS — R059 Cough, unspecified: Secondary | ICD-10-CM | POA: Diagnosis not present

## 2022-07-24 DIAGNOSIS — E119 Type 2 diabetes mellitus without complications: Secondary | ICD-10-CM | POA: Insufficient documentation

## 2022-07-24 DIAGNOSIS — Z1152 Encounter for screening for COVID-19: Secondary | ICD-10-CM | POA: Insufficient documentation

## 2022-07-24 DIAGNOSIS — H9202 Otalgia, left ear: Secondary | ICD-10-CM | POA: Diagnosis not present

## 2022-07-24 DIAGNOSIS — D72829 Elevated white blood cell count, unspecified: Secondary | ICD-10-CM | POA: Diagnosis not present

## 2022-07-24 DIAGNOSIS — R410 Disorientation, unspecified: Secondary | ICD-10-CM | POA: Diagnosis not present

## 2022-07-24 DIAGNOSIS — Z79899 Other long term (current) drug therapy: Secondary | ICD-10-CM | POA: Insufficient documentation

## 2022-07-24 DIAGNOSIS — L539 Erythematous condition, unspecified: Secondary | ICD-10-CM | POA: Insufficient documentation

## 2022-07-24 DIAGNOSIS — Z8546 Personal history of malignant neoplasm of prostate: Secondary | ICD-10-CM | POA: Diagnosis not present

## 2022-07-24 DIAGNOSIS — F17200 Nicotine dependence, unspecified, uncomplicated: Secondary | ICD-10-CM | POA: Diagnosis not present

## 2022-07-24 DIAGNOSIS — H60392 Other infective otitis externa, left ear: Secondary | ICD-10-CM

## 2022-07-24 DIAGNOSIS — Z8673 Personal history of transient ischemic attack (TIA), and cerebral infarction without residual deficits: Secondary | ICD-10-CM | POA: Insufficient documentation

## 2022-07-24 DIAGNOSIS — R519 Headache, unspecified: Secondary | ICD-10-CM | POA: Diagnosis present

## 2022-07-24 DIAGNOSIS — R509 Fever, unspecified: Secondary | ICD-10-CM | POA: Diagnosis not present

## 2022-07-24 DIAGNOSIS — I10 Essential (primary) hypertension: Secondary | ICD-10-CM | POA: Insufficient documentation

## 2022-07-24 DIAGNOSIS — Z7984 Long term (current) use of oral hypoglycemic drugs: Secondary | ICD-10-CM | POA: Diagnosis not present

## 2022-07-24 DIAGNOSIS — H66002 Acute suppurative otitis media without spontaneous rupture of ear drum, left ear: Secondary | ICD-10-CM

## 2022-07-24 LAB — COMPREHENSIVE METABOLIC PANEL
ALT: 13 U/L (ref 0–44)
AST: 21 U/L (ref 15–41)
Albumin: 3 g/dL — ABNORMAL LOW (ref 3.5–5.0)
Alkaline Phosphatase: 63 U/L (ref 38–126)
Anion gap: 11 (ref 5–15)
BUN: 20 mg/dL (ref 8–23)
CO2: 22 mmol/L (ref 22–32)
Calcium: 8.6 mg/dL — ABNORMAL LOW (ref 8.9–10.3)
Chloride: 99 mmol/L (ref 98–111)
Creatinine, Ser: 1.52 mg/dL — ABNORMAL HIGH (ref 0.61–1.24)
GFR, Estimated: 47 mL/min — ABNORMAL LOW (ref >60)
Glucose, Bld: 76 mg/dL (ref 70–99)
Potassium: 4.3 mmol/L (ref 3.5–5.1)
Sodium: 132 mmol/L — ABNORMAL LOW (ref 135–145)
Total Bilirubin: 0.5 mg/dL (ref 0.3–1.2)
Total Protein: 6 g/dL — ABNORMAL LOW (ref 6.5–8.1)

## 2022-07-24 LAB — CBC WITH DIFFERENTIAL/PLATELET
Abs Immature Granulocytes: 0.11 10*3/uL — ABNORMAL HIGH (ref 0.00–0.07)
Basophils Absolute: 0 10*3/uL (ref 0.0–0.1)
Basophils Relative: 0 %
Eosinophils Absolute: 0 10*3/uL (ref 0.0–0.5)
Eosinophils Relative: 0 %
HCT: 35.8 % — ABNORMAL LOW (ref 39.0–52.0)
Hemoglobin: 11.3 g/dL — ABNORMAL LOW (ref 13.0–17.0)
Immature Granulocytes: 1 %
Lymphocytes Relative: 9 %
Lymphs Abs: 1.1 10*3/uL (ref 0.7–4.0)
MCH: 31.9 pg (ref 26.0–34.0)
MCHC: 31.6 g/dL (ref 30.0–36.0)
MCV: 101.1 fL — ABNORMAL HIGH (ref 80.0–100.0)
Monocytes Absolute: 1.4 10*3/uL — ABNORMAL HIGH (ref 0.1–1.0)
Monocytes Relative: 11 %
Neutro Abs: 9.5 10*3/uL — ABNORMAL HIGH (ref 1.7–7.7)
Neutrophils Relative %: 79 %
Platelets: 242 10*3/uL (ref 150–400)
RBC: 3.54 MIL/uL — ABNORMAL LOW (ref 4.22–5.81)
RDW: 12.5 % (ref 11.5–15.5)
WBC: 12.1 10*3/uL — ABNORMAL HIGH (ref 4.0–10.5)
nRBC: 0 % (ref 0.0–0.2)

## 2022-07-24 LAB — RESP PANEL BY RT-PCR (FLU A&B, COVID) ARPGX2
Influenza A by PCR: NEGATIVE
Influenza B by PCR: NEGATIVE
SARS Coronavirus 2 by RT PCR: NEGATIVE

## 2022-07-24 MED ORDER — OFLOXACIN 0.3 % OP SOLN
5.0000 [drp] | Freq: Every day | OPHTHALMIC | Status: DC
  Administered 2022-07-24: 5 [drp] via OTIC
  Filled 2022-07-24: qty 5

## 2022-07-24 MED ORDER — LACTATED RINGERS IV BOLUS
1000.0000 mL | Freq: Once | INTRAVENOUS | Status: AC
  Administered 2022-07-24: 1000 mL via INTRAVENOUS

## 2022-07-24 MED ORDER — IOHEXOL 350 MG/ML SOLN
75.0000 mL | Freq: Once | INTRAVENOUS | Status: AC | PRN
  Administered 2022-07-24: 75 mL via INTRAVENOUS

## 2022-07-24 NOTE — ED Provider Notes (Signed)
11:02 PM Assumed care from Dr. Matilde Sprang, please see their note for full history, physical and decision making until this point. In brief this is a 76 y.o. year old male who presented to the ED tonight with headach and Weakness (Weakness right arm and right leg for a "few" minutes now subsided)     Head CT to eval for malignant otitis externa, likely d/c.   Head ct abnormal but not a good view of temporal bones/mastoid area, ct temporal bones ordered and c/w otomastoiditis. D/w Dr. Janace Hoard, recommends augmentin and outpatient ENT follow up.   Discharge instructions, including strict return precautions for new or worsening symptoms, given. Patient and/or family verbalized understanding and agreement with the plan as described.   Labs, studies and imaging reviewed by myself and considered in medical decision making if ordered. Imaging interpreted by radiology.  Labs Reviewed  COMPREHENSIVE METABOLIC PANEL - Abnormal; Notable for the following components:      Result Value   Sodium 132 (*)    Creatinine, Ser 1.52 (*)    Calcium 8.6 (*)    Total Protein 6.0 (*)    Albumin 3.0 (*)    GFR, Estimated 47 (*)    All other components within normal limits  CBC WITH DIFFERENTIAL/PLATELET - Abnormal; Notable for the following components:   WBC 12.1 (*)    RBC 3.54 (*)    Hemoglobin 11.3 (*)    HCT 35.8 (*)    MCV 101.1 (*)    Neutro Abs 9.5 (*)    Monocytes Absolute 1.4 (*)    Abs Immature Granulocytes 0.11 (*)    All other components within normal limits  RESP PANEL BY RT-PCR (FLU A&B, COVID) ARPGX2    CT Head W or Wo Contrast    (Results Pending)  DG Chest 2 View    (Results Pending)    No follow-ups on file.    Makeyla Govan, Corene Cornea, MD 07/25/22 [phone]

## 2022-07-24 NOTE — ED Notes (Signed)
Wife assisting pt with urinal at this time.

## 2022-07-25 ENCOUNTER — Emergency Department (HOSPITAL_COMMUNITY): Payer: Medicare PPO

## 2022-07-25 MED ORDER — AMOXICILLIN-POT CLAVULANATE 875-125 MG PO TABS
1.0000 | ORAL_TABLET | Freq: Once | ORAL | Status: AC
  Administered 2022-07-25: 1 via ORAL
  Filled 2022-07-25: qty 1

## 2022-07-25 MED ORDER — IOHEXOL 350 MG/ML SOLN
75.0000 mL | Freq: Once | INTRAVENOUS | Status: AC | PRN
  Administered 2022-07-25: 75 mL via INTRAVENOUS

## 2022-07-25 MED ORDER — BENZONATATE 100 MG PO CAPS: 100.0000 mg | ORAL_CAPSULE | Freq: Three times a day (TID) | ORAL | 0 refills | Status: DC | PRN

## 2022-07-25 MED ORDER — AMOXICILLIN-POT CLAVULANATE 875-125 MG PO TABS: 1.0000 | ORAL_TABLET | Freq: Two times a day (BID) | ORAL | 0 refills | Status: DC

## 2022-07-25 NOTE — ED Provider Notes (Signed)
[name] Provider Note  CSN: [id number] Arrival date & time: 07/24/22 1952  Chief Complaint(s) headach and Weakness (Weakness right arm and right leg for a "few" minutes now subsided)  HPI Richard [name] is a 76 y.o. male with PMH T2DM, HTN who presents emergency department for evaluation of left ear pain, headache, fever and cough as well as an episode of weakness and confusion.  Patient was recently diagnosed with an ear infection and was placed on amoxicillin and was given polymyxin drops.  He has also been taking liquid dextromethorphan from the [place] which she tried for the first time tonight prior to going to sleep.  When he awoke, patient was very confused with possible right-sided weakness.  Upon arrival to the emergency department the symptoms have completely resolved.  He is endorsing a persistent cough, left-sided ear pain and headache.  Denies chest pain, abdominal pain, nausea, vomiting or other systemic symptoms.  Currently denies numbness, tingling, weakness or any neurologic complaints.   Past Medical History Past Medical History:  Diagnosis Date   Diabetes mellitus without complication ([place])    Hypertension    prostate ca dx'd 2006   prostatectomy and xrt   Patient Active Problem List   Diagnosis Date Noted   Post traumatic stress disorder (PTSD) 07/15/2014   Vitamin B 12 deficiency 12/12/2013   H/O prostate cancer 12/12/2013   Unspecified essential hypertension 08/10/2013   Other and unspecified hyperlipidemia 08/10/2013   Type II or unspecified type diabetes mellitus without mention of complication, not stated as uncontrolled 08/07/2013   Home Medication(s) Prior to Admission medications   Medication Sig Start Date End Date Taking? Authorizing Provider  amLODipine (NORVASC) 10 MG tablet Take 1 tablet (10 mg total) by mouth daily. 05/06/21   Elayne Snare, MD  amoxicillin (AMOXIL) 500 MG capsule Take 1 capsule (500 mg total) by  mouth 2 (two) times daily for 10 days. 07/23/22 08/02/22  Mar Daring, PA-C  atorvastatin (LIPITOR) 10 MG tablet TAKE 1 TABLET(10 MG) BY MOUTH DAILY 01/14/17   Elayne Snare, MD  Blood Glucose Monitoring Suppl (ACCU-CHEK GUIDE) w/Device KIT Use to check blood sugars daily 04/10/21   Elayne Snare, MD  glimepiride (AMARYL) 2 MG tablet TAKE 1 TABLET (2 MG TOTAL) BY MOUTH DAILY WITH BREAKFAST. 05/12/18   Elayne Snare, MD  glucose blood (ACCU-CHEK GUIDE) test strip Use to check blood sugars daily 04/10/21   Elayne Snare, MD  hydrALAZINE (APRESOLINE) 10 MG tablet Take 10 mg by mouth daily. 09/22/21   [provider]  Lancets Glory Rosebush ULTRASOFT) lancets Use as instructed to check blood sugar 2 times daily 04/01/21   Elayne Snare, MD  Leuprolide Acetate (LUPRON DEPOT, 61-MONTH, IM) Inject into the muscle.    [provider]  losartan (COZAAR) 100 MG tablet Take 1 tablet by mouth daily. 06/25/21   [provider]  metFORMIN (GLUCOPHAGE) 1000 MG tablet Take 1 tablet by mouth in the morning and at bedtime. 09/22/21   [provider]  MYRBETRIQ 50 MG TB24 tablet Take 1 tablet by mouth daily. 09/22/21   [provider]  neomycin-polymyxin-hydrocortisone (CORTISPORIN) OTIC solution Place 3 drops into the left ear 4 (four) times daily. X 7days 07/23/22   Mar Daring, PA-C  pioglitazone (ACTOS) 30 MG tablet TAKE 1 TABLET(30 MG) BY MOUTH DAILY 10/08/16   Elayne Snare, MD  vitamin B-12 (CYANOCOBALAMIN) 500 MCG tablet Take 2 tablets by mouth daily. 09/22/21   [provider]  Past Surgical History No past surgical history on file. Family History Family History  Problem Relation Age of Onset   Hypertension Father    Heart disease Neg Hx    Diabetes Neg Hx     Social History Social History   Tobacco Use   Smoking status: Every  Day   Smokeless tobacco: Never   Allergies Effexor xr [venlafaxine hcl]  Review of Systems Review of Systems  HENT:  Positive for ear discharge and ear pain.   Respiratory:  Positive for cough.   Neurological:  Positive for weakness and headaches.  Psychiatric/Behavioral:  Positive for confusion.     Physical Exam Vital Signs  I have reviewed the triage vital signs BP 129/69   Pulse 89   Temp 98.4 F (36.9 C)   Resp 18   SpO2 97%   Physical Exam Constitutional:      General: He is not in acute distress.    Appearance: Normal appearance.  HENT:     Head: Normocephalic and atraumatic.     Ears:     Comments: Left-sided canal erythema with purulence and tympanic bulging    Nose: No congestion or rhinorrhea.  Eyes:     General:        Right eye: No discharge.        Left eye: No discharge.     Extraocular Movements: Extraocular movements intact.     Pupils: Pupils are equal, round, and reactive to light.  Cardiovascular:     Rate and Rhythm: Normal rate and regular rhythm.     Heart sounds: No murmur heard. Pulmonary:     Effort: No respiratory distress.     Breath sounds: No wheezing or rales.  Abdominal:     General: There is no distension.     Tenderness: There is no abdominal tenderness.  Musculoskeletal:        General: Normal range of motion.     Cervical back: Normal range of motion.  Skin:    General: Skin is warm and dry.  Neurological:     General: No focal deficit present.     Mental Status: He is alert.     ED Results and Treatments Labs (all labs ordered are listed, but only abnormal results are displayed) Labs Reviewed  COMPREHENSIVE METABOLIC PANEL - Abnormal; Notable for the following components:      Result Value   Sodium 132 (*)    Creatinine, Ser 1.52 (*)    Calcium 8.6 (*)    Total Protein 6.0 (*)    Albumin 3.0 (*)    GFR, Estimated 47 (*)    All other components within normal limits  CBC WITH DIFFERENTIAL/PLATELET - Abnormal;  Notable for the following components:   WBC 12.1 (*)    RBC 3.54 (*)    Hemoglobin 11.3 (*)    HCT 35.8 (*)    MCV 101.1 (*)    Neutro Abs 9.5 (*)    Monocytes Absolute 1.4 (*)    Abs Immature Granulocytes 0.11 (*)    All other components within normal limits  RESP PANEL BY RT-PCR (FLU A&B, COVID) ARPGX2  Radiology CT Head W or Wo Contrast  Result Date: 07/24/2022 CLINICAL DATA:  Headache, fever, left ear pain; concern for malignant otitis EXAM: CT HEAD WITHOUT AND WITH CONTRAST TECHNIQUE: Contiguous axial images were obtained from the base of the skull through the vertex without and with intravenous contrast. RADIATION DOSE REDUCTION: This exam was performed according to the departmental dose-optimization program which includes automated exposure control, adjustment of the mA and/or kV according to patient size and/or use of iterative reconstruction technique. CONTRAST:  63m OMNIPAQUE IOHEXOL 350 MG/ML SOLN COMPARISON:  No prior CT of the head, correlation is made with MRI head 03/16/2016 FINDINGS: Brain: No evidence of acute infarction, hemorrhage, mass, mass effect, or midline shift. No hydrocephalus or extra-axial fluid collection. Vascular: No hyperdense vessel. Skull: Normal. Negative for fracture or focal lesion. Sinuses/Orbits: No acute finding. Other: Fluid in the left mastoid air cells. Although this study is not designed for detailed evaluation of the temporal bones, there is no evidence of erosion of the mastoid septa or associated abscess. Fluid is noted throughout the left middle ear. IMPRESSION: 1. No acute intracranial process. 2. Fluid in the left mastoid air cells and left middle ear, which can be seen in the setting of otomastoiditis. No evidence of erosion of the mastoid septa or associated abscess, although this study is not designed for detailed evaluation  the temporal bones. Electronically Signed   By: AMerilyn BabaM.D.   On: 07/24/2022 23:31   DG Chest 2 View  Result Date: 07/24/2022 CLINICAL DATA:  dyspnea, r/o PNA EXAM: CHEST - 2 VIEW COMPARISON:  Chest x-ray 11/23/2004 FINDINGS: The heart and mediastinal contours are unchanged. Aortic calcification. No focal consolidation. No pulmonary edema. No pleural effusion. No pneumothorax. No acute osseous abnormality. IMPRESSION: 1. No active cardiopulmonary disease. 2.  Aortic Atherosclerosis (ICD10-I70.0). Electronically Signed   By: MIven FinnM.D.   On: 07/24/2022 23:10    Pertinent labs & imaging results that were available during my care of the patient were reviewed by me and considered in my medical decision making (see MDM for details).  Medications Ordered in ED Medications  ofloxacin (OCUFLOX) 0.3 % ophthalmic solution 5 drop (5 drops Left EAR Given 07/24/22 2335)  lactated ringers bolus 1,000 mL (0 mLs Intravenous Stopped 07/24/22 2335)  iohexol (OMNIPAQUE) 350 MG/ML injection 75 mL (75 mLs Intravenous Contrast Given 07/24/22 2324)                                                                                                                                     Procedures Procedures  (including critical care time)  Medical Decision Making / ED Course   This patient presents to the ED for concern of confusion, weakness, headache, ear pain, cough, this involves an extensive number of treatment options, and is a complaint that carries with it a high risk of complications and morbidity.  The differential diagnosis includes otitis media,  otitis externa, malignant otitis externa, medication side effect, TIA, Saturday night palsy, pneumonia  MDM: Patient seen emergency room for evaluation of multiple complaints described above.  Physical exam with left-sided canal erythema with tympanic bulging and purulence.  Neurologic exam unremarkable.  Ofloxacin drops ordered for the ear.   Laboratory evaluation with a leukocytosis of 12.1, hemoglobin 11.3 but is otherwise unremarkable.  COVID and flu negative.  Chest x-ray unremarkable.  At time of signout, patient pending CT head with contrast to evaluate for malignant otitis externa in the setting of his headache and fever.  Please see provider signout for continuation of workup.  In regards to the patient's episode of confusion.  Suspect medication side effect secondary to dextromethorphan given that all of his symptoms have resolved including his altered mental status.  Low suspicion for stroke or TIA at this time.   Additional history obtained: -Additional history obtained from wife -External records from outside source obtained and reviewed including: Chart review including previous notes, labs, imaging, consultation notes   Lab Tests: -I ordered, reviewed, and interpreted labs.   The pertinent results include:   Labs Reviewed  COMPREHENSIVE METABOLIC PANEL - Abnormal; Notable for the following components:      Result Value   Sodium 132 (*)    Creatinine, Ser 1.52 (*)    Calcium 8.6 (*)    Total Protein 6.0 (*)    Albumin 3.0 (*)    GFR, Estimated 47 (*)    All other components within normal limits  CBC WITH DIFFERENTIAL/PLATELET - Abnormal; Notable for the following components:   WBC 12.1 (*)    RBC 3.54 (*)    Hemoglobin 11.3 (*)    HCT 35.8 (*)    MCV 101.1 (*)    Neutro Abs 9.5 (*)    Monocytes Absolute 1.4 (*)    Abs Immature Granulocytes 0.11 (*)    All other components within normal limits  RESP PANEL BY RT-PCR (FLU A&B, COVID) ARPGX2     Imaging Studies ordered: I ordered imaging studies including chest x-ray I independently visualized and interpreted imaging. I agree with the radiologist interpretation   CT head with contrast is pending  Medicines ordered and prescription drug management: Meds ordered this encounter  Medications   lactated ringers bolus 1,000 mL   ofloxacin (OCUFLOX) 0.3 %  ophthalmic solution 5 drop   iohexol (OMNIPAQUE) 350 MG/ML injection 75 mL    -I have reviewed the patients home medicines and have made adjustments as needed  Critical interventions none  Cardiac Monitoring: The patient was maintained on a cardiac monitor.  I personally viewed and interpreted the cardiac monitored which showed an underlying rhythm of: NSR  Social Determinants of Health:  Factors impacting patients care include: none   Reevaluation: After the interventions noted above, I reevaluated the patient and found that they have :improved  Co morbidities that complicate the patient evaluation  Past Medical History:  Diagnosis Date   Diabetes mellitus without complication ([place])    Hypertension    prostate ca dx'd 2006   prostatectomy and xrt      Dispostion: I considered admission for this patient, and disposition pending imaging studies.  Please see provider signout for continuation of workup     Final Clinical Impression(s) / ED Diagnoses Final diagnoses:  None     _0 @    Teressa Lower, MD 07/25/22 0126

## 2022-07-25 NOTE — ED Notes (Signed)
Patient discharged wit wife. Instructions reviewed, medication administered. Patient wheeled to car. Alert and oriented x 4 and ambulated normal without assistant.

## 2022-07-25 NOTE — Discharge Instructions (Signed)
Use the drops we gave you in  your left ear twice daily for 7 days.  Coricidin for congestion  Tessalon perles for cough  STOP the amoxicillin and start the Augmentin with the prescription I have given you

## 2022-07-26 ENCOUNTER — Telehealth: Payer: Medicare PPO | Admitting: Physician Assistant

## 2022-07-26 DIAGNOSIS — R531 Weakness: Secondary | ICD-10-CM | POA: Diagnosis not present

## 2022-07-26 DIAGNOSIS — H66002 Acute suppurative otitis media without spontaneous rupture of ear drum, left ear: Secondary | ICD-10-CM

## 2022-07-26 MED ORDER — CIPROFLOXACIN-DEXAMETHASONE 0.3-0.1 % OT SUSP: 4.0000 [drp] | Freq: Two times a day (BID) | OTIC | 0 refills | Status: DC

## 2022-07-26 NOTE — Progress Notes (Signed)
Virtual Visit Consent   Richard [name], you are scheduled for a virtual visit with a [facility] provider today. Just as with appointments in the office, your consent must be obtained to participate. Your consent will be active for this visit and any virtual visit you may have with one of our providers in the next 365 days. If you have a MyChart account, a copy of this consent can be sent to you electronically.  As this is a virtual visit, video technology does not allow for your provider to perform a traditional examination. This may limit your provider's ability to fully assess your condition. If your provider identifies any concerns that need to be evaluated in person or the need to arrange testing (such as labs, EKG, etc.), we will make arrangements to do so. Although advances in technology are sophisticated, we cannot ensure that it will always work on either your end or our end. If the connection with a video visit is poor, the visit may have to be switched to a telephone visit. With either a video or telephone visit, we are not always able to ensure that we have a secure connection.  By engaging in this virtual visit, you consent to the provision of healthcare and authorize for your insurance to be billed (if applicable) for the services provided during this visit. Depending on your insurance coverage, you may receive a charge related to this service.  I need to obtain your verbal consent now. Are you willing to proceed with your visit today? Richard [name] has provided verbal consent on 07/26/2022 for a virtual visit (video or telephone). Mar Daring, PA-C  Date: 07/26/2022 1:31 PM  Virtual Visit via Video Note   I, Mar Daring, connected with  Richard [name]  ([id number], 06-Jul-1946) on 07/26/22 at 12:30 PM EST by a video-enabled telemedicine application and verified that I am speaking with the correct person using two identifiers.  Location: Patient: Virtual Visit  Location Patient: Home Provider: Virtual Visit Location Provider: Home Office   I discussed the limitations of evaluation and management by telemedicine and the availability of in person appointments. The patient expressed understanding and agreed to proceed.    History of Present Illness: Richard [name] is a 76 y.o. who identifies as a male who was assigned male at birth, and is being seen today for needing a refill of Ofloxacin drops for an ear infection. Was seen virtually on 07/23/22 and diagnosed with left otitis media. Started on Cortisporin drops and amoxicillin. Symptoms worsened overnight and he had some transient confusion and weakness (suspected to have been from a DM medication for cough and congestion used at bedtime). Treatment was changed to Augmentin and Ofloxacin drops for the same diagnosis. Reports he is continuing treatment but has run out of ofloxacin drops as he was to continue this for 7 days as well.   Had weakness in right hand and leg on 07/24/22 and went to ER, all work up was negative. But then had this happen again this morning. Occurs after sleeping both times. Felt like he was choking last night and woke up his wife for that. Then he woke up again with the numbness and weakness. Has had CT head and CT temporal bones with no acute findings worrisome for TIA/CVA or vasculitis noted. Did have acute otomastoiditis.    Problems:  Patient Active Problem List   Diagnosis Date Noted   Post traumatic stress disorder (PTSD) 07/15/2014   Vitamin B  12 deficiency 12/12/2013   H/O prostate cancer 12/12/2013   Unspecified essential hypertension 08/10/2013   Other and unspecified hyperlipidemia 08/10/2013   Type II or unspecified type diabetes mellitus without mention of complication, not stated as uncontrolled 08/07/2013    Allergies:  Allergies  Allergen Reactions   Effexor Xr [Venlafaxine Hcl] Rash   Medications:  Current Outpatient Medications:    amLODipine (NORVASC)  10 MG tablet, Take 1 tablet (10 mg total) by mouth daily., Disp: 90 tablet, Rfl: 2   amoxicillin (AMOXIL) 500 MG capsule, Take 1 capsule (500 mg total) by mouth 2 (two) times daily for 10 days., Disp: 20 capsule, Rfl: 0   amoxicillin-clavulanate (AUGMENTIN) 875-125 MG tablet, Take 1 tablet by mouth every 12 (twelve) hours., Disp: 14 tablet, Rfl: 0   atorvastatin (LIPITOR) 10 MG tablet, TAKE 1 TABLET(10 MG) BY MOUTH DAILY, Disp: 90 tablet, Rfl: 0   benzonatate (TESSALON) 100 MG capsule, Take 1 capsule (100 mg total) by mouth 3 (three) times daily as needed for cough., Disp: 21 capsule, Rfl: 0   Blood Glucose Monitoring Suppl (ACCU-CHEK GUIDE) w/Device KIT, Use to check blood sugars daily, Disp: 1 kit, Rfl: 0   ciprofloxacin-dexamethasone (CIPRODEX) OTIC suspension, Place 4 drops into the left ear 2 (two) times daily., Disp: 7.5 mL, Rfl: 0   glimepiride (AMARYL) 2 MG tablet, TAKE 1 TABLET (2 MG TOTAL) BY MOUTH DAILY WITH BREAKFAST., Disp: 90 tablet, Rfl: 0   glucose blood (ACCU-CHEK GUIDE) test strip, Use to check blood sugars daily, Disp: 100 each, Rfl: 2   hydrALAZINE (APRESOLINE) 10 MG tablet, Take 10 mg by mouth daily., Disp: , Rfl:    Lancets (ONETOUCH ULTRASOFT) lancets, Use as instructed to check blood sugar 2 times daily, Disp: 100 each, Rfl: 11   Leuprolide Acetate (LUPRON DEPOT, 95-MONTH, IM), Inject into the muscle., Disp: , Rfl:    losartan (COZAAR) 100 MG tablet, Take 1 tablet by mouth daily., Disp: , Rfl:    metFORMIN (GLUCOPHAGE) 1000 MG tablet, Take 1 tablet by mouth in the morning and at bedtime., Disp: , Rfl:    MYRBETRIQ 50 MG TB24 tablet, Take 1 tablet by mouth daily., Disp: , Rfl:    pioglitazone (ACTOS) 30 MG tablet, TAKE 1 TABLET(30 MG) BY MOUTH DAILY, Disp: 90 tablet, Rfl: 0   vitamin B-12 (CYANOCOBALAMIN) 500 MCG tablet, Take 2 tablets by mouth daily., Disp: , Rfl:   Observations/Objective: Patient is well-developed, well-nourished in no acute distress.  Resting comfortably  at home.  Head is normocephalic, atraumatic.  No labored breathing.  Speech is clear and coherent with logical content.  Patient is alert and oriented at baseline.    Assessment and Plan: 1. Non-recurrent acute suppurative otitis media of left ear without spontaneous rupture of tympanic membrane - ciprofloxacin-dexamethasone (CIPRODEX) OTIC suspension; Place 4 drops into the left ear 2 (two) times daily.  Dispense: 7.5 mL; Refill: 0  2. Acute right-sided weakness  - Will change therapy to include Ciprodex drops to get some steroid without affecting his blood sugars - Hospital visit with labs and imaging all reviewed by me from 07/24/22 ER visit - Continue Augmentin as symptoms are improving; Does report feeling the strongest he has felt in a week - Did discuss adding in ASA 49m just in case his symptoms were consistent with TIA, however suspect symptoms may be related more with possible vasospasm from infection - Initially had thought was secondary to DM medication, however, he had not had that medication last night,  so less likely medication side effect as previously expected - Keep scheduled follow up with PCP - Seek immediate care in person if right sided weakness recurs  Follow Up Instructions: I discussed the assessment and treatment plan with the patient. The patient was provided an opportunity to ask questions and all were answered. The patient agreed with the plan and demonstrated an understanding of the instructions.  A copy of instructions were sent to the patient via MyChart unless otherwise noted below.    The patient was advised to call back or seek an in-person evaluation if the symptoms worsen or if the condition fails to improve as anticipated.  Time:  I spent 22 minutes with the patient via telehealth technology discussing the above problems/concerns.    Mar Daring, PA-C

## 2022-07-26 NOTE — Patient Instructions (Signed)
Richard [name], thank you for joining Mar Daring, PA-C for today's virtual visit.  While this provider is not your primary care provider (PCP), if your PCP is located in our provider database this encounter information will be shared with them immediately following your visit.   [facility] account gives you access to today's visit and all your visits, tests, and labs performed at [facility] " click here if you don't have a [facility] account or go to [email or web address]  Consent: (Patient) Richard [name] provided verbal consent for this virtual visit at the beginning of the encounter.  Current Medications:  Current Outpatient Medications:    amLODipine (NORVASC) 10 MG tablet, Take 1 tablet (10 mg total) by mouth daily., Disp: 90 tablet, Rfl: 2   amoxicillin (AMOXIL) 500 MG capsule, Take 1 capsule (500 mg total) by mouth 2 (two) times daily for 10 days., Disp: 20 capsule, Rfl: 0   amoxicillin-clavulanate (AUGMENTIN) 875-125 MG tablet, Take 1 tablet by mouth every 12 (twelve) hours., Disp: 14 tablet, Rfl: 0   atorvastatin (LIPITOR) 10 MG tablet, TAKE 1 TABLET(10 MG) BY MOUTH DAILY, Disp: 90 tablet, Rfl: 0   benzonatate (TESSALON) 100 MG capsule, Take 1 capsule (100 mg total) by mouth 3 (three) times daily as needed for cough., Disp: 21 capsule, Rfl: 0   Blood Glucose Monitoring Suppl (ACCU-CHEK GUIDE) w/Device KIT, Use to check blood sugars daily, Disp: 1 kit, Rfl: 0   ciprofloxacin-dexamethasone (CIPRODEX) OTIC suspension, Place 4 drops into the left ear 2 (two) times daily., Disp: 7.5 mL, Rfl: 0   glimepiride (AMARYL) 2 MG tablet, TAKE 1 TABLET (2 MG TOTAL) BY MOUTH DAILY WITH BREAKFAST., Disp: 90 tablet, Rfl: 0   glucose blood (ACCU-CHEK GUIDE) test strip, Use to check blood sugars daily, Disp: 100 each, Rfl: 2   hydrALAZINE (APRESOLINE) 10 MG tablet, Take 10 mg by mouth daily., Disp: , Rfl:    Lancets (ONETOUCH ULTRASOFT) lancets, Use as  instructed to check blood sugar 2 times daily, Disp: 100 each, Rfl: 11   Leuprolide Acetate (LUPRON DEPOT, 47-MONTH, IM), Inject into the muscle., Disp: , Rfl:    losartan (COZAAR) 100 MG tablet, Take 1 tablet by mouth daily., Disp: , Rfl:    metFORMIN (GLUCOPHAGE) 1000 MG tablet, Take 1 tablet by mouth in the morning and at bedtime., Disp: , Rfl:    MYRBETRIQ 50 MG TB24 tablet, Take 1 tablet by mouth daily., Disp: , Rfl:    pioglitazone (ACTOS) 30 MG tablet, TAKE 1 TABLET(30 MG) BY MOUTH DAILY, Disp: 90 tablet, Rfl: 0   vitamin B-12 (CYANOCOBALAMIN) 500 MCG tablet, Take 2 tablets by mouth daily., Disp: , Rfl:    Medications ordered in this encounter:  Meds ordered this encounter  Medications   ciprofloxacin-dexamethasone (CIPRODEX) OTIC suspension    Sig: Place 4 drops into the left ear 2 (two) times daily.    Dispense:  7.5 mL    Refill:  0    Order Specific Question:   Supervising Provider    Answer:   Chase Picket [[id number]]     *If you need refills on other medications prior to your next appointment, please contact your pharmacy*  Follow-Up: Call back or seek an in-person evaluation if the symptoms worsen or if the condition fails to improve as anticipated.  [facility] [phone]  Other Instructions  Transient Ischemic Attack A transient ischemic attack (TIA) happens when blood supply to the brain is  blocked temporarily. A TIA causes stroke-like symptoms that go away quickly without causing any permanent damage. Having a TIA can be considered a warning sign for a stroke and should not be ignored. A person who has a TIA is at higher risk for a stroke. What are the causes? This condition is caused by a temporary blockage in an artery in the head or neck. This means the brain does not get the blood supply it needs. A blockage can be caused by: Fatty buildup in an artery in the head or neck (atherosclerosis). A blood clot traveling from the heart. An artery  tear (dissection). Inflammation of an artery (vasculitis). Sometimes the cause is not known. What increases the risk? Certain factors make you more likely to develop this condition. Some of these are things you can change, including: Using products that contain nicotine or tobacco. Being inactive. Heavy alcohol use. Drug use, especially cocaine and methamphetamine. Medical conditions that may increase your risk include: High blood pressure (hypertension). High cholesterol. Diabetes. Heart disease (coronary artery disease). An irregular heartbeat, also called atrial fibrillation (AFib). Sickle cell disease. Blood clotting disorders (hypercoagulable state). Other risk factors include: Being over the age of 46. Being male. Obesity. Sleep problems such as sleep apnea. Family history of stroke. Previous history of blood clots, stroke, TIA, or heart attack. What are the signs or symptoms? Symptoms of a TIA are the same as those of a stroke. The symptoms develop suddenly, and then go away quickly. They may include: Dizziness, loss of balance and coordination, or trouble walking. Vision changes, such as double vision, blurred vision, or loss of vision. Weakness or numbness in your face, arm, or leg, especially on one side of your body. Trouble speaking, understanding speech, or both (aphasia). Nausea and vomiting. Severe headache. Confusion. If possible, note what time your symptoms started. Tell your health care provider. How is this diagnosed? This condition may be diagnosed based on: Your symptoms and medical history. A physical exam. Imaging tests, usually a CT scan or MRI of the brain. Blood tests. You may also have other tests, including: Electrocardiogram (ECG). Echocardiogram. Continuous heart monitoring. Carotid ultrasound. A scan of blood circulation in the brain (CT angiogram or MR angiogram). How is this treated? The goal of treatment is to reduce the risk for a  stroke. Stroke prevention therapies may include: Changes to diet and lifestyle, such as being physically active and stopping smoking. Treating other health conditions, such as diabetes or AFib. Medicines to thin the blood (antiplatelets or anticoagulants). Blood pressure medicines. Medicines to reduce cholesterol. If testing shows a narrowing in the arteries to your brain, your health care provider may recommend a procedure, such as: Carotid endarterectomy. This is done to remove the blockage from your artery. Carotid angioplasty and stenting. This uses a small mesh tube (stent) to open or widen an artery in the neck. The stent helps keep the artery open by supporting the artery walls. Follow these instructions at home: Medicines Take over-the-counter and prescription medicines only as told by your health care provider. If you were told to take a medicine to thin your blood, such as aspirin or an anticoagulant, use it exactly as told by your health care provider. Taking too much blood-thinning medicine can cause bleeding. Taking too little will not protect you against a stroke and other problems. Eating and drinking  Eat 5 or more servings of fruits and vegetables each day. Follow guidelines from your health care provider about your diet. You may  need to follow a certain diet to help manage risk factors for stroke. This may include: Eating a low-fat, low-salt diet. Choosing high-fiber foods. Limiting carbohydrates and sugar. If you drink alcohol: Limit how much you have to: 0-1 drink a day for women who are not pregnant. 0-2 drinks a day for men. Know how much alcohol is in your drink. In the [place] one drink equals one 12 oz bottle of beer (355 mL), one 5 oz glass of wine (148 mL), or one 1 oz glass of hard liquor (44 mL). General instructions Maintain a healthy weight. Try to get at least 30 minutes of exercise on most days. Get treatment if you have sleep apnea. Do not use any  products that contain nicotine or tobacco. These products include cigarettes, chewing tobacco, and vaping devices, such as e-cigarettes. If you need help quitting, ask your health care provider. Do not use illegal drugs. Keep all follow-up visits. Your health care provider will want to know if you have any more symptoms and to check blood labs if any medicines were prescribed. Where to find more information American Stroke Association: stroke.org Get help right away if: You have chest pain. You have fast or irregular heartbeats (palpitations). You have any symptoms of a stroke. "BE FAST" is an easy way to remember the main warning signs of a stroke. B - Balance. Signs are dizziness, sudden trouble walking, or loss of balance. E - Eyes. Signs are trouble seeing or a sudden change in vision. F - Face. Signs are sudden weakness or numbness of the face, or the face or eyelid drooping on one side. A - Arms. Signs are weakness or numbness in an arm. This happens suddenly and usually on one side of the body. S - Speech.Signs are sudden trouble speaking, slurred speech, or trouble understanding what people say. T - Time. Time to call emergency services. Write down what time symptoms started. You have other signs of a stroke, such as: A sudden, severe headache with no known cause. Nausea or vomiting. Seizure. These symptoms may be an emergency. Get help right away. Call 911. Do not wait to see if the symptoms will go away. Do not drive yourself to the hospital. This information is not intended to replace advice given to you by your health care provider. Make sure you discuss any questions you have with your health care provider. Document Revised: 01/29/2022 Document Reviewed: 01/29/2022 Elsevier Patient Education  [place].    If you have been instructed to have an in-person evaluation today at a local Urgent Care facility, please use the link below. It will take you to a list of all of  our available [facility], including address, phone number and hours of operation. Please do not delay care.  [facility]  If you or a family member do not have a primary care provider, use the link below to schedule a visit and establish care. When you choose a [facility] primary care physician or advanced practice provider, you gain a long-term partner in health. Find a Primary Care Provider  Learn more about [facility] in-office and virtual care options: [facility]

## 2022-07-27 ENCOUNTER — Ambulatory Visit (HOSPITAL_COMMUNITY)
Admission: RE | Admit: 2022-07-27 | Discharge: 2022-07-27 | Disposition: A | Discharge status: Home / Self Care | Payer: Medicare PPO | Source: Ambulatory Visit | Attending: Emergency Medicine | Admitting: Emergency Medicine

## 2022-07-27 ENCOUNTER — Emergency Department (HOSPITAL_BASED_OUTPATIENT_CLINIC_OR_DEPARTMENT_OTHER)
Admission: EM | Admit: 2022-07-27 | Discharge: 2022-07-27 | Disposition: A | Discharge status: Home / Self Care | Payer: Medicare PPO | Attending: Emergency Medicine | Admitting: Emergency Medicine

## 2022-07-27 ENCOUNTER — Encounter: Payer: Self-pay | Admitting: Physician Assistant

## 2022-07-27 ENCOUNTER — Encounter (HOSPITAL_COMMUNITY): Payer: Self-pay

## 2022-07-27 ENCOUNTER — Emergency Department (HOSPITAL_BASED_OUTPATIENT_CLINIC_OR_DEPARTMENT_OTHER): Payer: Medicare PPO

## 2022-07-27 VITALS — BP 123/70 | HR 83 | Temp 97.6°F | Resp 18

## 2022-07-27 DIAGNOSIS — R42 Dizziness and giddiness: Secondary | ICD-10-CM | POA: Insufficient documentation

## 2022-07-27 DIAGNOSIS — R531 Weakness: Secondary | ICD-10-CM | POA: Diagnosis not present

## 2022-07-27 DIAGNOSIS — Z7984 Long term (current) use of oral hypoglycemic drugs: Secondary | ICD-10-CM | POA: Diagnosis not present

## 2022-07-27 DIAGNOSIS — E119 Type 2 diabetes mellitus without complications: Secondary | ICD-10-CM | POA: Insufficient documentation

## 2022-07-27 DIAGNOSIS — R2990 Unspecified symptoms and signs involving the nervous system: Secondary | ICD-10-CM | POA: Diagnosis not present

## 2022-07-27 DIAGNOSIS — E162 Hypoglycemia, unspecified: Secondary | ICD-10-CM

## 2022-07-27 LAB — COMPREHENSIVE METABOLIC PANEL
ALT: 15 U/L (ref 0–44)
AST: 21 U/L (ref 15–41)
Albumin: 3.7 g/dL (ref 3.5–5.0)
Alkaline Phosphatase: 51 U/L (ref 38–126)
Anion gap: 9 (ref 5–15)
BUN: 24 mg/dL — ABNORMAL HIGH (ref 8–23)
CO2: 23 mmol/L (ref 22–32)
Calcium: 8.5 mg/dL — ABNORMAL LOW (ref 8.9–10.3)
Chloride: 96 mmol/L — ABNORMAL LOW (ref 98–111)
Creatinine, Ser: 1.53 mg/dL — ABNORMAL HIGH (ref 0.61–1.24)
GFR, Estimated: 47 mL/min — ABNORMAL LOW (ref >60)
Glucose, Bld: 100 mg/dL — ABNORMAL HIGH (ref 70–99)
Potassium: 4.7 mmol/L (ref 3.5–5.1)
Sodium: 128 mmol/L — ABNORMAL LOW (ref 135–145)
Total Bilirubin: 0.3 mg/dL (ref 0.3–1.2)
Total Protein: 6.3 g/dL — ABNORMAL LOW (ref 6.5–8.1)

## 2022-07-27 LAB — CBC WITH DIFFERENTIAL/PLATELET
Abs Immature Granulocytes: 0.15 10*3/uL — ABNORMAL HIGH (ref 0.00–0.07)
Basophils Absolute: 0.1 10*3/uL (ref 0.0–0.1)
Basophils Relative: 1 %
Eosinophils Absolute: 0.1 10*3/uL (ref 0.0–0.5)
Eosinophils Relative: 1 %
HCT: 32.4 % — ABNORMAL LOW (ref 39.0–52.0)
Hemoglobin: 10.8 g/dL — ABNORMAL LOW (ref 13.0–17.0)
Immature Granulocytes: 2 %
Lymphocytes Relative: 23 %
Lymphs Abs: 2.1 10*3/uL (ref 0.7–4.0)
MCH: 32.1 pg (ref 26.0–34.0)
MCHC: 33.3 g/dL (ref 30.0–36.0)
MCV: 96.4 fL (ref 80.0–100.0)
Monocytes Absolute: 0.9 10*3/uL (ref 0.1–1.0)
Monocytes Relative: 10 %
Neutro Abs: 6.1 10*3/uL (ref 1.7–7.7)
Neutrophils Relative %: 63 %
Platelets: 337 10*3/uL (ref 150–400)
RBC: 3.36 MIL/uL — ABNORMAL LOW (ref 4.22–5.81)
RDW: 12.6 % (ref 11.5–15.5)
WBC: 9.5 10*3/uL (ref 4.0–10.5)
nRBC: 0 % (ref 0.0–0.2)

## 2022-07-27 LAB — CBG MONITORING, ED
Glucose-Capillary: 58 mg/dL — ABNORMAL LOW (ref 70–99)
Glucose-Capillary: 100 mg/dL — ABNORMAL HIGH (ref 70–99)
Glucose-Capillary: 74 mg/dL (ref 70–99)

## 2022-07-27 LAB — URINALYSIS, ROUTINE W REFLEX MICROSCOPIC
Bilirubin Urine: NEGATIVE
Glucose, UA: NEGATIVE mg/dL
Ketones, ur: NEGATIVE mg/dL
Leukocytes,Ua: NEGATIVE
Nitrite: NEGATIVE
Protein, ur: NEGATIVE mg/dL
Specific Gravity, Urine: 1.007 (ref 1.005–1.030)
pH: 5.5 (ref 5.0–8.0)

## 2022-07-27 MED ORDER — IOHEXOL 300 MG/ML  SOLN
100.0000 mL | Freq: Once | INTRAMUSCULAR | Status: AC | PRN
  Administered 2022-07-27: 75 mL via INTRAVENOUS

## 2022-07-27 NOTE — Discharge Instructions (Addendum)
We talked about your episodes of weakness and dizziness, which I think may be related to low blood sugar.  It is very important that you are eating regular meals throughout the day.  Your sodium level was low, which shows that you have not been eating as much is normal.  Low blood sugar can cause strokelike symptoms.  If you get and develop weakness again, ask your wife to check her sugar, and then immediately take juice or sugary beverage to help with low blood sugar.  Your symptoms should improve quickly.  If your symptoms are persisting after more than 5 minutes, please call 911 to return to the ER.  I advise that you stop your glimepiride at this time.  Continue with your metformin, Actos, and all of your other usual medications.  Keep a daily diary of your blood sugar which you check in the morning.  Call your endocrinologist to schedule follow-up appointment in 2 weeks.

## 2022-07-27 NOTE — ED Triage Notes (Signed)
Pt was sick all last week dx with ear infection. Saturday night had stroke like symptoms and called 911 was taken to ED. Scans showed bad ear infection and updated antibiotics, covid and flu negative.  Sunday night similar behaviors and last night symptoms was worse-left leg and arm were useless and unable to get out what needed to say. EMS called, blood sugar was in 50s last night. EMS got patient to eat PB&J, juice and then advised to see doctor. Pt states that he sees an Endocrinologist every 6 months and stuff been good.

## 2022-07-27 NOTE — Discharge Instructions (Addendum)
Please proceed to the ED for further evaluation.  

## 2022-07-27 NOTE — ED Provider Triage Note (Signed)
Emergency Medicine Provider Triage Evaluation Note  Richard [name] , a 76 y.o. male  was evaluated in triage.  Pt complains of intermittent coordination issues for the past few days. It is episodic. Last one was today at 0400 and lasted from 45-60 minutes. Reports that sometimes it is the right side with upper and lower, and sometimes it is the left side with upper and lower. Usually happens after he has been laying down for a while.  Review of Systems  Positive:  Negative:   Physical Exam  BP 127/68 (BP Location: Right Arm)   Pulse 70   Temp 97.6 F (36.4 C)   Resp 18   Ht 5' 10.5" (1.791 m)   Wt 97.5 kg   SpO2 96%   BMI 30.41 kg/m  Gen:   Awake, no distress   Resp:  Normal effort  MSK:   Moves extremities without difficulty  Other:  Strength equal in upper and lower bilateral extremities.   Medical Decision Making  Medically screening exam initiated at 3:28 PM.  Appropriate orders placed.  Julienne Kass was informed that the remainder of the evaluation will be completed by another provider, this initial triage assessment does not replace that evaluation, and the importance of remaining in the ED until their evaluation is complete.  Spoke with my attending regarding this patient. Will order CT iamging.    Sherrell Puller, [place] 07/27/22 1533

## 2022-07-27 NOTE — ED Provider Notes (Signed)
[place]    CSN: [id number] Arrival date & time: 07/27/22  1233      History   Chief Complaint Chief Complaint  Patient presents with   appt 1    HPI Richard [name] is a 76 y.o. male.  Presents after a couple episodes of strokelike symptoms Over the past 3 nights he has had unilateral weakness and speech issues.  They only occur in the middle of the night.  He is fine during the day. EMS was called for each episode and he had low blood sugars at those times.  50s to 60s. Last night EMS monitored him and recommended to follow-up with provider today.  He was seen in the emergency department after the first 2 episodes. At that time negative head CT apart from mastoiditis treated with IV and oral antibiotics.  On presentation today he has no current symptoms. Wife is bedside and reports last night was the worst of the episodes.  He was unable to walk and could not speak.  He is not currently having any weakness.  Patient is alert and oriented, answering questions on his own   Past Medical History:  Diagnosis Date   Diabetes mellitus without complication ([name])    Hypertension    prostate ca dx'd 2006   prostatectomy and xrt    Patient Active Problem List   Diagnosis Date Noted   Post traumatic stress disorder (PTSD) 07/15/2014   Vitamin B 12 deficiency 12/12/2013   H/O prostate cancer 12/12/2013   Unspecified essential hypertension 08/10/2013   Other and unspecified hyperlipidemia 08/10/2013   Type II or unspecified type diabetes mellitus without mention of complication, not stated as uncontrolled 08/07/2013    History reviewed. No pertinent surgical history.     Home Medications    Prior to Admission medications   Medication Sig Start Date End Date Taking? Authorizing Provider  amLODipine (NORVASC) 10 MG tablet Take 1 tablet (10 mg total) by mouth daily. 05/06/21   Elayne Snare, MD  amoxicillin (AMOXIL) 500 MG capsule Take 1 capsule (500 mg total)  by mouth 2 (two) times daily for 10 days. 07/23/22 08/02/22  Mar Daring, PA-C  amoxicillin-clavulanate (AUGMENTIN) 875-125 MG tablet Take 1 tablet by mouth every 12 (twelve) hours. 07/25/22   Mesner, Corene Cornea, MD  atorvastatin (LIPITOR) 10 MG tablet TAKE 1 TABLET(10 MG) BY MOUTH DAILY 01/14/17   Elayne Snare, MD  benzonatate (TESSALON) 100 MG capsule Take 1 capsule (100 mg total) by mouth 3 (three) times daily as needed for cough. 07/25/22   Mesner, Corene Cornea, MD  Blood Glucose Monitoring Suppl (ACCU-CHEK GUIDE) w/Device KIT Use to check blood sugars daily 04/10/21   Elayne Snare, MD  ciprofloxacin-dexamethasone (CIPRODEX) OTIC suspension Place 4 drops into the left ear 2 (two) times daily. 07/26/22   Mar Daring, PA-C  glimepiride (AMARYL) 2 MG tablet TAKE 1 TABLET (2 MG TOTAL) BY MOUTH DAILY WITH BREAKFAST. 05/12/18   Elayne Snare, MD  glucose blood (ACCU-CHEK GUIDE) test strip Use to check blood sugars daily 04/10/21   Elayne Snare, MD  hydrALAZINE (APRESOLINE) 10 MG tablet Take 10 mg by mouth daily. 09/22/21   [provider]  Lancets Glory Rosebush ULTRASOFT) lancets Use as instructed to check blood sugar 2 times daily 04/01/21   Elayne Snare, MD  Leuprolide Acetate (LUPRON DEPOT, 52-MONTH, IM) Inject into the muscle.    [provider]  losartan (COZAAR) 100 MG tablet Take 1 tablet by mouth daily. 06/25/21   [provider]  metFORMIN (GLUCOPHAGE) 1000 MG tablet Take 1 tablet by mouth in the morning and at bedtime. 09/22/21   [provider]  MYRBETRIQ 50 MG TB24 tablet Take 1 tablet by mouth daily. 09/22/21   [provider]  pioglitazone (ACTOS) 30 MG tablet TAKE 1 TABLET(30 MG) BY MOUTH DAILY 10/08/16   Elayne Snare, MD  vitamin B-12 (CYANOCOBALAMIN) 500 MCG tablet Take 2 tablets by mouth daily. 09/22/21   [provider]    Family History Family History  Problem Relation Age of Onset   Hypertension Father    Heart disease Neg Hx    Diabetes  Neg Hx     Social History Social History   Tobacco Use   Smoking status: Every Day   Smokeless tobacco: Never     Allergies   Effexor xr [venlafaxine hcl]   Review of Systems Review of Systems As per HPI  Physical Exam Triage Vital Signs ED Triage Vitals  Enc Vitals Group     BP 07/27/22 1304 123/70     Pulse Rate 07/27/22 1304 83     Resp 07/27/22 1304 18     Temp 07/27/22 1304 97.6 F (36.4 C)     Temp Source 07/27/22 1304 Oral     SpO2 07/27/22 1304 96 %     Weight --      Height --      Head Circumference --      Peak Flow --      Pain Score 07/27/22 1303 0     Pain Loc --      Pain Edu? --      Excl. in [place]? --    No data found.  Updated Vital Signs BP 123/70 (BP Location: Left Arm)   Pulse 83   Temp 97.6 F (36.4 C) (Oral)   Resp 18   SpO2 96%     Physical Exam Vitals and nursing note reviewed.  Constitutional:      General: He is not in acute distress. HENT:     Head: Normocephalic and atraumatic.     Nose: Nose normal.     Mouth/Throat:     Mouth: Mucous membranes are moist.     Pharynx: Oropharynx is clear.  Eyes:     Extraocular Movements: Extraocular movements intact.     Conjunctiva/sclera: Conjunctivae normal.     Pupils: Pupils are equal, round, and reactive to light.  Cardiovascular:     Rate and Rhythm: Normal rate and regular rhythm.     Heart sounds: Normal heart sounds.  Pulmonary:     Effort: Pulmonary effort is normal. No respiratory distress.     Breath sounds: Normal breath sounds.  Musculoskeletal:        General: Normal range of motion.     Cervical back: Normal range of motion.  Neurological:     Mental Status: He is alert and oriented to person, place, and time.     Cranial Nerves: Cranial nerves 2-12 are intact. No cranial nerve deficit or facial asymmetry.     Sensory: Sensation is intact. No sensory deficit.     Motor: Motor function is intact. No weakness or pronator drift.     Coordination: Coordination is  intact. Coordination normal.     Gait: Gait is intact. Gait normal.     Comments: Strength 5/5 throughout. Strong pulses. Sensation intact. Full ROM at each extremity       UC Treatments / Results  Labs (all labs  ordered are listed, but only abnormal results are displayed) Labs Reviewed  CBG MONITORING, ED - Abnormal; Notable for the following components:      Result Value   Glucose-Capillary 58 (*)    All other components within normal limits  CBG MONITORING, ED - Abnormal; Notable for the following components:   Glucose-Capillary 100 (*)    All other components within normal limits    EKG   Radiology No results found.  Procedures Procedures  Medications Ordered in UC Medications - No data to display  Initial Impression / Assessment and Plan / UC Course  I have reviewed the triage vital signs and the nursing notes.  Pertinent labs & imaging results that were available during my care of the patient were reviewed by me and considered in my medical decision making (see chart for details).  CBG on arrival 6. After soda, recheck is 100 He remains asymptomatic in clinic. Neuro exam intact.  Vitals are stable. At this time he is having no symptoms but patient is concerned about another episode occurring.  Shared decision making with wife, patient, provider Patient reports he is afraid to go to sleep wondering if another episode will happen. Otherwise discussed going home and monitoring for any recurrence of symptoms and then seeking care if needed. Wife and patient would like to go to the ED now for further evaluation.  Discharged to ED via POV.  Final Clinical Impressions(s) / UC Diagnoses   Final diagnoses:  Weakness  Stroke-like episode     Discharge Instructions      Please proceed to the ED for further evaluation      ED Prescriptions   None    PDMP not reviewed this encounter.   Les Pou, [place] 07/27/22 1433

## 2022-07-27 NOTE — ED Provider Notes (Signed)
[facility] EMERGENCY DEPT Provider Note   CSN: [id number] Arrival date & time: 07/27/22  1438     History  No chief complaint on file.   Richard [name] is a 76 y.o. male presenting to ED with episodes of weakness, dizziness at home.  The patient was diagnosed with malignant acute otitis in the emergency department, and a CT of the head and temporal bones performed 3 days ago in the ED, and per review of ED provider notes, in consultation with ENT, the patient was given IV antibiotics in ED and started on Augmentin and discharged with ENT follow-up.  He has not yet been able to schedule ENT follow-up.  He returns today with concerns that he has having episodes of weakness overnight.  He describes episodes occurring typically in the middle of the night, where he will notice weakness of either the right half of his body or the left half of his body.  It has been alternating.  His wife notices that he has been "uncoordinated and not like himself" when these episodes occur.  They called EMS out initially and the patient's blood sugars were low, 58, and his symptoms improved with dextrose and sugar.  On subsequent episodes the wife has been giving him juice and peanut butter and his symptoms have been improving at home.    He is a diabetic on glimepiride, metformin, and Actos, takes them regularly.  He does not check his blood sugar regularly.  The patient reports he has not been eating much because he has no appetite for the past several days.  HPI     Home Medications Prior to Admission medications   Medication Sig Start Date End Date Taking? Authorizing Provider  amLODipine (NORVASC) 10 MG tablet Take 1 tablet (10 mg total) by mouth daily. 05/06/21   Elayne Snare, MD  amoxicillin (AMOXIL) 500 MG capsule Take 1 capsule (500 mg total) by mouth 2 (two) times daily for 10 days. 07/23/22 08/02/22  Mar Daring, PA-C  amoxicillin-clavulanate (AUGMENTIN) 875-125 MG tablet Take  1 tablet by mouth every 12 (twelve) hours. 07/25/22   Mesner, Corene Cornea, MD  atorvastatin (LIPITOR) 10 MG tablet TAKE 1 TABLET(10 MG) BY MOUTH DAILY 01/14/17   Elayne Snare, MD  benzonatate (TESSALON) 100 MG capsule Take 1 capsule (100 mg total) by mouth 3 (three) times daily as needed for cough. 07/25/22   Mesner, Corene Cornea, MD  Blood Glucose Monitoring Suppl (ACCU-CHEK GUIDE) w/Device KIT Use to check blood sugars daily 04/10/21   Elayne Snare, MD  ciprofloxacin-dexamethasone (CIPRODEX) OTIC suspension Place 4 drops into the left ear 2 (two) times daily. 07/26/22   Mar Daring, PA-C  glimepiride (AMARYL) 2 MG tablet TAKE 1 TABLET (2 MG TOTAL) BY MOUTH DAILY WITH BREAKFAST. 05/12/18   Elayne Snare, MD  glucose blood (ACCU-CHEK GUIDE) test strip Use to check blood sugars daily 04/10/21   Elayne Snare, MD  hydrALAZINE (APRESOLINE) 10 MG tablet Take 10 mg by mouth daily. 09/22/21   [provider]  Lancets Glory Rosebush ULTRASOFT) lancets Use as instructed to check blood sugar 2 times daily 04/01/21   Elayne Snare, MD  Leuprolide Acetate (LUPRON DEPOT, 40-MONTH, IM) Inject into the muscle.    [provider]  losartan (COZAAR) 100 MG tablet Take 1 tablet by mouth daily. 06/25/21   [provider]  metFORMIN (GLUCOPHAGE) 1000 MG tablet Take 1 tablet by mouth in the morning and at bedtime. 09/22/21   [provider]  MYRBETRIQ 50 MG  TB24 tablet Take 1 tablet by mouth daily. 09/22/21   [provider]  pioglitazone (ACTOS) 30 MG tablet TAKE 1 TABLET(30 MG) BY MOUTH DAILY 10/08/16   Elayne Snare, MD  vitamin B-12 (CYANOCOBALAMIN) 500 MCG tablet Take 2 tablets by mouth daily. 09/22/21   [provider]      Allergies    Effexor xr [venlafaxine hcl]    Review of Systems   Review of Systems  Physical Exam Updated Vital Signs BP 132/66   Pulse 73   Temp 97.6 F (36.4 C)   Resp 20   Ht 5' 10.5" (1.791 m)   Wt 97.5 kg   SpO2 97%   BMI 30.41 kg/m  Physical  Exam Constitutional:      General: He is not in acute distress. HENT:     Head: Normocephalic and atraumatic.     Comments: No mastoid tenderness or discoloration, no mandibular tenderness or discoloration, no crepitus or visible abscess of the face Eyes:     Conjunctiva/sclera: Conjunctivae normal.     Pupils: Pupils are equal, round, and reactive to light.  Cardiovascular:     Rate and Rhythm: Normal rate and regular rhythm.  Pulmonary:     Effort: Pulmonary effort is normal. No respiratory distress.  Skin:    General: Skin is warm and dry.  Neurological:     General: No focal deficit present.     Mental Status: He is alert. Mental status is at baseline.  Psychiatric:        Mood and Affect: Mood normal.        Behavior: Behavior normal.     ED Results / Procedures / Treatments   Labs (all labs ordered are listed, but only abnormal results are displayed) Labs Reviewed  CBC WITH DIFFERENTIAL/PLATELET - Abnormal; Notable for the following components:      Result Value   RBC 3.36 (*)    Hemoglobin 10.8 (*)    HCT 32.4 (*)    Abs Immature Granulocytes 0.15 (*)    All other components within normal limits  COMPREHENSIVE METABOLIC PANEL - Abnormal; Notable for the following components:   Sodium 128 (*)    Chloride 96 (*)    Glucose, Bld 100 (*)    BUN 24 (*)    Creatinine, Ser 1.53 (*)    Calcium 8.5 (*)    Total Protein 6.3 (*)    GFR, Estimated 47 (*)    All other components within normal limits  URINALYSIS, ROUTINE W REFLEX MICROSCOPIC - Abnormal; Notable for the following components:   Color, Urine COLORLESS (*)    Hgb urine dipstick TRACE (*)    Bacteria, UA RARE (*)    All other components within normal limits  CBG MONITORING, ED    EKG EKG Interpretation  Date/Time:  Tuesday July 27 2022 14:56:17 EST Ventricular Rate:  73 PR Interval:  180 QRS Duration: 92 QT Interval:  384 QTC Calculation: 423 R Axis:   -66 Text Interpretation: Normal sinus  rhythm Left anterior fascicular block Septal infarct , age undetermined Abnormal ECG When compared with ECG of 23-Nov-2004 09:19, No significant change was found Confirmed by Octaviano Glow [phone]) on 07/27/2022 5:43:38 PM  Radiology CT Head W or Wo Contrast  Result Date: 07/27/2022 CLINICAL DATA:  Mastoiditis.  Brain abscess. EXAM: CT HEAD WITHOUT AND WITH CONTRAST CT TEMPORAL BONES WITH CONTRAST TECHNIQUE: Contiguous axial images were obtained from the base of the skull through the vertex without and with intravenous  contrast. RADIATION DOSE REDUCTION: This exam was performed according to the departmental dose-optimization program which includes automated exposure control, adjustment of the mA and/or kV according to patient size and/or use of iterative reconstruction technique. CONTRAST:  66m OMNIPAQUE IOHEXOL 300 MG/ML  SOLN COMPARISON:  CT Head 07/24/22, CT Temporal bone 07/25/22 FINDINGS: Brain: No evidence of acute infarction, hemorrhage, hydrocephalus, extra-axial collection or mass lesion/mass effect. Vascular: No hyperdense vessel or unexpected calcification. Visible vessels are patent. Skull: Normal. Negative for fracture or focal lesion. Sinuses/Orbits: No acute finding. Mild mucosal thickening of the bilateral maxillary sinuses. Bilateral lens replacement. Other: None. Temporal bone: Redemonstrated left-sided mastoid effusion without evidence of coalescence. The sigmoid plate is intact. There is no evidence of jugular bulb dehiscence. There is also a left middle ear effusion. The scutum is normal in appearance. The ossicles are intact without evidence of erosion. There is mild asymmetric thinning of the tegmen tympani on the left (series 11, image 109), but the tegmen tympani and tegmen mastoideum are grossly intact. Fluid is also seen within the pneumatized petrous apex air cells on the left (series 7, image 59). Right temporal bone is normal in appearance. IMPRESSION: 1. Redemonstrated  left-sided mastoid and middle ear effusions without evidence of coalescence. Findings could be seen in the setting of otomastoiditis. Fluid is also seen within the petrous apex on the left. The sigmoid plate, tegmen mastoideum, and tegmen tympani are intact. 2. No acute intracranial abnormality. No CT evidence of an intracranial abscess or asymmetric erosive change to definitively suggest osteomyelitis. In the setting of ongoing neurologic symptoms, further evaluation with a contrast enhanced MRI of the brain could be considered for further evaluation. Electronically Signed   By: HMarin RobertsM.D.   On: 07/27/2022 17:07   CT Temporal Bones W Contrast  Result Date: 07/27/2022 CLINICAL DATA:  Mastoiditis.  Brain abscess. EXAM: CT HEAD WITHOUT AND WITH CONTRAST CT TEMPORAL BONES WITH CONTRAST TECHNIQUE: Contiguous axial images were obtained from the base of the skull through the vertex without and with intravenous contrast. RADIATION DOSE REDUCTION: This exam was performed according to the departmental dose-optimization program which includes automated exposure control, adjustment of the mA and/or kV according to patient size and/or use of iterative reconstruction technique. CONTRAST:  716mOMNIPAQUE IOHEXOL 300 MG/ML  SOLN COMPARISON:  CT Head 07/24/22, CT Temporal bone 07/25/22 FINDINGS: Brain: No evidence of acute infarction, hemorrhage, hydrocephalus, extra-axial collection or mass lesion/mass effect. Vascular: No hyperdense vessel or unexpected calcification. Visible vessels are patent. Skull: Normal. Negative for fracture or focal lesion. Sinuses/Orbits: No acute finding. Mild mucosal thickening of the bilateral maxillary sinuses. Bilateral lens replacement. Other: None. Temporal bone: Redemonstrated left-sided mastoid effusion without evidence of coalescence. The sigmoid plate is intact. There is no evidence of jugular bulb dehiscence. There is also a left middle ear effusion. The scutum is normal in  appearance. The ossicles are intact without evidence of erosion. There is mild asymmetric thinning of the tegmen tympani on the left (series 11, image 109), but the tegmen tympani and tegmen mastoideum are grossly intact. Fluid is also seen within the pneumatized petrous apex air cells on the left (series 7, image 59). Right temporal bone is normal in appearance. IMPRESSION: 1. Redemonstrated left-sided mastoid and middle ear effusions without evidence of coalescence. Findings could be seen in the setting of otomastoiditis. Fluid is also seen within the petrous apex on the left. The sigmoid plate, tegmen mastoideum, and tegmen tympani are intact. 2. No acute intracranial abnormality. No  CT evidence of an intracranial abscess or asymmetric erosive change to definitively suggest osteomyelitis. In the setting of ongoing neurologic symptoms, further evaluation with a contrast enhanced MRI of the brain could be considered for further evaluation. Electronically Signed   By: Marin Roberts M.D.   On: 07/27/2022 17:07    Procedures Procedures    Medications Ordered in ED Medications  iohexol (OMNIPAQUE) 300 MG/ML solution 100 mL (75 mLs Intravenous Contrast Given 07/27/22 1620)    ED Course/ Medical Decision Making/ A&P                           Medical Decision Making  This patient presents to the ED with concern for episodes of transient unilateral weakness, hypoglycemia. This involves an extensive number of treatment options, and is a complaint that carries with it a high risk of complications and morbidity.  The differential diagnosis includes symptomatic hypoglycemia versus arrhythmia versus infection versus other  His presentation is most consistent with hypoglycemic episodes, which likely related to poor appetite and not eating much.  We discussed importance of eating regular meals, especially at bedtime, and also monitoring his blood sugars if he were to become symptomatic again.  Co-morbidities  that complicate the patient evaluation: History of diabetes, on multiple diabetic medications, high risk of hypoglycemic episodes.  Additional history obtained from patient's wife at bedside  External records from outside source obtained and reviewed including ED records from 2 to 3 days ago including CT scan of the head and external ear  I ordered and personally interpreted labs.  The pertinent results include: No leukocytosis.  No acute anemia.  No evidence of infection on urine.  CMP shows some downtrending sodium level, creatinine and BUN very mildly elevated.  Otherwise no significant findings.  I ordered imaging studies including CT head, CT temporal bones with contrast I independently visualized and interpreted imaging which showed no worsening of the patient's prior infection, specifically no evidence of mastoid inflammation or tracking infection into the brain I agree with the radiologist interpretation  The patient was maintained on a cardiac monitor.  I personally viewed and interpreted the cardiac monitored which showed an underlying rhythm of: Sinus rhythm  Per my interpretation the patient's ECG shows sinus rhythm no acute ischemic findings  I have reviewed the patients home medicines and have made adjustments as needed.  Specifically we discussed discontinuing his glimepiride at this time and continuing Actos and metformin, keeping a daily diary of his blood sugars, including his morning sugar when he wakes up.  This may be a temporary measure until his appetite improves and he is eating regularly again, and can follow-up with his endocrinologist.  Test Considered: I have a low clinical suspicion for stroke, septic emboli, intracranial abscess at this time.  He is not having evidence of meningismus, infection, and his symptoms occur specifically at night, when his sugars tend to drop, and involve different halves of his body, which is highly inconsistent with stroke or TIA.  I do not  see an indication for emergent MR imaging at this time.  However, as explained to him and his wife, if his symptoms were to persist or not improved with juice, sugar, he needs to call 911 and come back to the ER, may require further work-up or MR imaging at that time.  After the interventions noted above, I reevaluated the patient and found that they have: stayed the same   Dispostion:  After  consideration of the diagnostic results and the patients response to treatment, I feel that the patent would benefit from outpatient follow-up         Final Clinical Impression(s) / ED Diagnoses Final diagnoses:  None    Rx / DC Orders ED Discharge Orders     None         Joetta Delprado, Carola Rhine, MD 07/27/22 1815

## 2022-07-27 NOTE — ED Triage Notes (Signed)
Pt arrived POV, caox4, and ambulatory.   Pt states "For the past 3 nights in the middle of the night he is experiencing stroke like s/s which have resolved within an hour or so."   Pt was evaluated at [facility] Sat night after experiencing R sided weakness.   Pt's wife states last night pt woke up at approx 3am and pt was unable to walk and incoherent. EMS came and had CBG in 50's so he ate and s/s subsided.   Pt seen at [facility] today for follow up with concerns of this happening 3 nights in a row.  NIH 0 in triage with no complaints of pain/weakness.

## 2022-07-27 NOTE — ED Notes (Signed)
Patient is being discharged from the Urgent Care and sent to the Emergency Department via POC . Per Rebecca,PA, patient is in need of higher level of care due to further evaluation. Patient is aware and verbalizes understanding of plan of care.  Vitals:   07/27/22 1304  BP: 123/70  Pulse: 83  Resp: 18  Temp: 97.6 F (36.4 C)  SpO2: 96%

## 2022-07-28 ENCOUNTER — Telehealth: Payer: Self-pay | Admitting: Endocrinology

## 2022-07-28 ENCOUNTER — Other Ambulatory Visit: Payer: Self-pay | Admitting: Endocrinology

## 2022-07-28 DIAGNOSIS — E871 Hypo-osmolality and hyponatremia: Secondary | ICD-10-CM

## 2022-07-28 NOTE — Telephone Encounter (Signed)
Patient's wife, Richard [name], is calling to say that patient started having symptoms of stroke on Saturday night 07/24/2022.  Richard [name] states that they called 911 and blood sugar level was 60.  Richard [name] is not sure if EMT administered any medications at that time.  Patient was taken to ED and stayed there until Sunday morning at about 6:00 AM.  Ed physician did CT scan because of stroke symptoms.  Sunday night he had another similar episode- patient could not talk and was incoherent.  After about 15 minutes the symptoms passed and his condition passed.  Blood sugar level was not taken at any time.  Richard [name] states that all episodes seem to occur after 4-5 hours of sleep. On Tuesday morning at about 4:00 AM Richard [name] had another very bad episode, so Richard [name] called 911.  Richard [name] states that patient could not walk, talk, and was incoherent again.  At that time his blood sugar level was 52.  Patient was not taken to ED.  Richard [name] took Richard [name] to [facility] on [address].  Blood sugar level at the Urgent Care was 52 again.  The Urgent Care recommended that he be taken to [facility] ED.  As of yesterday, the ED recommended that Richard [name] discontinue  glimepiride glimepiride (AMARYL) 2 MG tablet.  The ED recommended that [facility] contact Dr. Dwyane Dee to discuss changing his medication for diabetes.  **Per Richard [name] patient did telehealth visit on Friday 07/23/2022 for ear infection.  Richard [name] was started on  amoxicillin amoxicillin (AMOXIL) 500 MG capsule.  **ED on Saturday 07/24/2022 changed his antibiotic to amoxicillin-clavulanate amoxicillin-clavulanate (AUGMENTIN) 875-125 MG tablet saying that the infection had gone to the mastoid.  Also, ED doctor recommended that Richard [name] be woken up every 2 hours and be given something to eat.  Patient has had no more symptoms since yesterday 07/27/2022.

## 2022-08-17 ENCOUNTER — Other Ambulatory Visit (INDEPENDENT_AMBULATORY_CARE_PROVIDER_SITE_OTHER): Payer: Medicare PPO

## 2022-08-17 DIAGNOSIS — E119 Type 2 diabetes mellitus without complications: Secondary | ICD-10-CM

## 2022-08-17 DIAGNOSIS — E7800 Pure hypercholesterolemia, unspecified: Secondary | ICD-10-CM | POA: Diagnosis not present

## 2022-08-17 DIAGNOSIS — E871 Hypo-osmolality and hyponatremia: Secondary | ICD-10-CM | POA: Diagnosis not present

## 2022-08-17 LAB — BASIC METABOLIC PANEL
BUN: 23 mg/dL (ref 6–23)
CO2: 27 mEq/L (ref 19–32)
Calcium: 9.5 mg/dL (ref 8.4–10.5)
Chloride: 100 mEq/L (ref 96–112)
Creatinine, Ser: 1.33 mg/dL (ref 0.40–1.50)
GFR: 51.53 mL/min — ABNORMAL LOW (ref >60.00)
Glucose, Bld: 153 mg/dL — ABNORMAL HIGH (ref 70–99)
Potassium: 4.2 mEq/L (ref 3.5–5.1)
Sodium: 135 mEq/L (ref 135–145)

## 2022-08-17 LAB — MICROALBUMIN / CREATININE URINE RATIO
Creatinine,U: 222.4 mg/dL
Microalb Creat Ratio: 12 mg/g (ref 0.0–30.0)
Microalb, Ur: 26.7 mg/dL — ABNORMAL HIGH (ref 0.0–1.9)

## 2022-08-17 LAB — LIPID PANEL
Cholesterol: 171 mg/dL (ref 0–200)
HDL: 76.7 mg/dL (ref >39.00)
LDL Cholesterol: 68 mg/dL (ref 0–99)
NonHDL: 94.23
Total CHOL/HDL Ratio: 2
Triglycerides: 129 mg/dL (ref 0.0–149.0)
VLDL: 25.8 mg/dL (ref 0.0–40.0)

## 2022-08-17 LAB — TSH: TSH: 0.89 u[IU]/mL (ref 0.35–5.50)

## 2022-08-17 LAB — HEMOGLOBIN A1C: Hgb A1c MFr Bld: 6.1 % (ref 4.6–6.5)

## 2022-08-20 ENCOUNTER — Ambulatory Visit: Payer: Medicare PPO | Admitting: Endocrinology

## 2022-08-20 ENCOUNTER — Encounter: Payer: Self-pay | Admitting: Endocrinology

## 2022-08-20 VITALS — BP 132/74 | HR 68 | Ht 70.5 in | Wt 207.0 lb

## 2022-08-20 DIAGNOSIS — E1165 Type 2 diabetes mellitus with hyperglycemia: Secondary | ICD-10-CM | POA: Diagnosis not present

## 2022-08-20 DIAGNOSIS — E7800 Pure hypercholesterolemia, unspecified: Secondary | ICD-10-CM | POA: Diagnosis not present

## 2022-08-20 DIAGNOSIS — I10 Essential (primary) hypertension: Secondary | ICD-10-CM

## 2022-08-20 DIAGNOSIS — N289 Disorder of kidney and ureter, unspecified: Secondary | ICD-10-CM | POA: Diagnosis not present

## 2022-08-20 NOTE — Patient Instructions (Signed)
Check blood sugars on waking up 3 days a week  Also check blood sugars about 2 hours after meals and do this after different meals by rotation  Recommended blood sugar levels on waking up are 90-130 and about 2 hours after meal is 130-160  Please bring your blood sugar monitor to each visit, thank you  Stop Glimeperide

## 2022-08-20 NOTE — Progress Notes (Addendum)
Patient ID: Richard [name], male   DOB: 09/20/1945, 76 y.o.   MRN: [id number]    Reason for Appointment:  Follow-up visit  History of Present Illness   Problem 1:   DIABETES MELITUS, date of diagnosis 1995 with a glucose of 320 and HemoglobinA1c of 9.6 Previous history: He has been on various oral hypoglycemic drugs in the past but for the last few years has required multiple drugs in combination; diabetes has been generally well controlled with A1c near normal  Recent history:   Oral hypoglycemic drugs:  metformin er 1000 twice a day, Actos 30 mg daily  His A1c is the same at 6.1 %   Current management, blood sugar patterns and problems identified: He was seen early because of recent episode of severe hypoglycemia  Following a respiratory infection he had decreased intake, likely some dehydration His blood sugars subsequently dropped in the 50s and he was having difficulty with speech and unilateral weakness in the last week of November He was told finally to stop his Amaryl and follow-up. Although he has not checked his blood sugars in the last 12 days subsequently his blood sugars late evenings were not low  He is still on metformin and Actos; his PCP has changed him to extended release metformin because of tendency to diarrhea with regular metformin Glucose in the lab was 153 after breakfast He has started to feel better and is going back to his walking although not as much as usual As expected his weight has come down with recent illness   Side effects from medications: None       Monitors blood glucose:  irregularly     Glucometer: Freestyle lite  His monitor has incorrect time programmed  Home blood Glucose from download:  RECENT blood sugars late evening are ranging from 102 up to 190 and midday 162   PREVIOUS average 118 with only about 5 recent readings  Dietician visit: Most recent:? 2007    Weight control:      Wt Readings from Last 3 Encounters:   08/20/22 207 lb (93.9 kg)  07/27/22 215 lb (97.5 kg)  06/17/22 216 lb 14.4 oz (98.4 kg)    Diabetes labs:  Lab Results  Component Value Date   HGBA1C 6.1 08/17/2022   HGBA1C 6.0 04/27/2022   HGBA1C 6.0 09/28/2021   Lab Results  Component Value Date   MICROALBUR 26.7 (H) 08/17/2022   LDLCALC 68 08/17/2022   CREATININE 1.33 08/17/2022    Other problems addressed today: See review of systems   Lab on 08/17/2022  Component Date Value Ref Range Status   TSH 08/17/2022 0.89  0.35 - 5.50 uIU/mL Final   Cholesterol 08/17/2022 171  0 - 200 mg/dL Final   ATP III Classification       Desirable:  < 200 mg/dL               Borderline High:  200 - 239 mg/dL          High:  > = 240 mg/dL   Triglycerides 08/17/2022 129.0  0.0 - 149.0 mg/dL Final   Normal:  <150 mg/dLBorderline High:  150 - 199 mg/dL   HDL 08/17/2022 76.70  >39.00 mg/dL Final   VLDL 08/17/2022 25.8  0.0 - 40.0 mg/dL Final   LDL Cholesterol 08/17/2022 68  0 - 99 mg/dL Final   Total CHOL/HDL Ratio 08/17/2022 2   Final  Men          Women1/2 Average Risk     3.4          3.3Average Risk          5.0          4.42X Average Risk          9.6          7.13X Average Risk          15.0          11.0                       NonHDL 08/17/2022 94.23   Final   NOTE:  Non-HDL goal should be 30 mg/dL higher than patient's LDL goal (i.e. LDL goal of < 70 mg/dL, would have non-HDL goal of < 100 mg/dL)   Microalb, Ur 08/17/2022 26.7 (H)  0.0 - 1.9 mg/dL Final   Creatinine,U 08/17/2022 222.4  mg/dL Final   Microalb Creat Ratio 08/17/2022 12.0  0.0 - 30.0 mg/g Final   Sodium 08/17/2022 135  135 - 145 mEq/L Final   Potassium 08/17/2022 4.2  3.5 - 5.1 mEq/L Final   Chloride 08/17/2022 100  96 - 112 mEq/L Final   CO2 08/17/2022 27  19 - 32 mEq/L Final   Glucose, Bld 08/17/2022 153 (H)  70 - 99 mg/dL Final   BUN 08/17/2022 23  6 - 23 mg/dL Final   Creatinine, Ser 08/17/2022 1.33  0.40 - 1.50 mg/dL Final   GFR 08/17/2022 51.53  (L)  >60.00 mL/min Final   Calculated using the CKD-EPI Creatinine Equation (2021)   Calcium 08/17/2022 9.5  8.4 - 10.5 mg/dL Final   Hgb A1c MFr Bld 08/17/2022 6.1  4.6 - 6.5 % Final   Glycemic Control Guidelines for People with Diabetes:Non Diabetic:  <6%Goal of Therapy: <7%Additional Action Suggested:  >8%      Allergies as of 08/20/2022       Reactions   Effexor Xr [venlafaxine Hcl] Rash        Medication List        Accurate as of August 20, 2022 11:55 AM. If you have any questions, ask your nurse or doctor.          Accu-Chek Guide test strip Generic drug: glucose blood Use to check blood sugars daily   Accu-Chek Guide w/Device Kit Use to check blood sugars daily   amLODipine 10 MG tablet Commonly known as: NORVASC Take 1 tablet (10 mg total) by mouth daily.   amoxicillin-clavulanate 875-125 MG tablet Commonly known as: AUGMENTIN Take 1 tablet by mouth every 12 (twelve) hours.   atorvastatin 10 MG tablet Commonly known as: LIPITOR TAKE 1 TABLET(10 MG) BY MOUTH DAILY   benzonatate 100 MG capsule Commonly known as: TESSALON Take 1 capsule (100 mg total) by mouth 3 (three) times daily as needed for cough.   ciprofloxacin-dexamethasone OTIC suspension Commonly known as: Ciprodex Place 4 drops into the left ear 2 (two) times daily.   cyanocobalamin 500 MCG tablet Commonly known as: VITAMIN B12 Take 2 tablets by mouth daily.   dextrose 40 % Gel Commonly known as: GLUTOSE TAKE 15GM (1 TUBE) BY MOUTH  AS NEEDED FOR LOW GLUCOSE (BLOOD SUGAR BELOW 70)   glimepiride 2 MG tablet Commonly known as: AMARYL TAKE 1 TABLET (2 MG TOTAL) BY MOUTH DAILY WITH BREAKFAST.   hydrALAZINE 10 MG tablet Commonly known as: APRESOLINE Take 10 mg by  mouth daily.   hydrALAZINE 25 MG tablet Commonly known as: APRESOLINE TAKE ONE TABLET BY MOUTH TWICE A DAY FOR BLOOD PRESSURE   losartan 100 MG tablet Commonly known as: COZAAR Take 1 tablet by mouth daily.   LUPRON  DEPOT (88-MONTH) IM Inject into the muscle.   metFORMIN 1000 MG tablet Commonly known as: GLUCOPHAGE Take 1 tablet by mouth in the morning and at bedtime.   metFORMIN 500 MG 24 hr tablet Commonly known as: GLUCOPHAGE-XR TAKE TWO TABLETS BY MOUTH TWICE A DAY FOR DIABETES (ANNUAL KIDNEY FUNCTION TESTING IS NEEDED)   Myrbetriq 50 MG Tb24 tablet Generic drug: mirabegron ER Take 1 tablet by mouth daily.   onetouch ultrasoft lancets Use as instructed to check blood sugar 2 times daily   pioglitazone 30 MG tablet Commonly known as: ACTOS TAKE 1 TABLET(30 MG) BY MOUTH DAILY        Allergies:  Allergies  Allergen Reactions   Effexor Xr [Venlafaxine Hcl] Rash    Past Medical History:  Diagnosis Date   Diabetes mellitus without complication ([place])    Hypertension    prostate ca dx'd 2006   prostatectomy and xrt    No past surgical history on file.  Family History  Problem Relation Age of Onset   Hypertension Father    Heart disease Neg Hx    Diabetes Neg Hx     Social History:  reports that he has been smoking. He has never used smokeless tobacco. No history on file for alcohol use and drug use.    REVIEW OF SYSTEMS:       Hypertension: Was on  losartan previously and this was stopped recently completely He is now on 25 mg hydralazine 3 times daily from his PCP No side effects with this Also on amlodipine 10 mg  He is not monitoring blood pressure at home  BP Readings from Last 3 Encounters:  08/20/22 (!) 156/80  07/27/22 135/69  07/27/22 123/70    Has relatively high creatinine levels for some time Creatinine is appearing improved since his PCP has stopped his losartan, previously also had some tendency to hyperkalemia   Lab Results  Component Value Date   CREATININE 1.33 08/17/2022   CREATININE 1.53 (H) 07/27/2022   CREATININE 1.52 (H) 07/24/2022   Lab Results  Component Value Date   K 4.2 08/17/2022     Lipids:  he has been treated with Lipitor  10 mg LDL is consistently around 70 with consistent regimen of medication   Lab Results  Component Value Date   CHOL 171 08/17/2022   CHOL 162 09/28/2021   CHOL 160 04/27/2021   Lab Results  Component Value Date   HDL 76.70 08/17/2022   HDL 73.80 09/28/2021   HDL 59.90 04/27/2021   Lab Results  Component Value Date   LDLCALC 68 08/17/2022   LDLCALC 70 09/28/2021   LDLCALC 72 04/27/2021   Lab Results  Component Value Date   TRIG 129.0 08/17/2022   TRIG 89.0 09/28/2021   TRIG 142.0 04/27/2021   Lab Results  Component Value Date   CHOLHDL 2 08/17/2022   CHOLHDL 2 09/28/2021   CHOLHDL 3 04/27/2021   Lab Results  Component Value Date   LDLDIRECT 84.0 03/30/2021      History of vitamin B12 deficiency on supplement Has mild chronic anemia His last B12 level is normal  CBC followed by oncologist    Last eye exam was in 2023 with the [place], report again not available  Last foot exam 8/22  LABS:  Lab on 08/17/2022  Component Date Value Ref Range Status   TSH 08/17/2022 0.89  0.35 - 5.50 uIU/mL Final   Cholesterol 08/17/2022 171  0 - 200 mg/dL Final   ATP III Classification       Desirable:  < 200 mg/dL               Borderline High:  200 - 239 mg/dL          High:  > = 240 mg/dL   Triglycerides 08/17/2022 129.0  0.0 - 149.0 mg/dL Final   Normal:  <150 mg/dLBorderline High:  150 - 199 mg/dL   HDL 08/17/2022 76.70  >39.00 mg/dL Final   VLDL 08/17/2022 25.8  0.0 - 40.0 mg/dL Final   LDL Cholesterol 08/17/2022 68  0 - 99 mg/dL Final   Total CHOL/HDL Ratio 08/17/2022 2   Final                  Men          Women1/2 Average Risk     3.4          3.3Average Risk          5.0          4.42X Average Risk          9.6          7.13X Average Risk          15.0          11.0                       NonHDL 08/17/2022 94.23   Final   NOTE:  Non-HDL goal should be 30 mg/dL higher than patient's LDL goal (i.e. LDL goal of < 70 mg/dL, would have non-HDL goal of < 100 mg/dL)    Microalb, Ur 08/17/2022 26.7 (H)  0.0 - 1.9 mg/dL Final   Creatinine,U 08/17/2022 222.4  mg/dL Final   Microalb Creat Ratio 08/17/2022 12.0  0.0 - 30.0 mg/g Final   Sodium 08/17/2022 135  135 - 145 mEq/L Final   Potassium 08/17/2022 4.2  3.5 - 5.1 mEq/L Final   Chloride 08/17/2022 100  96 - 112 mEq/L Final   CO2 08/17/2022 27  19 - 32 mEq/L Final   Glucose, Bld 08/17/2022 153 (H)  70 - 99 mg/dL Final   BUN 08/17/2022 23  6 - 23 mg/dL Final   Creatinine, Ser 08/17/2022 1.33  0.40 - 1.50 mg/dL Final   GFR 08/17/2022 51.53 (L)  >60.00 mL/min Final   Calculated using the CKD-EPI Creatinine Equation (2021)   Calcium 08/17/2022 9.5  8.4 - 10.5 mg/dL Final   Hgb A1c MFr Bld 08/17/2022 6.1  4.6 - 6.5 % Final   Glycemic Control Guidelines for People with Diabetes:Non Diabetic:  <6%Goal of Therapy: <7%Additional Action Suggested:  >8%      Examination:   BP (!) 156/80   Pulse 68   Ht 5' 10.5" (1.791 m)   Wt 207 lb (93.9 kg)   SpO2 97%   BMI 29.28 kg/m   Body mass index is 29.28 kg/m.   He is quite alert and communicative normally No pedal edema  ASSESSMENT/ PLAN:    Diabetes type 2 with mild obesity:    He is on Actos and metformin   A1c is again around the same at 6.1  Amaryl was stopped because of recent hypoglycemia Now  on metformin ER, apparently was having more diarrhea with regular metformin He has lost some weight and with recent illness has somewhat less intake However his blood sugars are not significantly high without taking Amaryl  He will continue the same regimen To call if he has any hypoglycemia  Hypertension: Blood pressure is fairly well controlled with hydralazine and amlodipine only now   Since he does not have microalbuminuria he can leave off his losartan as previously his creatinine was relatively high and has tendency to hyperkalemia with this also  CKD: With stopping losartan his creatinine is improved and GFR is over 50  Reminded him to regularly  be monitoring blood pressure at home   LIPIDS: LDL well-controlled on atorvastatin, triglycerides normal also  All questions regarding medications and recent labs were answered in detail with his wife present in the office today  Elayne Snare 08/20/2022, 11:55 AM     Note: This office note was prepared with Dragon voice recognition system technology. Any transcriptional errors that result from this process are unintentional.

## 2022-10-14 ENCOUNTER — Other Ambulatory Visit: Payer: Medicare PPO

## 2022-10-21 ENCOUNTER — Other Ambulatory Visit: Payer: Self-pay

## 2022-10-21 ENCOUNTER — Inpatient Hospital Stay: Payer: Medicare PPO | Attending: Hematology and Oncology

## 2022-10-21 ENCOUNTER — Ambulatory Visit: Payer: Medicare PPO

## 2022-10-21 ENCOUNTER — Ambulatory Visit: Payer: Medicare PPO | Admitting: Oncology

## 2022-10-21 DIAGNOSIS — Z5111 Encounter for antineoplastic chemotherapy: Secondary | ICD-10-CM | POA: Diagnosis not present

## 2022-10-21 DIAGNOSIS — C61 Malignant neoplasm of prostate: Secondary | ICD-10-CM

## 2022-10-21 LAB — CBC WITH DIFFERENTIAL (CANCER CENTER ONLY)
Abs Immature Granulocytes: 0.01 10*3/uL (ref 0.00–0.07)
Basophils Absolute: 0 10*3/uL (ref 0.0–0.1)
Basophils Relative: 1 %
Eosinophils Absolute: 0.1 10*3/uL (ref 0.0–0.5)
Eosinophils Relative: 2 %
HCT: 38.5 % — ABNORMAL LOW (ref 39.0–52.0)
Hemoglobin: 12.7 g/dL — ABNORMAL LOW (ref 13.0–17.0)
Immature Granulocytes: 0 %
Lymphocytes Relative: 33 %
Lymphs Abs: 2.2 10*3/uL (ref 0.7–4.0)
MCH: 32.5 pg (ref 26.0–34.0)
MCHC: 33 g/dL (ref 30.0–36.0)
MCV: 98.5 fL (ref 80.0–100.0)
Monocytes Absolute: 0.8 10*3/uL (ref 0.1–1.0)
Monocytes Relative: 12 %
Neutro Abs: 3.4 10*3/uL (ref 1.7–7.7)
Neutrophils Relative %: 52 %
Platelet Count: 228 10*3/uL (ref 150–400)
RBC: 3.91 MIL/uL — ABNORMAL LOW (ref 4.22–5.81)
RDW: 13.2 % (ref 11.5–15.5)
WBC Count: 6.6 10*3/uL (ref 4.0–10.5)
nRBC: 0 % (ref 0.0–0.2)

## 2022-10-21 LAB — CMP (CANCER CENTER ONLY)
ALT: 11 U/L (ref 0–44)
AST: 15 U/L (ref 15–41)
Albumin: 4 g/dL (ref 3.5–5.0)
Alkaline Phosphatase: 60 U/L (ref 38–126)
Anion gap: 7 (ref 5–15)
BUN: 40 mg/dL — ABNORMAL HIGH (ref 8–23)
CO2: 28 mmol/L (ref 22–32)
Calcium: 9.3 mg/dL (ref 8.9–10.3)
Chloride: 104 mmol/L (ref 98–111)
Creatinine: 1.44 mg/dL — ABNORMAL HIGH (ref 0.61–1.24)
GFR, Estimated: 50 mL/min — ABNORMAL LOW (ref >60)
Glucose, Bld: 147 mg/dL — ABNORMAL HIGH (ref 70–99)
Potassium: 5.1 mmol/L (ref 3.5–5.1)
Sodium: 139 mmol/L (ref 135–145)
Total Bilirubin: 0.4 mg/dL (ref 0.3–1.2)
Total Protein: 6.5 g/dL (ref 6.5–8.1)

## 2022-10-23 LAB — PROSTATE-SPECIFIC AG, SERUM (LABCORP): Prostate Specific Ag, Serum: <0.1 ng/mL (ref 0.0–4.0)

## 2022-10-25 ENCOUNTER — Other Ambulatory Visit (INDEPENDENT_AMBULATORY_CARE_PROVIDER_SITE_OTHER): Payer: Medicare PPO

## 2022-10-25 DIAGNOSIS — E1165 Type 2 diabetes mellitus with hyperglycemia: Secondary | ICD-10-CM | POA: Diagnosis not present

## 2022-10-25 LAB — BASIC METABOLIC PANEL
BUN: 31 mg/dL — ABNORMAL HIGH (ref 6–23)
CO2: 30 mEq/L (ref 19–32)
Calcium: 9.6 mg/dL (ref 8.4–10.5)
Chloride: 103 mEq/L (ref 96–112)
Creatinine, Ser: 1.37 mg/dL (ref 0.40–1.50)
GFR: 49.67 mL/min — ABNORMAL LOW (ref >60.00)
Glucose, Bld: 115 mg/dL — ABNORMAL HIGH (ref 70–99)
Potassium: 5.3 mEq/L — ABNORMAL HIGH (ref 3.5–5.1)
Sodium: 139 mEq/L (ref 135–145)

## 2022-10-25 LAB — HEMOGLOBIN A1C: Hgb A1c MFr Bld: 6.6 % — ABNORMAL HIGH (ref 4.6–6.5)

## 2022-10-28 ENCOUNTER — Encounter: Payer: Self-pay | Admitting: Endocrinology

## 2022-10-28 ENCOUNTER — Inpatient Hospital Stay: Payer: Medicare PPO

## 2022-10-28 ENCOUNTER — Ambulatory Visit: Payer: Medicare PPO | Admitting: Endocrinology

## 2022-10-28 ENCOUNTER — Other Ambulatory Visit: Payer: Medicare PPO

## 2022-10-28 ENCOUNTER — Ambulatory Visit: Payer: Medicare PPO | Admitting: Oncology

## 2022-10-28 ENCOUNTER — Inpatient Hospital Stay: Payer: Medicare PPO | Admitting: Hematology and Oncology

## 2022-10-28 ENCOUNTER — Ambulatory Visit: Payer: Medicare PPO

## 2022-10-28 VITALS — BP 138/65 | HR 76 | Resp 18 | Wt 210.2 lb

## 2022-10-28 VITALS — BP 122/80 | HR 70 | Ht 70.5 in | Wt 209.0 lb

## 2022-10-28 DIAGNOSIS — Z8546 Personal history of malignant neoplasm of prostate: Secondary | ICD-10-CM

## 2022-10-28 DIAGNOSIS — C61 Malignant neoplasm of prostate: Secondary | ICD-10-CM | POA: Diagnosis not present

## 2022-10-28 DIAGNOSIS — I10 Essential (primary) hypertension: Secondary | ICD-10-CM | POA: Diagnosis not present

## 2022-10-28 DIAGNOSIS — E1165 Type 2 diabetes mellitus with hyperglycemia: Secondary | ICD-10-CM | POA: Diagnosis not present

## 2022-10-28 DIAGNOSIS — E875 Hyperkalemia: Secondary | ICD-10-CM | POA: Diagnosis not present

## 2022-10-28 DIAGNOSIS — Z5111 Encounter for antineoplastic chemotherapy: Secondary | ICD-10-CM | POA: Diagnosis not present

## 2022-10-28 MED ORDER — LEUPROLIDE ACETATE (4 MONTH) 30 MG IM KIT
30.0000 mg | PACK | Freq: Once | INTRAMUSCULAR | Status: DC
  Filled 2022-10-28: qty 30

## 2022-10-28 MED ORDER — EMPAGLIFLOZIN 10 MG PO TABS: 10.0000 mg | ORAL_TABLET | Freq: Every day | ORAL | 3 refills | Status: DC

## 2022-10-28 MED ORDER — LEUPROLIDE ACETATE (4 MONTH) 30 MG ~~LOC~~ KIT
30.0000 mg | PACK | Freq: Once | SUBCUTANEOUS | Status: AC
  Administered 2022-10-28: 30 mg via SUBCUTANEOUS
  Filled 2022-10-28: qty 30

## 2022-10-28 NOTE — Patient Instructions (Signed)
Leuprolide Solution for Injection What is this medication? LEUPROLIDE (loo PROE lide) reduces the symptoms of prostate cancer. It works by decreasing levels of the hormone testosterone in the body. This prevents prostate cancer cells from spreading or growing. This medicine may be used for other purposes; ask your health care provider or pharmacist if you have questions. COMMON BRAND NAME(S): Lupron What should I tell my care team before I take this medication? They need to know if you have any of these conditions: Diabetes Heart attack Heart disease High blood pressure High cholesterol Pain or difficulty passing urine Spinal cord metastasis Stroke Tobacco use An unusual or allergic reaction to leuprolide, other medications, foods, dyes, or preservatives Pregnant or trying to get pregnant Breast-feeding How should I use this medication? This medication is for injection under the skin or into a muscle. You will be taught how to prepare and give this medication. Use exactly as directed. Take your medication at regular intervals. Do not take it more often than directed. It is important that you put your used needles and syringes in a special sharps container. Do not put them in a trash can. If you do not have a sharps container, call your care team to get one. A special MedGuide will be given to you by the pharmacist with each prescription and refill. Be sure to read this information carefully each time. Talk to your care team about the use of this medication in children. While this medication may be prescribed for children as young as 8 years for selected conditions, precautions do apply. Overdosage: If you think you have taken too much of this medicine contact a poison control center or emergency room at once. NOTE: This medicine is only for you. Do not share this medicine with others. What if I miss a dose? If you miss a dose, take it as soon as you can. If it is almost time for your next  dose, take only that dose. Do not take double or extra doses. What may interact with this medication? Do not take this medication with any of the following: Chasteberry Cisapride Dronedarone Pimozide Thioridazine This medication may also interact with the following: Estrogen or progestin hormones Herbal or dietary supplements, like black cohosh or DHEA Other medications that cause heart rhythm changes Testosterone This list may not describe all possible interactions. Give your health care provider a list of all the medicines, herbs, non-prescription drugs, or dietary supplements you use. Also tell them if you smoke, drink alcohol, or use illegal drugs. Some items may interact with your medicine. What should I watch for while using this medication? Visit your care team for regular checks on your progress. During the first week, your symptoms may get worse, but then will improve as you continue your treatment. You may get hot flashes, increased bone pain, increased difficulty passing urine, or an aggravation of nerve symptoms. Discuss these effects with your care team, some of them may improve with continued use of this medication. Patients may experience a menstrual cycle or spotting during the first 2 months of therapy with this medication. If this continues, contact your care team. This medication may increase blood sugar. The risk may be higher in patients who already have diabetes. Ask your care team what you can do to lower your risk of diabetes while taking this medication. What side effects may I notice from receiving this medication? Side effects that you should report to your care team as soon as possible: Allergic reactions--skin rash,  itching, hives, swelling of the face, lips, tongue, or throat Heart attack--pain or tightness in the chest, shoulders, arms, or jaw, nausea, shortness of breath, cold or clammy skin, feeling faint or lightheaded Heart rhythm changes--fast or irregular  heartbeat, dizziness, feeling faint or lightheaded, chest pain, trouble breathing High blood sugar (hyperglycemia)--increased thirst or amount of urine, unusual weakness or fatigue, blurry vision Mood swings, irritability, hostility Seizures Stroke--sudden numbness or weakness of the face, arm, or leg, trouble speaking, confusion, trouble walking, loss of balance or coordination, dizziness, severe headache, change in vision Thoughts of suicide or self-harm, worsening mood, feelings of depression Side effects that usually do not require medical attention (report to your care team if they continue or are bothersome): Bone pain Change in sex drive or performance General discomfort and fatigue Hot flashes Muscle pain Pain, redness, or irritation at injection site Swelling of the ankles, hands, or feet This list may not describe all possible side effects. Call your doctor for medical advice about side effects. You may report side effects to FDA at 1-800-FDA-1088. Where should I keep my medication? Keep out of the reach of children and pets. Store below 25 degrees C (77 degrees F). Do not freeze. Protect from light. Get rid of any unused medication after the expiration date. To get rid of medications that are no longer needed or have expired: Take the medication to a medication take-back program. Check with your pharmacy or law enforcement to find a location. If you cannot return the medication, ask your pharmacist or care team how to get rid of this medication safely. NOTE: This sheet is a summary. It may not cover all possible information. If you have questions about this medicine, talk to your doctor, pharmacist, or health care provider.  2023 Elsevier/Gold Standard (2021-10-01 00:00:00)

## 2022-10-28 NOTE — Patient Instructions (Signed)
Check blood sugars on waking up 2-3 days a week  Also check blood sugars about 2 hours after meals and do this after different meals by rotation  Recommended blood sugar levels on waking up are 90-130 and about 2 hours after meal is 130-160  Please bring your blood sugar monitor to each visit, thank you  Amlodipine 1/2 daily  More fluids, check BP

## 2022-10-28 NOTE — Progress Notes (Signed)
[place] Telephone:([phone]   [phone]  PROGRESS NOTE  Patient Care Team: Charlsie Merles, MD as PCP - General (Internal Medicine)  Hematological/Oncological History # Advanced Castrate Sensitive Prostate Cancer  11/25/2004 : diagnosed with Prostate cancer, underwent retropubic prostatectomy and bilateral lymph node dissection. The pathology showed a Gleason score of 4+4 equals 8 with staging of T2b N0.  3-12/2005: salvage radiation therapy for a rise in his PSA to 0.29. He received total of 65 gray in 35 fractions.  2016: castration-sensitive with biochemical relapse. Gleason score 4+3 equal 7 and a PSA of 3.66 at the time  06/17/2022: last visit with Dr. Alen Blew.  10/28/2022: transfer care to Dr. Lorenso Courier   Interval History:  Richard [name] 77 y.o. male with medical history significant for castrate sensitive prostate cancer who presents for a follow up visit. The patient's last visit was on 06/17/2022. In the interim since the last visit he has continued on lupron therapy.   On exam today [name] ports he is tolerating his Eligard shots well without any difficulty.  He reports that he would like to continue receiving the shots every 4 weeks.  He notes he is never had any major side effects other than some occasional hot flashes, typically worse in the summertime.  He notes that he does have some occasional forgetfulness and muscle loss.  He also endorses some occasional bouts of fatigue but overall he feels well.  He notes he is able to complete his day-to-day task without difficulty.  He denies any fevers, chills, sweats, nausea, vomiting or diarrhea.  He is has no new bone or back pain.  A full 10 point ROS was otherwise negative.  MEDICAL HISTORY:  Past Medical History:  Diagnosis Date   Diabetes mellitus without complication ([place])    Hypertension    prostate ca dx'd 2006   prostatectomy and xrt    SURGICAL HISTORY: No past surgical history on  file.  SOCIAL HISTORY: Social History   Socioeconomic History   Marital status: Married    Spouse name: Not on file   Number of children: Not on file   Years of education: Not on file   Highest education level: Not on file  Occupational History   Not on file  Tobacco Use   Smoking status: Every Day   Smokeless tobacco: Never  Substance and Sexual Activity   Alcohol use: Not on file   Drug use: Not on file   Sexual activity: Not on file  Other Topics Concern   Not on file  Social History Narrative   Not on file   Social Determinants of Health   Financial Resource Strain: Not on file  Food Insecurity: Not on file  Transportation Needs: Not on file  Physical Activity: Not on file  Stress: Not on file  Social Connections: Not on file  Intimate Partner Violence: Not on file    FAMILY HISTORY: Family History  Problem Relation Age of Onset   Hypertension Father    Heart disease Neg Hx    Diabetes Neg Hx     ALLERGIES:  is allergic to effexor xr [venlafaxine hcl].  MEDICATIONS:  Current Outpatient Medications  Medication Sig Dispense Refill   amLODipine (NORVASC) 10 MG tablet Take 1 tablet (10 mg total) by mouth daily. (Patient taking differently: Take 10 mg by mouth daily. Take 1/2 tablet daily) 90 tablet 2   atorvastatin (LIPITOR) 10 MG tablet TAKE 1 TABLET(10 MG) BY MOUTH DAILY  90 tablet 0   Blood Glucose Monitoring Suppl (ACCU-CHEK GUIDE) w/Device KIT Use to check blood sugars daily 1 kit 0   empagliflozin (JARDIANCE) 10 MG TABS tablet Take 1 tablet (10 mg total) by mouth daily with breakfast. 30 tablet 3   glucose blood (ACCU-CHEK GUIDE) test strip Use to check blood sugars daily 100 each 2   hydrALAZINE (APRESOLINE) 25 MG tablet TAKE ONE TABLET BY MOUTH TWICE A DAY FOR BLOOD PRESSURE     Leuprolide Acetate (LUPRON DEPOT, 40-MONTH, IM) Inject into the muscle.     metFORMIN (GLUCOPHAGE-XR) 500 MG 24 hr tablet TAKE TWO TABLETS BY MOUTH TWICE A DAY FOR DIABETES  (ANNUAL KIDNEY FUNCTION TESTING IS NEEDED)     MYRBETRIQ 50 MG TB24 tablet Take 1 tablet by mouth daily.     pioglitazone (ACTOS) 30 MG tablet TAKE 1 TABLET(30 MG) BY MOUTH DAILY 90 tablet 0   vitamin B-12 (CYANOCOBALAMIN) 500 MCG tablet Take 2 tablets by mouth daily.     No current facility-administered medications for this visit.    REVIEW OF SYSTEMS:   Constitutional: ( - ) fevers, ( - )  chills , ( - ) night sweats Eyes: ( - ) blurriness of vision, ( - ) double vision, ( - ) watery eyes Ears, nose, mouth, throat, and face: ( - ) mucositis, ( - ) sore throat Respiratory: ( - ) cough, ( - ) dyspnea, ( - ) wheezes Cardiovascular: ( - ) palpitation, ( - ) chest discomfort, ( - ) lower extremity swelling Gastrointestinal:  ( - ) nausea, ( - ) heartburn, ( - ) change in bowel habits Skin: ( - ) abnormal skin rashes Lymphatics: ( - ) new lymphadenopathy, ( - ) easy bruising Neurological: ( - ) numbness, ( - ) tingling, ( - ) new weaknesses Behavioral/Psych: ( - ) mood change, ( - ) new changes  All other systems were reviewed with the patient and are negative.  PHYSICAL EXAMINATION: ECOG PERFORMANCE STATUS: 1 - Symptomatic but completely ambulatory  Vitals:   10/28/22 1415  BP: 138/65  Pulse: 76  Resp: 18  SpO2: 98%   Filed Weights   10/28/22 1415  Weight: 210 lb 3 oz (95.3 kg)    GENERAL: Well-appearing elderly Caucasian male, alert, no distress and comfortable SKIN: skin color, texture, turgor are normal, no rashes or significant lesions EYES: conjunctiva are pink and non-injected, sclera clear LUNGS: clear to auscultation and percussion with normal breathing effort HEART: regular rate & rhythm and no murmurs and no lower extremity edema Musculoskeletal: no cyanosis of digits and no clubbing  PSYCH: alert & oriented x 3, fluent speech NEURO: no focal motor/sensory deficits  LABORATORY DATA:  I have reviewed the data as listed    Latest Ref Rng & Units 10/21/2022    8:54  AM 07/27/2022    3:45 PM 07/24/2022    8:40 PM  CBC  WBC 4.0 - 10.5 K/uL 6.6  9.5  12.1   Hemoglobin 13.0 - 17.0 g/dL 12.7  10.8  11.3   Hematocrit 39.0 - 52.0 % 38.5  32.4  35.8   Platelets 150 - 400 K/uL 228  337  242        Latest Ref Rng & Units 10/25/2022    8:36 AM 10/21/2022    8:54 AM 08/17/2022   11:14 AM  CMP  Glucose 70 - 99 mg/dL 115  147  153   BUN 6 - 23 mg/dL 31  40  23   Creatinine 0.40 - 1.50 mg/dL 1.37  1.44  1.33   Sodium 135 - 145 mEq/L 139  139  135   Potassium 3.5 - 5.1 mEq/L 5.3 No hemolysis seen  5.1  4.2   Chloride 96 - 112 mEq/L 103  104  100   CO2 19 - 32 mEq/L '30  28  27   '$ Calcium 8.4 - 10.5 mg/dL 9.6  9.3  9.5   Total Protein 6.5 - 8.1 g/dL  6.5    Total Bilirubin 0.3 - 1.2 mg/dL  0.4    Alkaline Phos 38 - 126 U/L  60    AST 15 - 41 U/L  15    ALT 0 - 44 U/L  11     RADIOGRAPHIC STUDIES: No results found.  ASSESSMENT & PLAN JAMARIS PIEKARSKI 77 y.o. male with medical history significant for castrate sensitive prostate cancer who presents for a follow up visit.   # Advanced Castrate Sensitive Prostate Cancer  -- continue eligard 30 mg q 4 months Subq -- PSA at last check on 10/21/2022 was <0.1 -- labs show Cr 1.44, WBC 6.6, Hgb 12.7, MCV 98.5, Plt 228 --No clear indication for imaging at this time. -- RTC in 4 months for repeat clinic visit and continued eligard.   No orders of the defined types were placed in this encounter.   All questions were answered. The patient knows to call the clinic with any problems, questions or concerns.  A total of more than 40 minutes were spent on this encounter with face-to-face time and non-face-to-face time, including preparing to see the patient, ordering tests and/or medications, counseling the patient and coordination of care as outlined above.   Ledell Peoples, MD Department of Hematology/Oncology [place] at [facility] Phone: [phone] Pager: [phone] Email:  [email or web address]  10/31/2022 6:48 PM

## 2022-10-28 NOTE — Progress Notes (Signed)
Patient ID: Richard [name], male   DOB: 30-Jan-1946, 77 y.o.   MRN: [id number]    Reason for Appointment:  Follow-up visit  History of Present Illness   Problem 1:   DIABETES MELITUS, date of diagnosis 1995 with a glucose of 320 and HemoglobinA1c of 9.6 Previous history: He has been on various oral hypoglycemic drugs in the past but for the last few years has required multiple drugs in combination; diabetes has been generally well controlled with A1c near normal  Recent history:   Oral hypoglycemic drugs:  metformin Er 1000 twice a day, Actos 30 mg daily  His A1c is at 6.6 % compared to 6.1   Current management, blood sugar patterns and problems identified: He is coming back for short-term follow-up, previously had severe hypoglycemia with renal insufficiency in November He did not bring his monitor for download today However he thinks his blood sugars are frequently in the 170s over 90s after meals especially lunch when he is checking Fasting readings are reportedly fairly good and was 115 in the lab He has been feeling fairly good but has not gone back to his walking routine No recent change in diet and no significant weight change He does tend to eat fruits regularly such as bananas Currently no side effects with full dose of metformin ER   Side effects from medications: None       Monitors blood glucose:  irregularly     Glucometer: Freestyle lite  His monitor has incorrect time programmed  Home blood Glucose from recall:  Fasting range 110-120 Pc 170-190s  Previous blood sugars late evening are ranging from 102 up to 190 and midday 162   Dietician visit: Most recent:? 2007    Weight control:      Wt Readings from Last 3 Encounters:  10/28/22 209 lb (94.8 kg)  08/20/22 207 lb (93.9 kg)  07/27/22 215 lb (97.5 kg)    Diabetes labs:  Lab Results  Component Value Date   HGBA1C 6.6 (H) 10/25/2022   HGBA1C 6.1 08/17/2022   HGBA1C 6.0 04/27/2022    Lab Results  Component Value Date   MICROALBUR 26.7 (H) 08/17/2022   LDLCALC 68 08/17/2022   CREATININE 1.37 10/25/2022    Other problems addressed today: See review of systems   Lab on 10/25/2022  Component Date Value Ref Range Status   Sodium 10/25/2022 139  135 - 145 mEq/L Final   Potassium 10/25/2022 5.3 No hemolysis seen (H)  3.5 - 5.1 mEq/L Final   Chloride 10/25/2022 103  96 - 112 mEq/L Final   CO2 10/25/2022 30  19 - 32 mEq/L Final   Glucose, Bld 10/25/2022 115 (H)  70 - 99 mg/dL Final   BUN 10/25/2022 31 (H)  6 - 23 mg/dL Final   Creatinine, Ser 10/25/2022 1.37  0.40 - 1.50 mg/dL Final   GFR 10/25/2022 49.67 (L)  >60.00 mL/min Final   Calculated using the CKD-EPI Creatinine Equation (2021)   Calcium 10/25/2022 9.6  8.4 - 10.5 mg/dL Final   Hgb A1c MFr Bld 10/25/2022 6.6 (H)  4.6 - 6.5 % Final   Glycemic Control Guidelines for People with Diabetes:Non Diabetic:  <6%Goal of Therapy: <7%Additional Action Suggested:  >8%      Allergies as of 10/28/2022       Reactions   Effexor Xr [venlafaxine Hcl] Rash        Medication List        Accurate as of October 28, 2022  8:20 AM. If you have any questions, ask your nurse or doctor.          STOP taking these medications    dextrose 40 % Gel Commonly known as: GLUTOSE Stopped by: Richard Snare, Richard [name]   losartan 100 MG tablet Commonly known as: COZAAR Stopped by: Richard Snare, Richard [name]   onetouch ultrasoft lancets Stopped by: Richard Snare, Richard [name]       TAKE these medications    Accu-Chek Guide test strip Generic drug: glucose blood Use to check blood sugars daily   Accu-Chek Guide w/Device Kit Use to check blood sugars daily   amLODipine 10 MG tablet Commonly known as: NORVASC Take 1 tablet (10 mg total) by mouth daily.   atorvastatin 10 MG tablet Commonly known as: LIPITOR TAKE 1 TABLET(10 MG) BY MOUTH DAILY   cyanocobalamin 500 MCG tablet Commonly known as: VITAMIN B12 Take 2 tablets by mouth daily.    hydrALAZINE 25 MG tablet Commonly known as: APRESOLINE TAKE ONE TABLET BY MOUTH TWICE A DAY FOR BLOOD PRESSURE   LUPRON DEPOT (82-MONTH) IM Inject into the muscle.   metFORMIN 500 MG 24 hr tablet Commonly known as: GLUCOPHAGE-XR TAKE TWO TABLETS BY MOUTH TWICE A DAY FOR DIABETES (ANNUAL KIDNEY FUNCTION TESTING IS NEEDED)   Myrbetriq 50 MG Tb24 tablet Generic drug: mirabegron ER Take 1 tablet by mouth daily.   pioglitazone 30 MG tablet Commonly known as: ACTOS TAKE 1 TABLET(30 MG) BY MOUTH DAILY        Allergies:  Allergies  Allergen Reactions   Effexor Xr [Venlafaxine Hcl] Rash    Past Medical History:  Diagnosis Date   Diabetes mellitus without complication ([place])    Hypertension    prostate ca dx'd 2006   prostatectomy and xrt    No past surgical history on file.  Family History  Problem Relation Age of Onset   Hypertension Father    Heart disease Neg Hx    Diabetes Neg Hx     Social History:  reports that he has been smoking. He has never used smokeless tobacco. No history on file for alcohol use and drug use.    REVIEW OF SYSTEMS:       Hypertension: Was on  losartan previously and this was stopped in 11/23 He is continuing on 25 mg hydralazine 3 times daily from his PCP Also on amlodipine 10 mg  He is not monitoring blood pressure at home  BP Readings from Last 3 Encounters:  10/28/22 122/80  08/20/22 132/74  07/27/22 135/69    Has relatively high creatinine levels for some time Creatinine is stable to slightly better Losartan has been stopped  However his potassium is slightly high, he tends to eat bananas regularly   Lab Results  Component Value Date   CREATININE 1.37 10/25/2022   CREATININE 1.44 (H) 10/21/2022   CREATININE 1.33 08/17/2022   Lab Results  Component Value Date   K 5.3 No hemolysis seen (H) 10/25/2022     Lipids:  he has been treated with Lipitor 10 mg LDL is consistently around 70 with consistent regimen of  medication   Lab Results  Component Value Date   CHOL 171 08/17/2022   CHOL 162 09/28/2021   CHOL 160 04/27/2021   Lab Results  Component Value Date   HDL 76.70 08/17/2022   HDL 73.80 09/28/2021   HDL 59.90 04/27/2021   Lab Results  Component Value Date   LDLCALC 68 08/17/2022   [place] 70 09/28/2021  [place] 72 04/27/2021   Lab Results  Component Value Date   TRIG 129.0 08/17/2022   TRIG 89.0 09/28/2021   TRIG 142.0 04/27/2021   Lab Results  Component Value Date   CHOLHDL 2 08/17/2022   CHOLHDL 2 09/28/2021   CHOLHDL 3 04/27/2021   Lab Results  Component Value Date   LDLDIRECT 84.0 03/30/2021      History of vitamin B12 deficiency on supplement Has mild chronic anemia His last B12 level is normal   [facility], [facility] again not available  Last foot exam 2/24  LABS:  Lab on 10/25/2022  Component Date Value Ref Range Status   Sodium 10/25/2022 139  135 - 145 mEq/L Final   Potassium 10/25/2022 5.3 No hemolysis seen (H)  3.5 - 5.1 mEq/L Final   Chloride 10/25/2022 103  96 - 112 mEq/L Final   CO2 10/25/2022 30  19 - 32 mEq/L Final   Glucose, Bld 10/25/2022 115 (H)  70 - 99 mg/dL Final   BUN 10/25/2022 31 (H)  6 - 23 mg/dL Final   Creatinine, Ser 10/25/2022 1.37  0.40 - 1.50 mg/dL Final   GFR 10/25/2022 49.67 (L)  >60.00 mL/min Final   Calculated using the CKD-EPI Creatinine Equation (2021)   Calcium 10/25/2022 9.6  8.4 - 10.5 mg/dL Final   Hgb A1c MFr Bld 10/25/2022 6.6 (H)  4.6 - 6.5 % Final   Glycemic Control Guidelines for People with Diabetes:Non Diabetic:  <6%Goal of Therapy: <7%Additional Action Suggested:  >8%      Examination:   BP 122/80 (BP Location: Left Arm, Patient Position: Sitting, Cuff Size: Large)   Pulse 70   Ht 5' 10.5" (1.791 m)   Wt 209 lb (94.8 kg)   SpO2 96%   BMI 29.56 kg/m   Body mass index is 29.56 kg/m.   Diabetic Foot Exam - Simple   Simple Foot Form Diabetic Foot exam was performed with  the following findings: Yes   Visual Inspection No deformities, no ulcerations, no other skin breakdown bilaterally: Yes Sensation Testing Intact to touch and monofilament testing bilaterally: Yes Pulse Check Posterior Tibialis and Dorsalis pulse intact bilaterally: Yes Comments     ASSESSMENT/ PLAN:    Diabetes type 2 with mild obesity:    He is on Actos and metformin ER 2 g daily  A1c is higher with stopping Amaryl at 6.6  Has not had any change in diet but has not exercise However likely without glimepiride he is showing postprandial hyperglycemia indicating some insulin deficiency  He needs to be on a additional drug and Jardiance would be good option especially with his underlying mild chronic kidney disease Discussed action of SGLT 2 drugs on lowering glucose by decreasing kidney absorption of glucose, benefits of weight loss and lower blood pressure, possible side effects including candidiasis and dosage regimen  He will start with 10 mg daily, sample given Also need to reduce his amlodipine to half a tablet to avoid any decrease in blood pressure Start checking blood pressure regularly at home He will check his blood pressure more after meals and bring monitor for download on each visit   Hypertension: Blood pressure is fairly well controlled with hydralazine and amlodipine    CKD: With stopping losartan his creatinine is stable However has mild increase in potassium 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 Any transcriptional errors that result from this process are unintentional.

## 2022-10-31 ENCOUNTER — Encounter: Payer: Self-pay | Admitting: Hematology and Oncology

## 2022-12-09 ENCOUNTER — Telehealth: Payer: Medicare PPO | Admitting: Physician Assistant

## 2022-12-09 DIAGNOSIS — B9689 Other specified bacterial agents as the cause of diseases classified elsewhere: Secondary | ICD-10-CM

## 2022-12-09 DIAGNOSIS — J208 Acute bronchitis due to other specified organisms: Secondary | ICD-10-CM | POA: Diagnosis not present

## 2022-12-09 MED ORDER — DOXYCYCLINE HYCLATE 100 MG PO TABS: 100.0000 mg | ORAL_TABLET | Freq: Two times a day (BID) | ORAL | 0 refills | Status: DC

## 2022-12-09 MED ORDER — BENZONATATE 100 MG PO CAPS: 100.0000 mg | ORAL_CAPSULE | Freq: Three times a day (TID) | ORAL | 0 refills | Status: DC | PRN

## 2022-12-09 NOTE — Patient Instructions (Signed)
Richard [name], thank you for joining [name], PA-C for today's virtual visit.  While this provider is not your primary care provider (PCP), if your PCP is located in our provider database this encounter information will be shared with them immediately following your visit.   A [facility] MyChart account gives you access to today's visit and all your visits, tests, and labs performed at [facility] " click here if you don't have a [facility] MyChart account or go to [email or web address]  Consent: (Patient) Richard [name] provided verbal consent for this virtual visit at the beginning of the encounter.  Current Medications:  Current Outpatient Medications:    amLODipine (NORVASC) 10 MG tablet, Take 1 tablet (10 mg total) by mouth daily. (Patient taking differently: Take 10 mg by mouth daily. Take 1/2 tablet daily), Disp: 90 tablet, Rfl: 2   atorvastatin (LIPITOR) 10 MG tablet, TAKE 1 TABLET(10 MG) BY MOUTH DAILY, Disp: 90 tablet, Rfl: 0   Blood Glucose Monitoring Suppl (ACCU-CHEK GUIDE) w/Device KIT, Use to check blood sugars daily, Disp: 1 kit, Rfl: 0   empagliflozin (JARDIANCE) 10 MG TABS tablet, Take 1 tablet (10 mg total) by mouth daily with breakfast., Disp: 30 tablet, Rfl: 3   glucose blood (ACCU-CHEK GUIDE) test strip, Use to check blood sugars daily, Disp: 100 each, Rfl: 2   hydrALAZINE (APRESOLINE) 25 MG tablet, TAKE ONE TABLET BY MOUTH TWICE A DAY FOR BLOOD PRESSURE, Disp: , Rfl:    Leuprolide Acetate (LUPRON DEPOT, 56-MONTH, IM), Inject into the muscle., Disp: , Rfl:    metFORMIN (GLUCOPHAGE-XR) 500 MG 24 hr tablet, TAKE TWO TABLETS BY MOUTH TWICE A DAY FOR DIABETES (ANNUAL KIDNEY FUNCTION TESTING IS NEEDED), Disp: , Rfl:    MYRBETRIQ 50 MG TB24 tablet, Take 1 tablet by mouth daily., Disp: , Rfl:    pioglitazone (ACTOS) 30 MG tablet, TAKE 1 TABLET(30 MG) BY MOUTH DAILY, Disp: 90 tablet, Rfl: 0   vitamin B-12 (CYANOCOBALAMIN) 500 MCG tablet, Take 2  tablets by mouth daily., Disp: , Rfl:    Medications ordered in this encounter:  No orders of the defined types were placed in this encounter.    *If you need refills on other medications prior to your next appointment, please contact your pharmacy*  Follow-Up: Call back or seek an in-person evaluation if the symptoms worsen or if the condition fails to improve as anticipated.  [facility] [phone]  Other Instructions Take antibiotic (Doxycycline) as directed.  Increase fluids.  Get plenty of rest. Use Mucinex-DM that you have for congestion. Use the Tessalon as directed for cough. Take a daily probiotic (I recommend Align or Culturelle, but even Activia Yogurt may be beneficial).  A humidifier placed in the bedroom may offer some relief for a dry, scratchy throat of nasal irritation.  Read information below on acute bronchitis. Please call or return to clinic if symptoms are not improving.  Acute Bronchitis Bronchitis is when the airways that extend from the windpipe into the lungs get red, puffy, and painful (inflamed). Bronchitis often causes thick spit (mucus) to develop. This leads to a cough. A cough is the most common symptom of bronchitis. In acute bronchitis, the condition usually begins suddenly and goes away over time (usually in 2 weeks). Smoking, allergies, and asthma can make bronchitis worse. Repeated episodes of bronchitis may cause more lung problems.  HOME CARE Rest. Drink enough fluids to keep your pee (urine) clear or pale yellow (unless you need  to limit fluids as told by your doctor). Only take over-the-counter or prescription medicines as told by your doctor. Avoid smoking and secondhand smoke. These can make bronchitis worse. If you are a smoker, think about using nicotine gum or skin patches. Quitting smoking will help your lungs heal faster. Reduce the chance of getting bronchitis again by: Washing your hands often. Avoiding people with cold  symptoms. Trying not to touch your hands to your mouth, nose, or eyes. Follow up with your doctor as told.  GET HELP IF: Your symptoms do not improve after 1 week of treatment. Symptoms include: Cough. Fever. Coughing up thick spit. Body aches. Chest congestion. Chills. Shortness of breath. Sore throat.  GET HELP RIGHT AWAY IF:  You have an increased fever. You have chills. You have severe shortness of breath. You have bloody thick spit (sputum). You throw up (vomit) often. You lose too much body fluid (dehydration). You have a severe headache. You faint.  MAKE SURE YOU:  Understand these instructions. Will watch your condition. Will get help right away if you are not doing well or get worse. Document Released: 02/02/2008 Document Revised: 04/18/2013 Document Reviewed: 02/06/2013 [facility] Patient Information 2015 [place], [place]. This information is not intended to replace advice given to you by your health care provider. Make sure you discuss any questions you have with your health care provider.    If you have been instructed to have an in-person evaluation today at a local Urgent Care facility, please use the link below. It will take you to a list of all of our available [facility], including address, phone number and hours of operation. Please do not delay care.  [facility]  If you or a family member do not have a primary care provider, use the link below to schedule a visit and establish care. When you choose a [facility] primary care physician or advanced practice provider, you gain a long-term partner in health. Find a Primary Care Provider  Learn more about [facility] [place]'s in-office and virtual care options: [facility] - Get Care Now

## 2022-12-09 NOTE — Progress Notes (Signed)
Virtual Visit Consent   Richard GrosserJohn M [name], you are scheduled for a virtual visit with a [facility] provider today. Just as with appointments in the office, your consent must be obtained to participate. Your consent will be active for this visit and any virtual visit you may have with one of our providers in the next 365 days. If you have a MyChart account, a copy of this consent can be sent to you electronically.  As this is a virtual visit, video technology does not allow for your provider to perform a traditional examination. This may limit your provider's ability to fully assess your condition. If your provider identifies any concerns that need to be evaluated in person or the need to arrange testing (such as labs, EKG, etc.), we will make arrangements to do so. Although advances in technology are sophisticated, we cannot ensure that it will always work on either your end or our end. If the connection with a video visit is poor, the visit may have to be switched to a telephone visit. With either a video or telephone visit, we are not always able to ensure that we have a secure connection.  By engaging in this virtual visit, you consent to the provision of healthcare and authorize for your insurance to be billed (if applicable) for the services provided during this visit. Depending on your insurance coverage, you may receive a charge related to this service.  I need to obtain your verbal consent now. Are you willing to proceed with your visit today? Richard GrosserJohn M Nold has provided verbal consent on 12/09/2022 for a virtual visit (video or telephone). Piedad ClimesWilliam Cody Kynlea Blackston, [place] JerseyPA-C  Date: 12/09/2022 12:55 PM  Virtual Visit via Video Note   I, Piedad ClimesWilliam Cody Madelein Mahadeo, connected with  Richard GrosserJohn M Carlson  ([id number], 01/06/1946) on 12/09/22 at  1:00 PM EDT by a video-enabled telemedicine application and verified that I am speaking with the correct person using two identifiers.  Location: Patient: Virtual Visit  Location Patient: Home Provider: Virtual Visit Location Provider: Home Office   I discussed the limitations of evaluation and management by telemedicine and the availability of in person appointments. The patient expressed understanding and agreed to proceed.    History of Present Illness: Richard GrosserJohn M Mumaw is a 77 y.o. who identifies as a male who was assigned male at birth, and is being seen today for URI symptoms over the past week and a half that he originally contributing to allergy symptoms, built now with substantial chest and head congestion with productive cough, sinus pain. Denies chest pain or SOB. Has taken OTC Mucinex and zyrtec without much improvement in symptoms.    HPI: HPI  Problems:  Patient Active Problem List   Diagnosis Date Noted   Prostate cancer 10/28/2022   Post traumatic stress disorder (PTSD) 07/15/2014   Vitamin B 12 deficiency 12/12/2013   H/O prostate cancer 12/12/2013   Unspecified essential hypertension 08/10/2013   Other and unspecified hyperlipidemia 08/10/2013   Type II or unspecified type diabetes mellitus without mention of complication, not stated as uncontrolled 08/07/2013    Allergies:  Allergies  Allergen Reactions   Effexor Xr [Venlafaxine Hcl] Rash   Medications:  Current Outpatient Medications:    benzonatate (TESSALON) 100 MG capsule, Take 1 capsule (100 mg total) by mouth 3 (three) times daily as needed for cough., Disp: 30 capsule, Rfl: 0   doxycycline (VIBRA-TABS) 100 MG tablet, Take 1 tablet (100 mg total) by mouth 2 (two) times daily., Disp: 14  tablet, Rfl: 0   amLODipine (NORVASC) 10 MG tablet, Take 1 tablet (10 mg total) by mouth daily. (Patient taking differently: Take 10 mg by mouth daily. Take 1/2 tablet daily), Disp: 90 tablet, Rfl: 2   atorvastatin (LIPITOR) 10 MG tablet, TAKE 1 TABLET(10 MG) BY MOUTH DAILY, Disp: 90 tablet, Rfl: 0   Blood Glucose Monitoring Suppl (ACCU-CHEK GUIDE) w/Device KIT, Use to check blood sugars daily,  Disp: 1 kit, Rfl: 0   empagliflozin (JARDIANCE) 10 MG TABS tablet, Take 1 tablet (10 mg total) by mouth daily with breakfast., Disp: 30 tablet, Rfl: 3   glucose blood (ACCU-CHEK GUIDE) test strip, Use to check blood sugars daily, Disp: 100 each, Rfl: 2   hydrALAZINE (APRESOLINE) 25 MG tablet, TAKE ONE TABLET BY MOUTH TWICE A DAY FOR BLOOD PRESSURE, Disp: , Rfl:    Leuprolide Acetate (LUPRON DEPOT, 88-MONTH, IM), Inject into the muscle., Disp: , Rfl:    metFORMIN (GLUCOPHAGE-XR) 500 MG 24 hr tablet, TAKE TWO TABLETS BY MOUTH TWICE A DAY FOR DIABETES (ANNUAL KIDNEY FUNCTION TESTING IS NEEDED), Disp: , Rfl:    MYRBETRIQ 50 MG TB24 tablet, Take 1 tablet by mouth daily., Disp: , Rfl:    pioglitazone (ACTOS) 30 MG tablet, TAKE 1 TABLET(30 MG) BY MOUTH DAILY, Disp: 90 tablet, Rfl: 0   vitamin B-12 (CYANOCOBALAMIN) 500 MCG tablet, Take 2 tablets by mouth daily., Disp: , Rfl:   Observations/Objective: Patient is well-developed, well-nourished in no acute distress.  Resting comfortably at home.  Head is normocephalic, atraumatic.  No labored breathing. Speech is clear and coherent with logical content.  Patient is alert and oriented at baseline.   Assessment and Plan: 1. Acute bacterial bronchitis - doxycycline (VIBRA-TABS) 100 MG tablet; Take 1 tablet (100 mg total) by mouth 2 (two) times daily.  Dispense: 14 tablet; Refill: 0 - benzonatate (TESSALON) 100 MG capsule; Take 1 capsule (100 mg total) by mouth 3 (three) times daily as needed for cough.  Dispense: 30 capsule; Refill: 0  Rx Doxycycline.  Increase fluids.  Rest.  Saline nasal spray.  Probiotic.  Mucinex as directed.  Humidifier in bedroom. Tessalon per orders.  Call or return to clinic if symptoms are not improving.   Follow Up Instructions: I discussed the assessment and treatment plan with the patient. The patient was provided an opportunity to ask questions and all were answered. The patient agreed with the plan and demonstrated an  understanding of the instructions.  A copy of instructions were sent to the patient via MyChart unless otherwise noted below.   The patient was advised to call back or seek an in-person evaluation if the symptoms worsen or if the condition fails to improve as anticipated.  Time:  I spent 10 minutes with the patient via telehealth technology discussing the above problems/concerns.    [name], PA-C

## 2022-12-22 ENCOUNTER — Telehealth: Payer: Self-pay | Admitting: Hematology and Oncology

## 2023-01-10 ENCOUNTER — Other Ambulatory Visit: Payer: Medicare PPO

## 2023-01-10 ENCOUNTER — Other Ambulatory Visit (INDEPENDENT_AMBULATORY_CARE_PROVIDER_SITE_OTHER): Payer: Medicare PPO

## 2023-01-10 DIAGNOSIS — E1165 Type 2 diabetes mellitus with hyperglycemia: Secondary | ICD-10-CM | POA: Diagnosis not present

## 2023-01-10 LAB — BASIC METABOLIC PANEL
BUN: 25 mg/dL — ABNORMAL HIGH (ref 6–23)
CO2: 28 mEq/L (ref 19–32)
Calcium: 9.1 mg/dL (ref 8.4–10.5)
Chloride: 103 mEq/L (ref 96–112)
Creatinine, Ser: 1.39 mg/dL (ref 0.40–1.50)
GFR: 48.74 mL/min — ABNORMAL LOW (ref >60.00)
Glucose, Bld: 103 mg/dL — ABNORMAL HIGH (ref 70–99)
Potassium: 4.8 mEq/L (ref 3.5–5.1)
Sodium: 138 mEq/L (ref 135–145)

## 2023-01-10 LAB — HEMOGLOBIN A1C: Hgb A1c MFr Bld: 6.5 % (ref 4.6–6.5)

## 2023-01-12 ENCOUNTER — Encounter: Payer: Self-pay | Admitting: Endocrinology

## 2023-01-12 ENCOUNTER — Ambulatory Visit: Payer: Medicare PPO | Admitting: Endocrinology

## 2023-01-12 VITALS — BP 130/68 | HR 90 | Resp 18 | Ht 70.5 in | Wt 205.0 lb

## 2023-01-12 DIAGNOSIS — E1122 Type 2 diabetes mellitus with diabetic chronic kidney disease: Secondary | ICD-10-CM | POA: Diagnosis not present

## 2023-01-12 DIAGNOSIS — I10 Essential (primary) hypertension: Secondary | ICD-10-CM

## 2023-01-12 DIAGNOSIS — E1169 Type 2 diabetes mellitus with other specified complication: Secondary | ICD-10-CM | POA: Diagnosis not present

## 2023-01-12 DIAGNOSIS — Z7984 Long term (current) use of oral hypoglycemic drugs: Secondary | ICD-10-CM

## 2023-01-12 DIAGNOSIS — N1831 Chronic kidney disease, stage 3a: Secondary | ICD-10-CM

## 2023-01-12 DIAGNOSIS — E669 Obesity, unspecified: Secondary | ICD-10-CM | POA: Diagnosis not present

## 2023-01-12 DIAGNOSIS — E119 Type 2 diabetes mellitus without complications: Secondary | ICD-10-CM

## 2023-01-12 NOTE — Progress Notes (Signed)
Patient ID: Richard [name], male   DOB: 03/07/1946, 77 y.o.   MRN: [id number]    Reason for Appointment:  Follow-up visit  History of Present Illness   Problem 1:   DIABETES MELITUS, date of diagnosis 1995 with a glucose of 320 and HemoglobinA1c of 9.6 Previous history: He has been on various oral hypoglycemic drugs in the past but for the last few years has required multiple drugs in combination; diabetes has been generally well controlled with A1c near normal  Recent history:   Oral hypoglycemic drugs:  metformin Er 1000 twice a day, Actos 30 mg daily, Jardiance 10 mg daily  His A1c is at 6.5  Current management, blood sugar patterns and problems identified:  He did bring his monitor for download today He is checking his blood sugars somewhat sporadically and mostly in the mornings; does not appear to have any high readings lately No difficulties taking Jardiance He has not done any regular exercise However with starting Jardiance he has lost 5 pounds   Side effects from medications: None (hypoglycemia from Amaryl)       Monitors blood glucose:  irregularly     Glucometer: Freestyle lite   Home blood Glucose recently AVERAGE 114 for the last 2 months [place] 96-110, after breakfast 157 and after lunch 115  Previously by recall: Fasting range 110-120 Pc 170-190s  Previous blood sugars late evening are ranging from 102 up to 190 and midday 162  Dietician visit: Most recent:? 2007    Weight control:      Wt Readings from Last 3 Encounters:  01/12/23 205 lb (93 kg)  10/28/22 210 lb 3 oz (95.3 kg)  10/28/22 209 lb (94.8 kg)    Diabetes labs:  Lab Results  Component Value Date   HGBA1C 6.5 01/10/2023   HGBA1C 6.6 (H) 10/25/2022   HGBA1C 6.1 08/17/2022   Lab Results  Component Value Date   MICROALBUR 26.7 (H) 08/17/2022   LDLCALC 68 08/17/2022   CREATININE 1.39 01/10/2023    Other problems addressed today: See review of  systems   Appointment on 01/10/2023  Component Date Value Ref Range Status   Sodium 01/10/2023 138  135 - 145 mEq/L Final   Potassium 01/10/2023 4.8  3.5 - 5.1 mEq/L Final   Chloride 01/10/2023 103  96 - 112 mEq/L Final   CO2 01/10/2023 28  19 - 32 mEq/L Final   Glucose, Bld 01/10/2023 103 (H)  70 - 99 mg/dL Final   BUN 84/69/6295 25 (H)  6 - 23 mg/dL Final   Creatinine, Ser 01/10/2023 1.39  0.40 - 1.50 mg/dL Final   GFR 28/41/3244 48.74 (L)  >60.00 mL/min Final   Calculated using the CKD-EPI Creatinine Equation (2021)   Calcium 01/10/2023 9.1  8.4 - 10.5 mg/dL Final   Hgb W1U MFr Bld 01/10/2023 6.5  4.6 - 6.5 % Final   Glycemic Control Guidelines for People with Diabetes:Non Diabetic:  <6%Goal of Therapy: <7%Additional Action Suggested:  >8%      Allergies as of 01/12/2023       Reactions   Effexor Xr [venlafaxine Hcl] Rash        Medication List        Accurate as of Jan 12, 2023 10:23 AM. If you have any questions, ask your nurse or doctor.          Accu-Chek Guide test strip Generic drug: glucose blood Use to check blood sugars daily   Accu-Chek Guide w/Device Kit  Use to check blood sugars daily   amLODipine 10 MG tablet Commonly known as: NORVASC Take 1 tablet (10 mg total) by mouth daily. What changed: additional instructions   atorvastatin 10 MG tablet Commonly known as: LIPITOR TAKE 1 TABLET(10 MG) BY MOUTH DAILY   benzonatate 100 MG capsule Commonly known as: TESSALON Take 1 capsule (100 mg total) by mouth 3 (three) times daily as needed for cough.   cyanocobalamin 500 MCG tablet Commonly known as: VITAMIN B12 Take 2 tablets by mouth daily.   doxycycline 100 MG tablet Commonly known as: VIBRA-TABS Take 1 tablet (100 mg total) by mouth 2 (two) times daily.   empagliflozin 10 MG Tabs tablet Commonly known as: Jardiance Take 1 tablet (10 mg total) by mouth daily with breakfast.   hydrALAZINE 25 MG tablet Commonly known as: APRESOLINE TAKE  ONE TABLET BY MOUTH TWICE A DAY FOR BLOOD PRESSURE   LUPRON DEPOT (72-MONTH) IM Inject into the muscle.   metFORMIN 500 MG 24 hr tablet Commonly known as: GLUCOPHAGE-XR TAKE TWO TABLETS BY MOUTH TWICE A DAY FOR DIABETES (ANNUAL KIDNEY FUNCTION TESTING IS NEEDED)   Myrbetriq 50 MG Tb24 tablet Generic drug: mirabegron ER Take 1 tablet by mouth daily.   pioglitazone 30 MG tablet Commonly known as: ACTOS TAKE 1 TABLET(30 MG) BY MOUTH DAILY        Allergies:  Allergies  Allergen Reactions   Effexor Xr [Venlafaxine Hcl] Rash    Past Medical History:  Diagnosis Date   Diabetes mellitus without complication (HCC)    Hypertension    prostate ca dx'd 2006   prostatectomy and xrt    No past surgical history on file.  Family History  Problem Relation Age of Onset   Hypertension Father    Heart disease Neg Hx    Diabetes Neg Hx     Social History:  reports that he has been smoking. He has never used smokeless tobacco. No history on file for alcohol use and drug use.    REVIEW OF SYSTEMS:       Hypertension: Was on  losartan previously and this was stopped in 11/23 He is continuing on 25 mg hydralazine 3 times daily from his PCP Also on amlodipine 5 mg  He is monitoring blood pressure at home and readings are about 130/70  BP Readings from Last 3 Encounters:  01/12/23 130/68  10/28/22 138/65  10/28/22 122/80    Has relatively high creatinine levels for some time Creatinine is stable Losartan has been stopped partly because of hyperkalemia  Lab Results  Component Value Date   CREATININE 1.39 01/10/2023   CREATININE 1.37 10/25/2022   CREATININE 1.44 (H) 10/21/2022   Lab Results  Component Value Date   K 4.8 01/10/2023     Lipids:  he has been treated with Lipitor 10 mg LDL is consistently around 70 with consistent regimen of medication   Lab Results  Component Value Date   CHOL 171 08/17/2022   CHOL 162 09/28/2021   CHOL 160 04/27/2021   Lab  Results  Component Value Date   HDL 76.70 08/17/2022   HDL 73.80 09/28/2021   HDL 59.90 04/27/2021   Lab Results  Component Value Date   LDLCALC 68 08/17/2022   LDLCALC 70 09/28/2021   LDLCALC 72 04/27/2021   Lab Results  Component Value Date   TRIG 129.0 08/17/2022   TRIG 89.0 09/28/2021   TRIG 142.0 04/27/2021   Lab Results  Component Value Date   CHOLHDL  2 08/17/2022   CHOLHDL 2 09/28/2021   CHOLHDL 3 04/27/2021   Lab Results  Component Value Date   LDLDIRECT 84.0 03/30/2021      History of vitamin B12 deficiency on supplement Has mild chronic anemia His last B12 level is normal   [facility], [facility] again not available  Last foot exam 2/24  LABS:  Appointment on 01/10/2023  Component Date Value Ref Range Status   Sodium 01/10/2023 138  135 - 145 mEq/L Final   Potassium 01/10/2023 4.8  3.5 - 5.1 mEq/L Final   Chloride 01/10/2023 103  96 - 112 mEq/L Final   CO2 01/10/2023 28  19 - 32 mEq/L Final   Glucose, Bld 01/10/2023 103 (H)  70 - 99 mg/dL Final   BUN 16/05/9603 25 (H)  6 - 23 mg/dL Final   Creatinine, Ser 01/10/2023 1.39  0.40 - 1.50 mg/dL Final   GFR 54/04/8118 48.74 (L)  >60.00 mL/min Final   Calculated using the CKD-EPI Creatinine Equation (2021)   Calcium 01/10/2023 9.1  8.4 - 10.5 mg/dL Final   Hgb J4N MFr Bld 01/10/2023 6.5  4.6 - 6.5 % Final   Glycemic Control Guidelines for People with Diabetes:Non Diabetic:  <6%Goal of Therapy: <7%Additional Action Suggested:  >8%      Examination:   BP 130/68   Pulse 90   Resp 18   Ht 5' 10.5" (1.791 m)   Wt 205 lb (93 kg)   SpO2 99%   BMI 29.00 kg/m   Body mass index is 29 kg/m.      ASSESSMENT/ PLAN:    Diabetes type 2 with mild obesity:    He is on Actos, Jardiance and metformin ER 2 g daily  A1c is 6.5  Not clear if his postprandial readings are controlled with current monitoring at home being inadequate However overall blood sugars are excellent and lab  glucose 103  Likely benefiting from [place]  For now we will continue the same regimen Emphasized the need to check readings after meals and call if consistently high May consider increasing the dose of Jardiance if needed Start regular walking   Hypertension: Blood pressure is well controlled with hydralazine and reduced dose of amlodipine    CKD: With stopping losartan his creatinine is stable Potassium better and he will continue to restrict high potassium foods Also continue Jardiance   Reather Littler 01/12/2023, 10:23 AM     Note: This office note was prepared with Dragon voice recognition system technology. Any transcriptional errors that result from this process are unintentional.

## 2023-01-12 NOTE — Patient Instructions (Signed)
Check blood sugars on waking up 2 days a week  Also check blood sugars about 2 hours after meals and do this after different meals by rotation  Recommended blood sugar levels on waking up are 90-130 and about 2 hours after meal is 130-160  Please bring your blood sugar monitor to each visit, thank you   

## 2023-02-23 ENCOUNTER — Inpatient Hospital Stay: Payer: Medicare PPO

## 2023-02-23 ENCOUNTER — Inpatient Hospital Stay: Payer: Medicare PPO | Admitting: Hematology and Oncology

## 2023-03-02 ENCOUNTER — Inpatient Hospital Stay: Payer: Medicare PPO

## 2023-03-02 ENCOUNTER — Inpatient Hospital Stay (HOSPITAL_BASED_OUTPATIENT_CLINIC_OR_DEPARTMENT_OTHER): Payer: Medicare PPO | Admitting: Hematology and Oncology

## 2023-03-02 ENCOUNTER — Inpatient Hospital Stay: Payer: Medicare PPO | Attending: Hematology and Oncology

## 2023-03-02 ENCOUNTER — Other Ambulatory Visit: Payer: Self-pay

## 2023-03-02 ENCOUNTER — Other Ambulatory Visit: Payer: Self-pay | Admitting: Hematology and Oncology

## 2023-03-02 VITALS — BP 138/82 | HR 76 | Temp 97.7°F | Resp 16 | Wt 201.3 lb

## 2023-03-02 DIAGNOSIS — C61 Malignant neoplasm of prostate: Secondary | ICD-10-CM | POA: Diagnosis not present

## 2023-03-02 DIAGNOSIS — Z5111 Encounter for antineoplastic chemotherapy: Secondary | ICD-10-CM | POA: Insufficient documentation

## 2023-03-02 DIAGNOSIS — Z8546 Personal history of malignant neoplasm of prostate: Secondary | ICD-10-CM

## 2023-03-02 LAB — CMP (CANCER CENTER ONLY)
ALT: 11 U/L (ref 0–44)
AST: 16 U/L (ref 15–41)
Albumin: 4.1 g/dL (ref 3.5–5.0)
Alkaline Phosphatase: 60 U/L (ref 38–126)
Anion gap: 6 (ref 5–15)
BUN: 22 mg/dL (ref 8–23)
CO2: 30 mmol/L (ref 22–32)
Calcium: 9.8 mg/dL (ref 8.9–10.3)
Chloride: 103 mmol/L (ref 98–111)
Creatinine: 1.51 mg/dL — ABNORMAL HIGH (ref 0.61–1.24)
GFR, Estimated: 47 mL/min — ABNORMAL LOW (ref >60)
Glucose, Bld: 112 mg/dL — ABNORMAL HIGH (ref 70–99)
Potassium: 4.9 mmol/L (ref 3.5–5.1)
Sodium: 139 mmol/L (ref 135–145)
Total Bilirubin: 0.5 mg/dL (ref 0.3–1.2)
Total Protein: 6.4 g/dL — ABNORMAL LOW (ref 6.5–8.1)

## 2023-03-02 LAB — CBC WITH DIFFERENTIAL (CANCER CENTER ONLY)
Abs Immature Granulocytes: 0.01 10*3/uL (ref 0.00–0.07)
Basophils Absolute: 0 10*3/uL (ref 0.0–0.1)
Basophils Relative: 1 %
Eosinophils Absolute: 0.1 10*3/uL (ref 0.0–0.5)
Eosinophils Relative: 2 %
HCT: 43.5 % (ref 39.0–52.0)
Hemoglobin: 13.9 g/dL (ref 13.0–17.0)
Immature Granulocytes: 0 %
Lymphocytes Relative: 31 %
Lymphs Abs: 2.1 10*3/uL (ref 0.7–4.0)
MCH: 32.4 pg (ref 26.0–34.0)
MCHC: 32 g/dL (ref 30.0–36.0)
MCV: 101.4 fL — ABNORMAL HIGH (ref 80.0–100.0)
Monocytes Absolute: 0.7 10*3/uL (ref 0.1–1.0)
Monocytes Relative: 10 %
Neutro Abs: 3.8 10*3/uL (ref 1.7–7.7)
Neutrophils Relative %: 56 %
Platelet Count: 226 10*3/uL (ref 150–400)
RBC: 4.29 MIL/uL (ref 4.22–5.81)
RDW: 13.2 % (ref 11.5–15.5)
WBC Count: 6.7 10*3/uL (ref 4.0–10.5)
nRBC: 0 % (ref 0.0–0.2)

## 2023-03-02 MED ORDER — LEUPROLIDE ACETATE (4 MONTH) 30 MG ~~LOC~~ KIT
30.0000 mg | PACK | Freq: Once | SUBCUTANEOUS | Status: AC
  Administered 2023-03-02: 30 mg via SUBCUTANEOUS
  Filled 2023-03-02: qty 30

## 2023-03-02 NOTE — Progress Notes (Signed)
[facility] Telephone:([phone]) [phone]   [phone]  PROGRESS NOTE  Patient Care Team: Sherwood Gambler, MD as PCP - General (Internal Medicine)  Hematological/Oncological History # Advanced Castrate Sensitive Prostate Cancer  11/25/2004 : diagnosed with Prostate cancer, underwent retropubic prostatectomy and bilateral lymph node dissection. The pathology showed a Gleason score of 4+4 equals 8 with staging of T2b N0.  3-12/2005: salvage radiation therapy for a rise in his PSA to 0.29. He received total of 65 gray in 35 fractions.  2016: castration-sensitive with biochemical relapse. Gleason score 4+3 equal 7 and a PSA of 3.66 at the time  06/17/2022: last visit with Dr. Clelia Croft.  10/28/2022: transfer care to Dr. Leonides Schanz   Interval History:  Richard [name] 77 y.o. male with medical history significant for castrate sensitive prostate cancer who presents for a follow up visit. The patient's last visit was on 10/28/2022. In the interim since the last visit he has had no major changes in his health.  On exam today Richard [name] reports he has been doing well in the interim since her last visit.  He notes he is little bit anxious because he does not have the results back of his PSA yet.  He notes he usually likes to get his PSA prior to the clinic visit.  He reports that he tolerates Lupron shots well without any difficulty.  He gets them in his buttocks.  He reports that his energy levels are "fair".  He reports about a 6.5 out of 10.  He has been eating well with no recent infectious symptoms.  He denies any runny nose, sore throat, or cough.  He is not having any issues with hot flashes or sweats.  He has lost some weight, is down to 201 pounds from 210 pounds.  Otherwise he is at his baseline level of health and is willing and able to proceed with continued Lupron shots at this time.  He denies any fevers, chills, sweats, nausea, vomiting or diarrhea.  He is has no new bone or back  pain.  A full 10 point ROS was otherwise negative.  MEDICAL HISTORY:  Past Medical History:  Diagnosis Date   Diabetes mellitus without complication (HCC)    Hypertension    prostate ca dx'd 2006   prostatectomy and xrt    SURGICAL HISTORY: No past surgical history on file.  SOCIAL HISTORY: Social History   Socioeconomic History   Marital status: Married    Spouse name: Not on file   Number of children: Not on file   Years of education: Not on file   Highest education level: Not on file  Occupational History   Not on file  Tobacco Use   Smoking status: Every Day   Smokeless tobacco: Never  Substance and Sexual Activity   Alcohol use: Not on file   Drug use: Not on file   Sexual activity: Not on file  Other Topics Concern   Not on file  Social History Narrative   Not on file   Social Determinants of Health   Financial Resource Strain: Not on file  Food Insecurity: Not on file  Transportation Needs: Not on file  Physical Activity: Not on file  Stress: Not on file  Social Connections: Not on file  Intimate Partner Violence: Not on file    FAMILY HISTORY: Family History  Problem Relation Age of Onset   Hypertension Father    Heart disease Neg Hx    Diabetes Neg Hx  ALLERGIES:  is allergic to effexor xr [venlafaxine hcl].  MEDICATIONS:  Current Outpatient Medications  Medication Sig Dispense Refill   amLODipine (NORVASC) 10 MG tablet Take 1 tablet (10 mg total) by mouth daily. (Patient taking differently: Take 10 mg by mouth daily. Take 1/2 tablet daily) 90 tablet 2   atorvastatin (LIPITOR) 10 MG tablet TAKE 1 TABLET(10 MG) BY MOUTH DAILY 90 tablet 0   Blood Glucose Monitoring Suppl (ACCU-CHEK GUIDE) w/Device KIT Use to check blood sugars daily 1 kit 0   empagliflozin (JARDIANCE) 10 MG TABS tablet Take 1 tablet (10 mg total) by mouth daily with breakfast. 30 tablet 3   glucose blood (ACCU-CHEK GUIDE) test strip Use to check blood sugars daily 100 each 2    hydrALAZINE (APRESOLINE) 25 MG tablet TAKE ONE TABLET BY MOUTH TWICE A DAY FOR BLOOD PRESSURE     Leuprolide Acetate (LUPRON DEPOT, 32-MONTH, IM) Inject into the muscle.     metFORMIN (GLUCOPHAGE-XR) 500 MG 24 hr tablet TAKE TWO TABLETS BY MOUTH TWICE A DAY FOR DIABETES (ANNUAL KIDNEY FUNCTION TESTING IS NEEDED)     MYRBETRIQ 50 MG TB24 tablet Take 1 tablet by mouth daily.     pioglitazone (ACTOS) 30 MG tablet TAKE 1 TABLET(30 MG) BY MOUTH DAILY 90 tablet 0   vitamin B-12 (CYANOCOBALAMIN) 500 MCG tablet Take 2 tablets by mouth daily.     No current facility-administered medications for this visit.    REVIEW OF SYSTEMS:   Constitutional: ( - ) fevers, ( - )  chills , ( - ) night sweats Eyes: ( - ) blurriness of vision, ( - ) double vision, ( - ) watery eyes Ears, nose, mouth, throat, and face: ( - ) mucositis, ( - ) sore throat Respiratory: ( - ) cough, ( - ) dyspnea, ( - ) wheezes Cardiovascular: ( - ) palpitation, ( - ) chest discomfort, ( - ) lower extremity swelling Gastrointestinal:  ( - ) nausea, ( - ) heartburn, ( - ) change in bowel habits Skin: ( - ) abnormal skin rashes Lymphatics: ( - ) new lymphadenopathy, ( - ) easy bruising Neurological: ( - ) numbness, ( - ) tingling, ( - ) new weaknesses Behavioral/Psych: ( - ) mood change, ( - ) new changes  All other systems were reviewed with the patient and are negative.  PHYSICAL EXAMINATION: ECOG PERFORMANCE STATUS: 1 - Symptomatic but completely ambulatory  Vitals:   03/02/23 1011  BP: 138/82  Pulse: 76  Resp: 16  Temp: 97.7 F (36.5 C)  SpO2: 97%    Filed Weights   03/02/23 1011  Weight: 201 lb 4.8 oz (91.3 kg)     GENERAL: Well-appearing elderly Caucasian male, alert, no distress and comfortable SKIN: skin color, texture, turgor are normal, no rashes or significant lesions EYES: conjunctiva are pink and non-injected, sclera clear LUNGS: clear to auscultation and percussion with normal breathing effort HEART:  regular rate & rhythm and no murmurs and no lower extremity edema Musculoskeletal: no cyanosis of digits and no clubbing  PSYCH: alert & oriented x 3, fluent speech NEURO: no focal motor/sensory deficits  LABORATORY DATA:  I have reviewed the data as listed    Latest Ref Rng & Units 03/02/2023    9:42 AM 10/21/2022    8:54 AM 07/27/2022    3:45 PM  CBC  WBC 4.0 - 10.5 K/uL 6.7  6.6  9.5   Hemoglobin 13.0 - 17.0 g/dL 40.9  81.1  91.4  Hematocrit 39.0 - 52.0 % 43.5  38.5  32.4   Platelets 150 - 400 K/uL 226  228  337        Latest Ref Rng & Units 03/02/2023    9:42 AM 01/10/2023    9:36 AM 10/25/2022    8:36 AM  CMP  Glucose 70 - 99 mg/dL 161  096  045   BUN 8 - 23 mg/dL 22  25  31    Creatinine 0.61 - 1.24 mg/dL 4.09  8.11  9.14   Sodium 135 - 145 mmol/L 139  138  139   Potassium 3.5 - 5.1 mmol/L 4.9  4.8  5.3 No hemolysis seen   Chloride 98 - 111 mmol/L 103  103  103   CO2 22 - 32 mmol/L 30  28  30    Calcium 8.9 - 10.3 mg/dL 9.8  9.1  9.6   Total Protein 6.5 - 8.1 g/dL 6.4     Total Bilirubin 0.3 - 1.2 mg/dL 0.5     Alkaline Phos 38 - 126 U/L 60     AST 15 - 41 U/L 16     ALT 0 - 44 U/L 11      RADIOGRAPHIC STUDIES: No results found.  ASSESSMENT & PLAN Richard [name] 77 y.o. male with medical history significant for castrate sensitive prostate cancer who presents for a follow up visit.   # Advanced Castrate Sensitive Prostate Cancer  -- continue eligard 30 mg q 4 months Subq -- PSA at last check on 10/21/2022 was <0.1 -- labs show white blood cell 6.7, hemoglobin 13.9, MCV one 1.4, and platelets of 226. Cr 1.51.  --No clear indication for imaging at this time. -- RTC in 4 months for repeat clinic visit and continued eligard.   No orders of the defined types were placed in this encounter.   All questions were answered. The patient knows to call the clinic with any problems, questions or concerns.  A total of more than 30 minutes were spent on this encounter with  face-to-face time and non-face-to-face time, including preparing to see the patient, ordering tests and/or medications, counseling the patient and coordination of care as outlined above.   Ulysees Barns, MD Department of Hematology/Oncology [facility] Phone: [phone] Pager: [phone] Email: [email or web address]  03/02/2023 5:05 PM

## 2023-03-04 LAB — TESTOSTERONE: Testosterone: <3 ng/dL — ABNORMAL LOW (ref 264–916)

## 2023-03-04 LAB — PROSTATE-SPECIFIC AG, SERUM (LABCORP): Prostate Specific Ag, Serum: <0.1 ng/mL (ref 0.0–4.0)

## 2023-03-07 ENCOUNTER — Telehealth: Payer: Self-pay | Admitting: *Deleted

## 2023-03-07 NOTE — Telephone Encounter (Signed)
-----   Message from Jaci Standard, MD sent at 03/06/2023 10:00 AM EDT ----- Please let Richard [name] know that his PSA remains undetectable <0.1 ----- Message ----- From: Interface, Lab In [place] Sent: 03/02/2023   9:59 AM EDT To: Jaci Standard, MD

## 2023-03-07 NOTE — Telephone Encounter (Signed)
TCT patient regarding recent lab results. No answer but able to leave vm on identified phone vm. Advised that his PSA is <0.1-essentially undetectable. Advised to call back @ [phone] with any questions or concerns.

## 2023-05-16 ENCOUNTER — Other Ambulatory Visit (INDEPENDENT_AMBULATORY_CARE_PROVIDER_SITE_OTHER): Payer: Medicare PPO

## 2023-05-16 DIAGNOSIS — E119 Type 2 diabetes mellitus without complications: Secondary | ICD-10-CM | POA: Diagnosis not present

## 2023-05-16 LAB — HEMOGLOBIN A1C: Hgb A1c MFr Bld: 6.4 % (ref 4.6–6.5)

## 2023-05-16 LAB — BASIC METABOLIC PANEL
BUN: 24 mg/dL — ABNORMAL HIGH (ref 6–23)
CO2: 29 mEq/L (ref 19–32)
Calcium: 9.4 mg/dL (ref 8.4–10.5)
Chloride: 103 meq/L (ref 96–112)
Creatinine, Ser: 1.42 mg/dL (ref 0.40–1.50)
GFR: 47.39 mL/min — ABNORMAL LOW (ref >60.00)
Glucose, Bld: 112 mg/dL — ABNORMAL HIGH (ref 70–99)
Potassium: 4.7 meq/L (ref 3.5–5.1)
Sodium: 139 mEq/L (ref 135–145)

## 2023-05-18 ENCOUNTER — Encounter: Payer: Self-pay | Admitting: Endocrinology

## 2023-05-18 ENCOUNTER — Ambulatory Visit: Payer: Medicare PPO | Admitting: Endocrinology

## 2023-05-18 VITALS — BP 130/70 | HR 69 | Ht 70.0 in | Wt 201.0 lb

## 2023-05-18 DIAGNOSIS — Z7984 Long term (current) use of oral hypoglycemic drugs: Secondary | ICD-10-CM

## 2023-05-18 DIAGNOSIS — N1831 Chronic kidney disease, stage 3a: Secondary | ICD-10-CM

## 2023-05-18 DIAGNOSIS — E118 Type 2 diabetes mellitus with unspecified complications: Secondary | ICD-10-CM

## 2023-05-18 DIAGNOSIS — E1122 Type 2 diabetes mellitus with diabetic chronic kidney disease: Secondary | ICD-10-CM

## 2023-05-18 DIAGNOSIS — E7800 Pure hypercholesterolemia, unspecified: Secondary | ICD-10-CM

## 2023-05-18 MED ORDER — LOSARTAN POTASSIUM 25 MG PO TABS: 25.0000 mg | ORAL_TABLET | Freq: Every day | ORAL | 3 refills | Status: AC

## 2023-05-18 MED ORDER — LOSARTAN POTASSIUM 25 MG PO TABS: 25.0000 mg | ORAL_TABLET | Freq: Every day | ORAL | 3 refills | Status: DC

## 2023-05-18 MED ORDER — METFORMIN HCL ER 500 MG PO TB24: ORAL_TABLET | ORAL | 3 refills | Status: DC

## 2023-05-18 NOTE — Patient Instructions (Addendum)
Diabetes regimen:  Decrease metformin to 1 tab in the morning and 2 tabs in the evening.   Jardiance and actos same dose.   Restart losartan 25mg  daily.   Lab in 2 weeks for renal function and potassium.

## 2023-05-18 NOTE — Progress Notes (Signed)
Outpatient Endocrinology Note Richard Keani Gotcher, MD  05/18/23  Patient's Name: Richard [name]    DOB: 1945/11/02    MRN: [id number]                                                    REASON OF VISIT: Follow up for type 2 diabetes mellitus  PCP: Sherwood Gambler, MD  HISTORY OF PRESENT ILLNESS:   Richard [name] is a 77 y.o. old male with past medical history listed below, is here for follow up of type 2 diabetes mellitus.  Patient was last time seen by Dr. Lucianne Muss in May 2024.  Pertinent Diabetes History: Patient was diagnosed with type 2 diabetes mellitus in 1995.  Hemoglobin A1c at that time was 9.6%.  He had been on various antidiabetic medications orals in the past.  He has generally well-controlled diabetes mellitus.    Chronic Diabetes Complications : Retinopathy: no. Last ophthalmology exam was done on annually, reportedly at [place]. Nephropathy: yes, CKD IIIa, microalbuminuria.  Used to be on losartan 100 mg daily stopped due to hyperkalemia. Peripheral neuropathy: no Coronary artery disease: no Stroke: no  Relevant comorbidities and cardiovascular risk factors: Obesity: no Body mass index is 28.84 kg/m.  Hypertension: yes Hyperlipidemia. Yes, on statin.   Current / Home Diabetic regimen includes: Metformin extended release 1000 mg 2 times a day. Pioglitazone/Actos 30 mg daily. Jardiance 10 mg daily.  Prior diabetic medications: Amaryl, caused hypoglycemia.  Glycemic data:    Richard [name] glucose meter is computer downloaded. Raw data and trends analyzed.  He has been checking blood sugar occasionally about 0.2 times per day.  Blood sugar reviewed from June 7 to April 04, 2023 last 60 days he had checked 9 times.  Average blood sugar 126.  Some of the blood sugar 114, 117, 142, 124, 135, 149, 101.  He has been checking mostly in the morning fasting.  Hypoglycemia: Patient has no hypoglycemic episodes. Patient has hypoglycemia awareness.  Factors modifying glucose control: 1.   Diabetic diet assessment: 3 meals a day however lighter meals.  Mostly lunch with the largest meal of the day.  2.  Staying active or exercising: No formal exercise.  3.  Medication compliance: compliant all of the time.  # History of vitamin B12 deficiency on vitamin D B12 supplement. # History of prostate cancer status post prostatectomy and radiation treatment, on Lupron.  Interval history 05/18/23 Glucometer data as reviewed above.  Hemoglobin A1c recently 6.4%.  He has been taking metformin, Jardiance and Actos.  No side effect from medication.  Tolerating well.  No other complaints today.  REVIEW OF SYSTEMS As per history of present illness.   PAST MEDICAL HISTORY: Past Medical History:  Diagnosis Date   Diabetes mellitus without complication (HCC)    Hypertension    prostate ca dx'd 2006   prostatectomy and xrt    PAST SURGICAL HISTORY: History reviewed. No pertinent surgical history.  ALLERGIES: Allergies  Allergen Reactions   Effexor Xr [Venlafaxine Hcl] Rash    FAMILY HISTORY:  Family History  Problem Relation Age of Onset   Hypertension Father    Heart disease Neg Hx    Diabetes Neg Hx     SOCIAL HISTORY: Social History   Socioeconomic History   Marital status: Married    Spouse name: Not on file  Number of children: Not on file   Years of education: Not on file   Highest education level: Not on file  Occupational History   Not on file  Tobacco Use   Smoking status: Every Day   Smokeless tobacco: Never  Substance and Sexual Activity   Alcohol use: Not on file   Drug use: Not on file   Sexual activity: Not on file  Other Topics Concern   Not on file  Social History Narrative   Not on file   Social Determinants of Health   Financial Resource Strain: Not on file  Food Insecurity: Not on file  Transportation Needs: Not on file  Physical Activity: Not on file  Stress: Not on file  Social Connections: Not on file    MEDICATIONS:   Current Outpatient Medications  Medication Sig Dispense Refill   amLODipine (NORVASC) 10 MG tablet Take 1 tablet (10 mg total) by mouth daily. (Patient taking differently: Take 10 mg by mouth daily. Take 1/2 tablet daily) 90 tablet 2   atorvastatin (LIPITOR) 10 MG tablet TAKE 1 TABLET(10 MG) BY MOUTH DAILY 90 tablet 0   Blood Glucose Monitoring Suppl (ACCU-CHEK GUIDE) w/Device KIT Use to check blood sugars daily 1 kit 0   empagliflozin (JARDIANCE) 10 MG TABS tablet Take 1 tablet (10 mg total) by mouth daily with breakfast. 30 tablet 3   glucose blood (ACCU-CHEK GUIDE) test strip Use to check blood sugars daily 100 each 2   hydrALAZINE (APRESOLINE) 25 MG tablet TAKE ONE TABLET BY MOUTH TWICE A DAY FOR BLOOD PRESSURE     Leuprolide Acetate (LUPRON DEPOT, 71-MONTH, IM) Inject into the muscle.     MYRBETRIQ 50 MG TB24 tablet Take 1 tablet by mouth daily.     pioglitazone (ACTOS) 30 MG tablet TAKE 1 TABLET(30 MG) BY MOUTH DAILY 90 tablet 0   vitamin B-12 (CYANOCOBALAMIN) 500 MCG tablet Take 2 tablets by mouth daily.     losartan (COZAAR) 25 MG tablet Take 1 tablet (25 mg total) by mouth daily. 90 tablet 3   metFORMIN (GLUCOPHAGE-XR) 500 MG 24 hr tablet Take 1 tab in the morning and 2 tabs in the evening. 135 tablet 3   No current facility-administered medications for this visit.    PHYSICAL EXAM: Vitals:   05/18/23 0807  BP: 130/70  Pulse: 69  SpO2: 97%  Weight: 201 lb (91.2 kg)  Height: 5\' 10"  (1.778 m)   Body mass index is 28.84 kg/m.  Wt Readings from Last 3 Encounters:  05/18/23 201 lb (91.2 kg)  03/02/23 201 lb 4.8 oz (91.3 kg)  01/12/23 205 lb (93 kg)    General: Well developed, well nourished male in no apparent distress.  HEENT: AT/[place], no external lesions.  Eyes: Conjunctiva clear and no icterus. Neck: Neck supple  Lungs: Respirations not labored Neurologic: Alert, oriented, normal speech Extremities / Skin: Dry. No sores or rashes noted.  Psychiatric: Does not appear  depressed or anxious  Diabetic Foot Exam - Simple   No data filed    LABS Reviewed Lab Results  Component Value Date   HGBA1C 6.4 05/16/2023   HGBA1C 6.5 01/10/2023   HGBA1C 6.6 (H) 10/25/2022   Lab Results  Component Value Date   FRUCTOSAMINE 228 01/30/2020   Lab Results  Component Value Date   CHOL 171 08/17/2022   HDL 76.70 08/17/2022   LDLCALC 68 08/17/2022   LDLDIRECT 84.0 03/30/2021   TRIG 129.0 08/17/2022   CHOLHDL 2 08/17/2022  Lab Results  Component Value Date   MICRALBCREAT 12.0 08/17/2022   MICRALBCREAT 4.6 09/28/2021   Lab Results  Component Value Date   CREATININE 1.42 05/16/2023   Lab Results  Component Value Date   GFR 47.39 (L) 05/16/2023    ASSESSMENT / PLAN  1. Controlled type 2 diabetes mellitus with complication, without long-term current use of insulin (HCC)   2. Stage 3a chronic kidney disease (HCC)   3. Hypercholesterolemia     Diabetes Mellitus type 2, complicated by CKD/microalbuminuria. - Diabetic status / severity: Controlled  Lab Results  Component Value Date   HGBA1C 6.4 05/16/2023    - Hemoglobin A1c goal : <7%  Due to CKD and EGFR in upper 40s I would like to decrease the dose of metformin.  If needed will consider to increase dose of Jardiance in the future.  - Medications: See below  I) decrease metformin 500 mg extended release 1 tablet in the morning and 2 tablets in the evening. II) continue Jardiance 10 mg daily. III) continue pioglitazone/Actos 30 mg daily.  - Home glucose testing: In the morning fasting daily. - Discussed/ Gave Hypoglycemia treatment plan.  # Consult : not required at this time.   # Annual urine for microalbuminuria/ creatinine ratio, has microalbuminuria currently, currently not on ACE/ARB.  Will recheck urine microalbumin creatinine ratio in 3 months prior to follow-up visit. Used to be on losartan 100 mg daily, he stopped due to hyperkalemia in November 2023. Will restart losartan 25 mg  daily. Check BMP in 2 weeks to monitor potassium and renal function. Consider referral to nephrology if renal function declines in the future visits.  Last  Lab Results  Component Value Date   MICRALBCREAT 12.0 08/17/2022    # Foot check nightly / neuropathy.  # Annual dilated diabetic eye exams.   - Diet: Make healthy diabetic food choices - Life style / activity / exercise: Discussed.  2. Blood pressure  -  BP Readings from Last 1 Encounters:  05/18/23 130/70    - Control is in target.  - No change in current plans. -Restart of losartan, patient is advised to monitor blood pressure at home and asked to call our clinic or primary care provider if there is low blood pressure, may require to adjust oral antihypertensive medication at that time.  3. Lipid status / Hyperlipidemia - Last  Lab Results  Component Value Date   LDLCALC 68 08/17/2022   - Continue atorvastatin 10 mg daily. -Will check lipid panel in 3 months prior to follow-up visit.  Diagnoses and all orders for this visit:  Controlled type 2 diabetes mellitus with complication, without long-term current use of insulin (HCC) -     Basic metabolic panel; Future  Stage 3a chronic kidney disease (HCC)  Hypercholesterolemia  Other orders -     Discontinue: losartan (COZAAR) 25 MG tablet; Take 1 tablet (25 mg total) by mouth daily. -     losartan (COZAAR) 25 MG tablet; Take 1 tablet (25 mg total) by mouth daily. -     metFORMIN (GLUCOPHAGE-XR) 500 MG 24 hr tablet; Take 1 tab in the morning and 2 tabs in the evening.    DISPOSITION Follow up in clinic in 3 months suggested.  Lab visit in 2 weeks.  Will make plan for lab visit 1 week prior to follow-up visit in 3 months as well.   All questions answered and patient verbalized understanding of the plan.  Richard Reno Clasby, MD [facility] Endocrinology  [facility] [address], [address] [place], [place] [address] Phone # [phone]  At least part  of this note was generated using voice recognition software. Inadvertent word errors may have occurred, which were not recognized during the proofreading process.

## 2023-06-01 ENCOUNTER — Other Ambulatory Visit (INDEPENDENT_AMBULATORY_CARE_PROVIDER_SITE_OTHER): Payer: Medicare PPO

## 2023-06-01 DIAGNOSIS — E118 Type 2 diabetes mellitus with unspecified complications: Secondary | ICD-10-CM | POA: Diagnosis not present

## 2023-06-01 LAB — BASIC METABOLIC PANEL
BUN: 25 mg/dL — ABNORMAL HIGH (ref 6–23)
CO2: 28 meq/L (ref 19–32)
Calcium: 9.3 mg/dL (ref 8.4–10.5)
Chloride: 103 meq/L (ref 96–112)
Creatinine, Ser: 1.39 mg/dL (ref 0.40–1.50)
GFR: 48.6 mL/min — ABNORMAL LOW (ref >60.00)
Glucose, Bld: 120 mg/dL — ABNORMAL HIGH (ref 70–99)
Potassium: 4.6 meq/L (ref 3.5–5.1)
Sodium: 139 meq/L (ref 135–145)

## 2023-06-15 DIAGNOSIS — N3946 Mixed incontinence: Secondary | ICD-10-CM | POA: Diagnosis not present

## 2023-06-15 DIAGNOSIS — N3942 Incontinence without sensory awareness: Secondary | ICD-10-CM | POA: Diagnosis not present

## 2023-06-28 ENCOUNTER — Other Ambulatory Visit: Payer: Self-pay | Admitting: Hematology and Oncology

## 2023-06-28 DIAGNOSIS — C61 Malignant neoplasm of prostate: Secondary | ICD-10-CM

## 2023-06-29 ENCOUNTER — Inpatient Hospital Stay: Payer: Medicare PPO

## 2023-06-29 ENCOUNTER — Inpatient Hospital Stay: Payer: Medicare PPO | Attending: Hematology and Oncology

## 2023-06-29 ENCOUNTER — Inpatient Hospital Stay: Payer: Medicare PPO | Admitting: Hematology and Oncology

## 2023-06-29 DIAGNOSIS — C61 Malignant neoplasm of prostate: Secondary | ICD-10-CM | POA: Insufficient documentation

## 2023-06-29 LAB — CBC WITH DIFFERENTIAL (CANCER CENTER ONLY)
Abs Immature Granulocytes: 0.03 10*3/uL (ref 0.00–0.07)
Basophils Absolute: 0 10*3/uL (ref 0.0–0.1)
Basophils Relative: 1 %
Eosinophils Absolute: 0 10*3/uL (ref 0.0–0.5)
Eosinophils Relative: 0 %
HCT: 40.2 % (ref 39.0–52.0)
Hemoglobin: 13 g/dL (ref 13.0–17.0)
Immature Granulocytes: 0 %
Lymphocytes Relative: 24 %
Lymphs Abs: 1.6 10*3/uL (ref 0.7–4.0)
MCH: 32.4 pg (ref 26.0–34.0)
MCHC: 32.3 g/dL (ref 30.0–36.0)
MCV: 100.2 fL — ABNORMAL HIGH (ref 80.0–100.0)
Monocytes Absolute: 0.5 10*3/uL (ref 0.1–1.0)
Monocytes Relative: 7 %
Neutro Abs: 4.5 10*3/uL (ref 1.7–7.7)
Neutrophils Relative %: 68 %
Platelet Count: 219 10*3/uL (ref 150–400)
RBC: 4.01 MIL/uL — ABNORMAL LOW (ref 4.22–5.81)
RDW: 12.9 % (ref 11.5–15.5)
WBC Count: 6.7 10*3/uL (ref 4.0–10.5)
nRBC: 0 % (ref 0.0–0.2)

## 2023-06-29 LAB — CMP (CANCER CENTER ONLY)
ALT: 12 U/L (ref 0–44)
AST: 17 U/L (ref 15–41)
Albumin: 4.2 g/dL (ref 3.5–5.0)
Alkaline Phosphatase: 57 U/L (ref 38–126)
Anion gap: 6 (ref 5–15)
BUN: 26 mg/dL — ABNORMAL HIGH (ref 8–23)
CO2: 29 mmol/L (ref 22–32)
Calcium: 9.5 mg/dL (ref 8.9–10.3)
Chloride: 101 mmol/L (ref 98–111)
Creatinine: 1.61 mg/dL — ABNORMAL HIGH (ref 0.61–1.24)
GFR, Estimated: 44 mL/min — ABNORMAL LOW (ref >60)
Glucose, Bld: 212 mg/dL — ABNORMAL HIGH (ref 70–99)
Potassium: 5.1 mmol/L (ref 3.5–5.1)
Sodium: 136 mmol/L (ref 135–145)
Total Bilirubin: 0.5 mg/dL (ref 0.3–1.2)
Total Protein: 6.7 g/dL (ref 6.5–8.1)

## 2023-06-30 LAB — TESTOSTERONE: Testosterone: <3 ng/dL — ABNORMAL LOW (ref 264–916)

## 2023-07-01 LAB — PROSTATE-SPECIFIC AG, SERUM (LABCORP): Prostate Specific Ag, Serum: <0.1 ng/mL (ref 0.0–4.0)

## 2023-07-06 ENCOUNTER — Inpatient Hospital Stay: Payer: Medicare PPO

## 2023-07-06 ENCOUNTER — Inpatient Hospital Stay: Payer: Medicare PPO | Attending: Hematology and Oncology | Admitting: Hematology and Oncology

## 2023-07-06 VITALS — BP 153/69 | HR 63 | Temp 97.7°F | Resp 13 | Wt 200.7 lb

## 2023-07-06 DIAGNOSIS — C61 Malignant neoplasm of prostate: Secondary | ICD-10-CM | POA: Insufficient documentation

## 2023-07-06 DIAGNOSIS — Z8546 Personal history of malignant neoplasm of prostate: Secondary | ICD-10-CM

## 2023-07-06 DIAGNOSIS — Z5111 Encounter for antineoplastic chemotherapy: Secondary | ICD-10-CM | POA: Insufficient documentation

## 2023-07-06 MED ORDER — LEUPROLIDE ACETATE (4 MONTH) 30 MG ~~LOC~~ KIT
30.0000 mg | PACK | Freq: Once | SUBCUTANEOUS | Status: AC
Start: 2023-07-06 — End: 2023-07-06
  Administered 2023-07-06: 30 mg via SUBCUTANEOUS
  Filled 2023-07-06: qty 30

## 2023-07-06 NOTE — Progress Notes (Signed)
[facility] [phone]   [phone]  PROGRESS NOTE  Patient Care Team: Sherwood Gambler, MD as PCP - General (Internal Medicine)  Hematological/Oncological History # Advanced Castrate Sensitive Prostate Cancer  11/25/2004 : diagnosed with Prostate cancer, underwent retropubic prostatectomy and bilateral lymph node dissection. The pathology showed a Gleason score of 4+4 equals 8 with staging of T2b N0.  3-12/2005: salvage radiation therapy for a rise in his PSA to 0.29. He received total of 65 gray in 35 fractions.  2016: castration-sensitive with biochemical relapse. Gleason score 4+3 equal 7 and a PSA of 3.66 at the time  06/17/2022: last visit with Dr. Clelia Croft.  10/28/2022: transfer care to Dr. Leonides Schanz   Interval History:  Richard [name] 77 y.o. male with medical history significant for castrate sensitive prostate cancer who presents for a follow up visit. The patient's last visit was on 03/02/2023. In the interim since the last visit he has had no major changes in his health.  On exam today Richard [name] reports he has had no major changes in his health in the interim since her last visit.  He is had no hospitalizations, emergency room visits, or changes in medication.  He reports he is not having any trouble with his Eligard therapy.  He is not having any hot flashes or sweats.  His appetite is excellent though his energy levels are "so-so".  He reports he does have a urinary leakage problem for which he follows with urology.  He notes it did happen after the prostate surgery.  Overall he is at his baseline level of health and is willing and able to proceed with continued Lupron shots at this time.  He denies any fevers, chills, sweats, nausea, vomiting or diarrhea.  He is has no new bone or back pain.  A full 10 point ROS was otherwise negative.  MEDICAL HISTORY:  Past Medical History:  Diagnosis Date   Diabetes mellitus without complication (HCC)     Hypertension    prostate ca dx'd 2006   prostatectomy and xrt    SURGICAL HISTORY: No past surgical history on file.  SOCIAL HISTORY: Social History   Socioeconomic History   Marital status: Married    Spouse name: Not on file   Number of children: Not on file   Years of education: Not on file   Highest education level: Not on file  Occupational History   Not on file  Tobacco Use   Smoking status: Every Day   Smokeless tobacco: Never  Substance and Sexual Activity   Alcohol use: Not on file   Drug use: Not on file   Sexual activity: Not on file  Other Topics Concern   Not on file  Social History Narrative   Not on file   Social Determinants of Health   Financial Resource Strain: Not on file  Food Insecurity: Not on file  Transportation Needs: Not on file  Physical Activity: Not on file  Stress: Not on file  Social Connections: Not on file  Intimate Partner Violence: Not on file    FAMILY HISTORY: Family History  Problem Relation Age of Onset   Hypertension Father    Heart disease Neg Hx    Diabetes Neg Hx     ALLERGIES:  is allergic to effexor xr [venlafaxine hcl].  MEDICATIONS:  Current Outpatient Medications  Medication Sig Dispense Refill   amLODipine (NORVASC) 10 MG tablet Take 1 tablet (10 mg total) by mouth daily. (Patient taking  differently: Take 10 mg by mouth daily. Take 1/2 tablet daily) 90 tablet 2   atorvastatin (LIPITOR) 10 MG tablet TAKE 1 TABLET(10 MG) BY MOUTH DAILY 90 tablet 0   Blood Glucose Monitoring Suppl (ACCU-CHEK GUIDE) w/Device KIT Use to check blood sugars daily 1 kit 0   empagliflozin (JARDIANCE) 10 MG TABS tablet Take 1 tablet (10 mg total) by mouth daily with breakfast. 30 tablet 3   glucose blood (ACCU-CHEK GUIDE) test strip Use to check blood sugars daily 100 each 2   hydrALAZINE (APRESOLINE) 25 MG tablet TAKE ONE TABLET BY MOUTH TWICE A DAY FOR BLOOD PRESSURE     Leuprolide Acetate (LUPRON DEPOT, 10-MONTH, IM) Inject into the  muscle.     losartan (COZAAR) 25 MG tablet Take 1 tablet (25 mg total) by mouth daily. 90 tablet 3   metFORMIN (GLUCOPHAGE-XR) 500 MG 24 hr tablet Take 1 tab in the morning and 2 tabs in the evening. 135 tablet 3   MYRBETRIQ 50 MG TB24 tablet Take 1 tablet by mouth daily.     pioglitazone (ACTOS) 30 MG tablet TAKE 1 TABLET(30 MG) BY MOUTH DAILY 90 tablet 0   vitamin B-12 (CYANOCOBALAMIN) 500 MCG tablet Take 2 tablets by mouth daily.     No current facility-administered medications for this visit.    REVIEW OF SYSTEMS:   Constitutional: ( - ) fevers, ( - )  chills , ( - ) night sweats Eyes: ( - ) blurriness of vision, ( - ) double vision, ( - ) watery eyes Ears, nose, mouth, throat, and face: ( - ) mucositis, ( - ) sore throat Respiratory: ( - ) cough, ( - ) dyspnea, ( - ) wheezes Cardiovascular: ( - ) palpitation, ( - ) chest discomfort, ( - ) lower extremity swelling Gastrointestinal:  ( - ) nausea, ( - ) heartburn, ( - ) change in bowel habits Skin: ( - ) abnormal skin rashes Lymphatics: ( - ) new lymphadenopathy, ( - ) easy bruising Neurological: ( - ) numbness, ( - ) tingling, ( - ) new weaknesses Behavioral/Psych: ( - ) mood change, ( - ) new changes  All other systems were reviewed with the patient and are negative.  PHYSICAL EXAMINATION: ECOG PERFORMANCE STATUS: 1 - Symptomatic but completely ambulatory  Vitals:   07/06/23 0956  BP: (!) 153/69  Pulse: 63  Resp: 13  Temp: 97.7 F (36.5 C)  SpO2: 97%     Filed Weights   07/06/23 0956  Weight: 200 lb 11.2 oz (91 kg)      GENERAL: Well-appearing elderly Caucasian male, alert, no distress and comfortable SKIN: skin color, texture, turgor are normal, no rashes or significant lesions EYES: conjunctiva are pink and non-injected, sclera clear LUNGS: clear to auscultation and percussion with normal breathing effort HEART: regular rate & rhythm and no murmurs and no lower extremity edema Musculoskeletal: no cyanosis of  digits and no clubbing  PSYCH: alert & oriented x 3, fluent speech NEURO: no focal motor/sensory deficits  LABORATORY DATA:  I have reviewed the data as listed    Latest Ref Rng & Units 06/29/2023    2:07 PM 03/02/2023    9:42 AM 10/21/2022    8:54 AM  CBC  WBC 4.0 - 10.5 K/uL 6.7  6.7  6.6   Hemoglobin 13.0 - 17.0 g/dL 29.5  62.1  30.8   Hematocrit 39.0 - 52.0 % 40.2  43.5  38.5   Platelets 150 - 400 K/uL 219  226  228        Latest Ref Rng & Units 06/29/2023    2:07 PM 06/01/2023    8:22 AM 05/16/2023    8:26 AM  CMP  Glucose 70 - 99 mg/dL 098  119  147   BUN 8 - 23 mg/dL 26  25  24    Creatinine 0.61 - 1.24 mg/dL 8.29  5.62  1.30   Sodium 135 - 145 mmol/L 136  139  139   Potassium 3.5 - 5.1 mmol/L 5.1  4.6  4.7   Chloride 98 - 111 mmol/L 101  103  103   CO2 22 - 32 mmol/L 29  28  29    Calcium 8.9 - 10.3 mg/dL 9.5  9.3  9.4   Total Protein 6.5 - 8.1 g/dL 6.7     Total Bilirubin 0.3 - 1.2 mg/dL 0.5     Alkaline Phos 38 - 126 U/L 57     AST 15 - 41 U/L 17     ALT 0 - 44 U/L 12      RADIOGRAPHIC STUDIES: No results found.  ASSESSMENT & PLAN Richard [name] 77 y.o. male with medical history significant for castrate sensitive prostate cancer who presents for a follow up visit.   # Advanced Castrate Sensitive Prostate Cancer  -- continue eligard 30 mg q 4 months Subq -- PSA at last check on 06/29/2023 was <0.1, testosterone <3 -- labs show white blood cell 6.7, Hgb 13.0, MCV 100.2, Plt 219 --Modest elevation in creatinine to 1.16, recommend hydration.  Will continue to monitor. --No clear indication for imaging at this time. -- RTC in 4 months for repeat clinic visit and continued eligard.   No orders of the defined types were placed in this encounter.   All questions were answered. The patient knows to call the clinic with any problems, questions or concerns.  A total of more than 30 minutes were spent on this encounter with face-to-face time and non-face-to-face time,  including preparing to see the patient, ordering tests and/or medications, counseling the patient and coordination of care as outlined above.   Ulysees Barns, MD Department of Hematology/Oncology [facility] Phone: [phone] Pager: [phone] Email: [email or web address]  07/06/2023 3:03 PM

## 2023-08-17 DIAGNOSIS — M25561 Pain in right knee: Secondary | ICD-10-CM | POA: Diagnosis not present

## 2023-09-01 DIAGNOSIS — K353 Acute appendicitis with localized peritonitis, without perforation or gangrene: Secondary | ICD-10-CM | POA: Diagnosis not present

## 2023-09-01 DIAGNOSIS — Z7984 Long term (current) use of oral hypoglycemic drugs: Secondary | ICD-10-CM | POA: Diagnosis not present

## 2023-09-01 DIAGNOSIS — E119 Type 2 diabetes mellitus without complications: Secondary | ICD-10-CM | POA: Diagnosis not present

## 2023-09-01 DIAGNOSIS — K3532 Acute appendicitis with perforation and localized peritonitis, without abscess: Secondary | ICD-10-CM | POA: Diagnosis not present

## 2023-09-01 DIAGNOSIS — I10 Essential (primary) hypertension: Secondary | ICD-10-CM | POA: Diagnosis not present

## 2023-09-01 DIAGNOSIS — K37 Unspecified appendicitis: Secondary | ICD-10-CM | POA: Diagnosis not present

## 2023-09-01 DIAGNOSIS — K660 Peritoneal adhesions (postprocedural) (postinfection): Secondary | ICD-10-CM | POA: Diagnosis not present

## 2023-09-01 DIAGNOSIS — Z79899 Other long term (current) drug therapy: Secondary | ICD-10-CM | POA: Diagnosis not present

## 2023-09-01 DIAGNOSIS — C61 Malignant neoplasm of prostate: Secondary | ICD-10-CM | POA: Diagnosis not present

## 2023-09-01 DIAGNOSIS — Z9079 Acquired absence of other genital organ(s): Secondary | ICD-10-CM | POA: Diagnosis not present

## 2023-09-01 DIAGNOSIS — K3580 Unspecified acute appendicitis: Secondary | ICD-10-CM | POA: Diagnosis not present

## 2023-09-01 DIAGNOSIS — F17200 Nicotine dependence, unspecified, uncomplicated: Secondary | ICD-10-CM | POA: Diagnosis not present

## 2023-09-05 ENCOUNTER — Ambulatory Visit: Payer: Medicare PPO | Admitting: Endocrinology

## 2023-09-14 ENCOUNTER — Telehealth: Payer: Self-pay

## 2023-09-15 ENCOUNTER — Other Ambulatory Visit: Payer: Self-pay | Admitting: Endocrinology

## 2023-09-15 DIAGNOSIS — Z09 Encounter for follow-up examination after completed treatment for conditions other than malignant neoplasm: Secondary | ICD-10-CM | POA: Diagnosis not present

## 2023-09-15 DIAGNOSIS — Z1330 Encounter for screening examination for mental health and behavioral disorders, unspecified: Secondary | ICD-10-CM | POA: Diagnosis not present

## 2023-09-15 DIAGNOSIS — E118 Type 2 diabetes mellitus with unspecified complications: Secondary | ICD-10-CM

## 2023-09-15 DIAGNOSIS — E7800 Pure hypercholesterolemia, unspecified: Secondary | ICD-10-CM

## 2023-09-15 LAB — MICROALBUMIN / CREATININE URINE RATIO
A1c: 6.5
Albumin, Urine POC: 0.8
Creatinine, POC: 29.9 mg/dL
EGFR: 51
Microalb Creat Ratio: 26.8
TSH: 1.2 (ref 0.41–5.90)

## 2023-09-22 ENCOUNTER — Other Ambulatory Visit: Payer: Medicare PPO

## 2023-09-22 DIAGNOSIS — E7800 Pure hypercholesterolemia, unspecified: Secondary | ICD-10-CM | POA: Diagnosis not present

## 2023-09-22 DIAGNOSIS — E118 Type 2 diabetes mellitus with unspecified complications: Secondary | ICD-10-CM | POA: Diagnosis not present

## 2023-09-23 ENCOUNTER — Encounter: Payer: Self-pay | Admitting: Endocrinology

## 2023-09-23 LAB — LIPID PANEL
Cholesterol: 187 mg/dL (ref <200)
HDL: 78 mg/dL (ref >=40)
LDL Cholesterol (Calc): 89 mg/dL
Non-HDL Cholesterol (Calc): 109 mg/dL (ref <130)
Total CHOL/HDL Ratio: 2.4 (calc) (ref <5.0)
Triglycerides: 105 mg/dL (ref <150)

## 2023-09-23 LAB — BASIC METABOLIC PANEL WITH GFR
BUN/Creatinine Ratio: 17 (calc) (ref 6–22)
BUN: 25 mg/dL (ref 7–25)
CO2: 27 mmol/L (ref 20–32)
Calcium: 9.8 mg/dL (ref 8.6–10.3)
Chloride: 104 mmol/L (ref 98–110)
Creat: 1.48 mg/dL — ABNORMAL HIGH (ref 0.70–1.28)
Glucose, Bld: 113 mg/dL — ABNORMAL HIGH (ref 65–99)
Potassium: 5.3 mmol/L (ref 3.5–5.3)
Sodium: 140 mmol/L (ref 135–146)
eGFR: 48 mL/min/{1.73_m2} — ABNORMAL LOW (ref >=60)

## 2023-09-23 LAB — HEMOGLOBIN A1C
Hgb A1c MFr Bld: 6.4 %{Hb} — ABNORMAL HIGH (ref <5.7)
Mean Plasma Glucose: 137 mg/dL
eAG (mmol/L): 7.6 mmol/L

## 2023-09-30 ENCOUNTER — Encounter: Payer: Self-pay | Admitting: Endocrinology

## 2023-09-30 ENCOUNTER — Ambulatory Visit: Payer: Medicare PPO | Admitting: Endocrinology

## 2023-09-30 VITALS — BP 138/80 | HR 97 | Resp 20 | Ht 70.0 in | Wt 201.0 lb

## 2023-09-30 DIAGNOSIS — E118 Type 2 diabetes mellitus with unspecified complications: Secondary | ICD-10-CM

## 2023-09-30 DIAGNOSIS — Z7984 Long term (current) use of oral hypoglycemic drugs: Secondary | ICD-10-CM

## 2023-09-30 NOTE — Progress Notes (Signed)
Outpatient Endocrinology Note [place] Jourdyn Ferrin, MD  09/30/23  Patient's Name: Richard [name]    DOB: Jan 22, 1946    MRN: [id number]                                                    REASON OF VISIT: Follow up for type 2 diabetes mellitus  PCP: Sherwood Gambler, MD  HISTORY OF PRESENT ILLNESS:   Richard [name] is a 78 y.o. old male with past medical history listed below, is here for follow up of type 2 diabetes mellitus.    Pertinent Diabetes History: Patient was diagnosed with type 2 diabetes mellitus in 1995.  Hemoglobin A1c at that time was 9.6%.  He had been on various antidiabetic medications orals in the past.  He has generally well-controlled diabetes mellitus.    Chronic Diabetes Complications : Retinopathy: no. Last ophthalmology exam was done on annually, reportedly at [place]. Nephropathy: yes, CKD IIIa, microalbuminuria.  Used to be on losartan 100 mg daily stopped due to hyperkalemia. Peripheral neuropathy: no Coronary artery disease: no Stroke: no  Relevant comorbidities and cardiovascular risk factors: Obesity: no Body mass index is 28.84 kg/m.  Hypertension: yes Hyperlipidemia. Yes, on statin.   Current / Home Diabetic regimen includes: Metformin extended release 500 mg in the morning and 1000 mg in the evening.   Pioglitazone/Actos 30 mg daily. Jardiance 10 mg daily.  Prior diabetic medications: Amaryl, caused hypoglycemia.  Glycemic data:   Freestyle libre [employer] from November 29 to September 26, 2023 for last 60 days reviewed.  Average blood sugar 123.  Some of the fasting blood sugar 109, 165, 105, 119, 123.  Blood sugar around bedtime 146, 163, 101.  Hypo and hyperglycemia.  Hypoglycemia: Patient has no hypoglycemic episodes. Patient has hypoglycemia awareness.  Factors modifying glucose control: 1.  Diabetic diet assessment: 3 meals a day however lighter meals.  Mostly lunch with the largest meal of the day.  2.  Staying active or exercising:  No formal exercise.  3.  Medication compliance: compliant all of the time.  # History of vitamin B12 deficiency on vitamin D B12 supplement. # History of prostate cancer status post prostatectomy and radiation treatment, on Lupron.  Interval history  Glucometer data as reviewed above.  Diabetes regimen reviewed and as noted above.  Recent hemoglobin A1c 6.4%.  Denies complaints of numbness and ting of the feet.  No vision problem.  Recent lab results reviewed potassium 5.3, EGFR 48.  He had laboratory test in outside facility at [facility] on September 15, 2023 reviewed as follows.  Hemoglobin A1c 6.5% Urine microalbumin creatinine ratio 26.8, 1 normal.  Lipid levels LDL 77, total cholesterol 784, HDL 62, triglyceride 166, VLDL 33. eGFR 51 TSH 1.202 normal Vitamin B12 2088 elevated upper normal limit of 911. Iron studies normal.  REVIEW OF SYSTEMS As per history of present illness.   PAST MEDICAL HISTORY: Past Medical History:  Diagnosis Date   Diabetes mellitus without complication (HCC)    Hypertension    prostate ca dx'd 2006   prostatectomy and xrt    PAST SURGICAL HISTORY: History reviewed. No pertinent surgical history.  ALLERGIES: Allergies  Allergen Reactions   Effexor Xr [Venlafaxine Hcl] Rash    FAMILY HISTORY:  Family History  Problem Relation Age of Onset   Hypertension  Father    Heart disease Neg Hx    Diabetes Neg Hx     SOCIAL HISTORY: Social History   Socioeconomic History   Marital status: Married    Spouse name: Not on file   Number of children: Not on file   Years of education: Not on file   Highest education level: Not on file  Occupational History   Not on file  Tobacco Use   Smoking status: Every Day   Smokeless tobacco: Never  Substance and Sexual Activity   Alcohol use: Not on file   Drug use: Not on file   Sexual activity: Not on file  Other Topics Concern   Not on file  Social History Narrative   Not on file    Social Drivers of Health   Financial Resource Strain: Low Risk  (09/12/2023)   [facility]   Overall Financial Resource Strain (CARDIA)    Difficulty of Paying Living Expenses: Not hard at all  Food Insecurity: No Food Insecurity (09/12/2023)   Received from [facility]   Hunger Vital Sign    Worried About Running Out of Food in the Last Year: Never true    Ran Out of Food in the Last Year: Never true  Transportation Needs: No Transportation Needs (09/12/2023)   Received from [facility] - Transportation    Lack of Transportation (Medical): No    Lack of Transportation (Non-Medical): No  Physical Activity: Not on file  Stress: No Stress Concern Present (09/12/2023)   Received from [facility] - Occupational Stress Questionnaire    Feeling of Stress : Not at all  Social Connections: Socially Integrated (09/12/2023)   Received from [facility]   Social Network    How would you rate your social network (family, work, friends)?: Good participation with social networks    MEDICATIONS:  Current Outpatient Medications  Medication Sig Dispense Refill   amLODipine (NORVASC) 10 MG tablet Take 1 tablet (10 mg total) by mouth daily. (Patient taking differently: Take 10 mg by mouth daily. Take 1/2 tablet daily) 90 tablet 2   atorvastatin (LIPITOR) 10 MG tablet TAKE 1 TABLET(10 MG) BY MOUTH DAILY 90 tablet 0   Blood Glucose Monitoring Suppl (ACCU-CHEK GUIDE) w/Device KIT Use to check blood sugars daily 1 kit 0   empagliflozin (JARDIANCE) 10 MG TABS tablet Take 1 tablet (10 mg total) by mouth daily with breakfast. 30 tablet 3   glucose blood (ACCU-CHEK GUIDE) test strip Use to check blood sugars daily 100 each 2   hydrALAZINE (APRESOLINE) 25 MG tablet TAKE ONE TABLET BY MOUTH TWICE A DAY FOR BLOOD PRESSURE     Leuprolide Acetate (LUPRON DEPOT, 79-MONTH, IM) Inject into the muscle.     losartan (COZAAR) 25 MG tablet Take  1 tablet (25 mg total) by mouth daily. 90 tablet 3   metFORMIN (GLUCOPHAGE-XR) 500 MG 24 hr tablet Take 1 tab in the morning and 2 tabs in the evening. 135 tablet 3   MYRBETRIQ 50 MG TB24 tablet Take 1 tablet by mouth daily.     pioglitazone (ACTOS) 30 MG tablet TAKE 1 TABLET(30 MG) BY MOUTH DAILY 90 tablet 0   vitamin B-12 (CYANOCOBALAMIN) 500 MCG tablet Take 2 tablets by mouth daily.     No current facility-administered medications for this visit.    PHYSICAL EXAM: Vitals:   09/30/23 0921  BP: 138/80  Pulse: 97  Resp: 20  SpO2:  97%  Weight: 201 lb (91.2 kg)  Height: 5\' 10"  (1.778 m)    Body mass index is 28.84 kg/m.  Wt Readings from Last 3 Encounters:  09/30/23 201 lb (91.2 kg)  07/06/23 200 lb 11.2 oz (91 kg)  05/18/23 201 lb (91.2 kg)    General: Well developed, well nourished male in no apparent distress.  HEENT: AT/[place], no external lesions.  Eyes: Conjunctiva clear and no icterus. Neck: Neck supple  Lungs: Respirations not labored Neurologic: Alert, oriented, normal speech Extremities / Skin: Dry. No sores or rashes noted.  Psychiatric: Does not appear depressed or anxious  Diabetic Foot Exam - Simple   Simple Foot Form Diabetic Foot exam was performed with the following findings: Yes 09/30/2023  8:27 AM  Visual Inspection No deformities, no ulcerations, no other skin breakdown bilaterally: Yes Sensation Testing Intact to touch and monofilament testing bilaterally: Yes Pulse Check Posterior Tibialis and Dorsalis pulse intact bilaterally: Yes Comments    LABS Reviewed Lab Results  Component Value Date   HGBA1C 6.4 (H) 09/22/2023   HGBA1C 6.4 05/16/2023   HGBA1C 6.5 01/10/2023   Lab Results  Component Value Date   FRUCTOSAMINE 228 01/30/2020   Lab Results  Component Value Date   CHOL 187 09/22/2023   HDL 78 09/22/2023   LDLCALC 89 09/22/2023   LDLDIRECT 84.0 03/30/2021   TRIG 105 09/22/2023   CHOLHDL 2.4 09/22/2023   Lab Results  Component Value  Date   MICRALBCREAT 12.0 08/17/2022   MICRALBCREAT 4.6 09/28/2021   Lab Results  Component Value Date   CREATININE 1.48 (H) 09/22/2023   Lab Results  Component Value Date   GFR 48.60 (L) 06/01/2023    ASSESSMENT / PLAN  1. Controlled type 2 diabetes mellitus with complication, without long-term current use of insulin (HCC)      Diabetes Mellitus type 2, complicated by CKD/microalbuminuria. - Diabetic status / severity: Controlled  Lab Results  Component Value Date   HGBA1C 6.4 (H) 09/22/2023    - Hemoglobin A1c goal : <7%  - Medications: See below, no change.  I) continue metformin 500 mg extended release 1 tablet in the morning and 2 tablets in the evening. II) continue Jardiance 10 mg daily. III) continue pioglitazone/Actos 30 mg daily.  - Home glucose testing: In the morning fasting daily. - Discussed/ Gave Hypoglycemia treatment plan.  # Consult : not required at this time.   # Annual urine for microalbuminuria/ creatinine ratio, no microalbuminuria currently, currently , on ACE/ARB /losartan.  Recent urine microalbumin creatinine ratio done at [facility] was normal on September 15, 2023. Consider referral to nephrology if renal function declines in the future visits.  Last  Lab Results  Component Value Date   MICRALBCREAT 12.0 08/17/2022    # Foot check nightly / neuropathy.  # Annual dilated diabetic eye exams.   - Diet: Make healthy diabetic food choices - Life style / activity / exercise: Discussed.  2. Blood pressure  -  BP Readings from Last 1 Encounters:  09/30/23 138/80    - Control is in target.  - No change in current plans.  3. Lipid status / Hyperlipidemia - Last  Lab Results  Component Value Date   LDLCALC 89 09/22/2023   - Continue atorvastatin 10 mg daily.  Diagnoses and all orders for this visit:  Controlled type 2 diabetes mellitus with complication, without long-term current use of insulin (HCC)     DISPOSITION Follow up in  clinic in 4 months  suggested.    All questions answered and patient verbalized understanding of the plan.  Iraq Shaana Acocella, MD [facility] [address] [place], [address] [place], [place] [address] Phone # [phone]  At least part of this note was generated using voice recognition software. Inadvertent word errors may have occurred, which were not recognized during the proofreading process.

## 2023-10-19 ENCOUNTER — Telehealth: Payer: Medicare PPO | Admitting: Physician Assistant

## 2023-10-19 DIAGNOSIS — H00013 Hordeolum externum right eye, unspecified eyelid: Secondary | ICD-10-CM | POA: Diagnosis not present

## 2023-10-19 MED ORDER — POLYMYXIN B-TRIMETHOPRIM 10000-0.1 UNIT/ML-% OP SOLN: OPHTHALMIC | 0 refills | Status: AC

## 2023-10-19 NOTE — Patient Instructions (Signed)
Richard [name], thank you for joining [name], PA-C for today's virtual visit.  While this provider is not your primary care provider (PCP), if your PCP is located in our provider database this encounter information will be shared with them immediately following your visit.   A [facility] MyChart account gives you access to today's visit and all your visits, tests, and labs performed at [facility] " click here if you don't have a [facility] MyChart account or go to [email or web address]  Consent: (Patient) Richard [name] provided verbal consent for this virtual visit at the beginning of the encounter.  Current Medications:  Current Outpatient Medications:    amLODipine (NORVASC) 10 MG tablet, Take 1 tablet (10 mg total) by mouth daily. (Patient taking differently: Take 10 mg by mouth daily. Take 1/2 tablet daily), Disp: 90 tablet, Rfl: 2   atorvastatin (LIPITOR) 10 MG tablet, TAKE 1 TABLET(10 MG) BY MOUTH DAILY, Disp: 90 tablet, Rfl: 0   Blood Glucose Monitoring Suppl (ACCU-CHEK GUIDE) w/Device KIT, Use to check blood sugars daily, Disp: 1 kit, Rfl: 0   empagliflozin (JARDIANCE) 10 MG TABS tablet, Take 1 tablet (10 mg total) by mouth daily with breakfast., Disp: 30 tablet, Rfl: 3   glucose blood (ACCU-CHEK GUIDE) test strip, Use to check blood sugars daily, Disp: 100 each, Rfl: 2   hydrALAZINE (APRESOLINE) 25 MG tablet, TAKE ONE TABLET BY MOUTH TWICE A DAY FOR BLOOD PRESSURE, Disp: , Rfl:    Leuprolide Acetate (LUPRON DEPOT, 35-MONTH, IM), Inject into the muscle., Disp: , Rfl:    losartan (COZAAR) 25 MG tablet, Take 1 tablet (25 mg total) by mouth daily., Disp: 90 tablet, Rfl: 3   metFORMIN (GLUCOPHAGE-XR) 500 MG 24 hr tablet, Take 1 tab in the morning and 2 tabs in the evening., Disp: 135 tablet, Rfl: 3   MYRBETRIQ 50 MG TB24 tablet, Take 1 tablet by mouth daily., Disp: , Rfl:    pioglitazone (ACTOS) 30 MG tablet, TAKE 1 TABLET(30 MG) BY MOUTH DAILY, Disp: 90  tablet, Rfl: 0   vitamin B-12 (CYANOCOBALAMIN) 500 MCG tablet, Take 2 tablets by mouth daily., Disp: , Rfl:    Medications ordered in this encounter:  No orders of the defined types were placed in this encounter.    *If you need refills on other medications prior to your next appointment, please contact your pharmacy*  Follow-Up: Call back or seek an in-person evaluation if the symptoms worsen or if the condition fails to improve as anticipated.  [facility] [phone]  Other Instructions Stye A stye, also known as a hordeolum, is a bump that forms on an eyelid. It may look like a pimple next to the eyelash. A stye can form inside the eyelid (internal stye) or outside the eyelid (external stye). A stye can cause redness, swelling, and pain on the eyelid. Styes are very common. Anyone can get them at any age. They usually occur in just one eye at a time, but you may have more than one in either eye. What are the causes? A stye is caused by an infection. The infection is almost always caused by bacteria called Staphylococcus aureus. This is a common type of bacteria that lives on the skin. An internal stye may result from an infected oil-producing gland inside the eyelid. An external stye may be caused by an infection at the base of the eyelash (hair follicle). What increases the risk? You are more likely to develop a stye  if: You have had a stye before. You have any of these conditions: Red, itchy, inflamed eyelids (blepharitis). A skin condition such as seborrheic dermatitis or rosacea. High fat levels in your blood (lipids). Dry eyes. What are the signs or symptoms? The most common symptom of a stye is eyelid pain. Internal styes are more painful than external styes. Other symptoms may include: Painful swelling of your eyelid. A scratchy feeling in your eye. Tearing and redness of your eye. A pimple-like bump on the edge of the eyelid. Pus draining from the  stye. How is this diagnosed? Your health care provider may be able to diagnose a stye just by examining your eye. The health care provider may also check to make sure: You do not have a fever or other signs of a more serious infection. The infection has not spread to other parts of your eye or areas around your eye. How is this treated? Most styes will clear up in a few days without treatment or with warm compresses applied to the area. You may need to use antibiotic drops or ointment to treat an infection. Sometimes, steroid drops or ointment are used in addition to antibiotics. In some cases, your health care provider may give you a small steroid injection in the eyelid. If your stye does not heal with routine treatment, your health care provider may drain pus from the stye using a thin blade or needle. This may be done if the stye is large, causing a lot of pain, or affecting your vision. Follow these instructions at home: Take over-the-counter and prescription medicines only as told by your health care provider. This includes eye drops or ointments. If you were prescribed an antibiotic medicine, steroid medicine, or both, apply or use them as told by your health care provider. Do not stop using the medicine even if your condition improves. Apply a warm, wet cloth (warm compress) to your eye for 5-10 minutes, 4 to 6 times a day. Clean the affected eyelid as directed by your health care provider. Do not wear contact lenses or eye makeup until your stye has healed and your health care provider says that it is safe. Do not try to pop or drain the stye. Do not rub your eye. Contact a health care provider if: You have chills or a fever. Your stye does not go away after several days. Your stye affects your vision. Your eyeball becomes swollen, red, or painful. Get help right away if: You have pain when moving your eye around. Summary A stye is a bump that forms on an eyelid. It may look like a  pimple next to the eyelash. A stye can form inside the eyelid (internal stye) or outside the eyelid (external stye). A stye can cause redness, swelling, and pain on the eyelid. Your health care provider may be able to diagnose a stye just by examining your eye. Apply a warm, wet cloth (warm compress) to your eye for 5-10 minutes, 4 to 6 times a day. This information is not intended to replace advice given to you by your health care provider. Make sure you discuss any questions you have with your health care provider. Document Revised: 10/22/2020 Document Reviewed: 10/22/2020 Elsevier Patient Education  2024 Elsevier Inc.   If you have been instructed to have an in-person evaluation today at a local Urgent Care facility, please use the link below. It will take you to a list of all of our available [facility], including address,  phone number and hours of operation. Please do not delay care.  [facility]  If you or a family member do not have a primary care provider, use the link below to schedule a visit and establish care. When you choose a [facility] primary care physician or advanced practice provider, you gain a long-term partner in health. Find a Primary Care Provider  Learn more about [facility] in-office and virtual care options: [facility] - Get Care Now

## 2023-10-19 NOTE — Progress Notes (Signed)
Virtual Visit Consent   Richard [name], you are scheduled for a virtual visit with a [facility] provider today. Just as with appointments in the office, your consent must be obtained to participate. Your consent will be active for this visit and any virtual visit you may have with one of our providers in the next 365 days. If you have a MyChart account, a copy of this consent can be sent to you electronically.  As this is a virtual visit, video technology does not allow for your provider to perform a traditional examination. This may limit your provider's ability to fully assess your condition. If your provider identifies any concerns that need to be evaluated in person or the need to arrange testing (such as labs, EKG, etc.), we will make arrangements to do so. Although advances in technology are sophisticated, we cannot ensure that it will always work on either your end or our end. If the connection with a video visit is poor, the visit may have to be switched to a telephone visit. With either a video or telephone visit, we are not always able to ensure that we have a secure connection.  By engaging in this virtual visit, you consent to the provision of healthcare and authorize for your insurance to be billed (if applicable) for the services provided during this visit. Depending on your insurance coverage, you may receive a charge related to this service.  I need to obtain your verbal consent now. Are you willing to proceed with your visit today? Richard [name] has provided verbal consent on 10/19/2023 for a virtual visit (video or telephone). Richard [name], [place]  Date: 10/19/2023 12:12 PM   Virtual Visit via Video Note   I, Richard [name], connected with  [name]  ([id number], 10-17-1945) on 10/19/23 at 12:00 PM EST by a video-enabled telemedicine application and verified that I am speaking with the correct person using two identifiers.  Location: Patient: Virtual Visit  Location Patient: Home Provider: Virtual Visit Location Provider: Home Office   I discussed the limitations of evaluation and management by telemedicine and the availability of in person appointments. The patient expressed understanding and agreed to proceed.    History of Present Illness: Richard [name] is a 78 y.o. who identifies as a male who was assigned male at birth, and is being seen today for stye of right lower eyelid.  Patient endorses yesterday afternoon noting some mild swelling and tenderness of his right lower eyelid.  This morning with a palpable bump in the eyelid that is quite tender.  Denies any active drainage from the area.  Denies vision changes.  Is also noting swelling of left eyelids as well.  Denies any recent allergy symptoms including nasal congestion, itchy/watery eyes or runny nose.  Denies fever, chills, malaise or fatigue.  Does not wear contact lenses.  HPI: HPI  Problems:  Patient Active Problem List   Diagnosis Date Noted   Prostate cancer (HCC) 10/28/2022   Post traumatic stress disorder (PTSD) 07/15/2014   Vitamin B 12 deficiency 12/12/2013   H/O prostate cancer 12/12/2013   Essential hypertension 08/10/2013   Other and unspecified hyperlipidemia 08/10/2013   Controlled type 2 diabetes mellitus with complication, without long-term current use of insulin (HCC) 08/07/2013    Allergies:  Allergies  Allergen Reactions   Effexor Xr [Venlafaxine Hcl] Rash   Medications:  Current Outpatient Medications:    trimethoprim-polymyxin b (POLYTRIM) ophthalmic solution, Apply 1-2 drops into affected eye  QID x 5 days., Disp: 10 mL, Rfl: 0   amLODipine (NORVASC) 10 MG tablet, Take 1 tablet (10 mg total) by mouth daily. (Patient taking differently: Take 10 mg by mouth daily. Take 1/2 tablet daily), Disp: 90 tablet, Rfl: 2   atorvastatin (LIPITOR) 10 MG tablet, TAKE 1 TABLET(10 MG) BY MOUTH DAILY, Disp: 90 tablet, Rfl: 0   Blood Glucose Monitoring Suppl (ACCU-CHEK  GUIDE) w/Device KIT, Use to check blood sugars daily, Disp: 1 kit, Rfl: 0   empagliflozin (JARDIANCE) 10 MG TABS tablet, Take 1 tablet (10 mg total) by mouth daily with breakfast., Disp: 30 tablet, Rfl: 3   glucose blood (ACCU-CHEK GUIDE) test strip, Use to check blood sugars daily, Disp: 100 each, Rfl: 2   hydrALAZINE (APRESOLINE) 25 MG tablet, TAKE ONE TABLET BY MOUTH TWICE A DAY FOR BLOOD PRESSURE, Disp: , Rfl:    Leuprolide Acetate (LUPRON DEPOT, 95-MONTH, IM), Inject into the muscle., Disp: , Rfl:    losartan (COZAAR) 25 MG tablet, Take 1 tablet (25 mg total) by mouth daily., Disp: 90 tablet, Rfl: 3   metFORMIN (GLUCOPHAGE-XR) 500 MG 24 hr tablet, Take 1 tab in the morning and 2 tabs in the evening., Disp: 135 tablet, Rfl: 3   MYRBETRIQ 50 MG TB24 tablet, Take 1 tablet by mouth daily., Disp: , Rfl:    pioglitazone (ACTOS) 30 MG tablet, TAKE 1 TABLET(30 MG) BY MOUTH DAILY, Disp: 90 tablet, Rfl: 0   vitamin B-12 (CYANOCOBALAMIN) 500 MCG tablet, Take 2 tablets by mouth daily., Disp: , Rfl:   Observations/Objective: Patient is well-developed, well-nourished in no acute distress.  Resting comfortably at home.  Head is normocephalic, atraumatic.  No labored breathing. Speech is clear and coherent with logical content.  Patient is alert and oriented at baseline.  Right lower eyelid swelling noted on examination.  Hard to visualize an actual stye.  Left upper and lower eyelids with erythema and mild swelling as well.  No conjunctival injection appreciated.  Pupils are equal and round.  Assessment and Plan: 1. Hordeolum externum of right eye, unspecified eyelid (Primary) - trimethoprim-polymyxin b (POLYTRIM) ophthalmic solution; Apply 1-2 drops into affected eye QID x 5 days.  Dispense: 10 mL; Refill: 0  With some concern for bilateral blepharitis.  No evidence of periorbital present.  Supportive measures reviewed.  Encourage warm compresses throughout the day.  Will start Polytrim OP.  In person  evaluation for any nonresolving, new or worsening symptoms despite treatment.  Follow Up Instructions: I discussed the assessment and treatment plan with the patient. The patient was provided an opportunity to ask questions and all were answered. The patient agreed with the plan and demonstrated an understanding of the instructions.  A copy of instructions were sent to the patient via MyChart unless otherwise noted below.   The patient was advised to call back or seek an in-person evaluation if the symptoms worsen or if the condition fails to improve as anticipated.    Richard Climes, PA-C

## 2023-10-21 ENCOUNTER — Telehealth: Payer: Self-pay | Admitting: Hematology and Oncology

## 2023-10-26 ENCOUNTER — Inpatient Hospital Stay: Payer: Medicare PPO

## 2023-10-26 ENCOUNTER — Inpatient Hospital Stay: Payer: Medicare PPO | Admitting: Hematology and Oncology

## 2023-10-26 ENCOUNTER — Inpatient Hospital Stay: Payer: Medicare PPO | Attending: Hematology and Oncology

## 2023-10-26 ENCOUNTER — Other Ambulatory Visit: Payer: Self-pay | Admitting: *Deleted

## 2023-10-26 DIAGNOSIS — C61 Malignant neoplasm of prostate: Secondary | ICD-10-CM

## 2023-10-26 LAB — CBC WITH DIFFERENTIAL (CANCER CENTER ONLY)
Abs Immature Granulocytes: 0.01 10*3/uL (ref 0.00–0.07)
Basophils Absolute: 0 10*3/uL (ref 0.0–0.1)
Basophils Relative: 1 %
Eosinophils Absolute: 0.1 10*3/uL (ref 0.0–0.5)
Eosinophils Relative: 2 %
HCT: 40 % (ref 39.0–52.0)
Hemoglobin: 12.6 g/dL — ABNORMAL LOW (ref 13.0–17.0)
Immature Granulocytes: 0 %
Lymphocytes Relative: 33 %
Lymphs Abs: 1.7 10*3/uL (ref 0.7–4.0)
MCH: 31.8 pg (ref 26.0–34.0)
MCHC: 31.5 g/dL (ref 30.0–36.0)
MCV: 101 fL — ABNORMAL HIGH (ref 80.0–100.0)
Monocytes Absolute: 0.5 10*3/uL (ref 0.1–1.0)
Monocytes Relative: 11 %
Neutro Abs: 2.7 10*3/uL (ref 1.7–7.7)
Neutrophils Relative %: 53 %
Platelet Count: 212 10*3/uL (ref 150–400)
RBC: 3.96 MIL/uL — ABNORMAL LOW (ref 4.22–5.81)
RDW: 13.2 % (ref 11.5–15.5)
WBC Count: 5 10*3/uL (ref 4.0–10.5)
nRBC: 0 % (ref 0.0–0.2)

## 2023-10-26 LAB — CMP (CANCER CENTER ONLY)
ALT: 10 U/L (ref 0–44)
AST: 14 U/L — ABNORMAL LOW (ref 15–41)
Albumin: 3.9 g/dL (ref 3.5–5.0)
Alkaline Phosphatase: 59 U/L (ref 38–126)
Anion gap: 5 (ref 5–15)
BUN: 27 mg/dL — ABNORMAL HIGH (ref 8–23)
CO2: 29 mmol/L (ref 22–32)
Calcium: 9.3 mg/dL (ref 8.9–10.3)
Chloride: 107 mmol/L (ref 98–111)
Creatinine: 1.51 mg/dL — ABNORMAL HIGH (ref 0.61–1.24)
GFR, Estimated: 47 mL/min — ABNORMAL LOW (ref >60)
Glucose, Bld: 117 mg/dL — ABNORMAL HIGH (ref 70–99)
Potassium: 4.6 mmol/L (ref 3.5–5.1)
Sodium: 141 mmol/L (ref 135–145)
Total Bilirubin: 0.5 mg/dL (ref 0.0–1.2)
Total Protein: 6.3 g/dL — ABNORMAL LOW (ref 6.5–8.1)

## 2023-10-27 ENCOUNTER — Other Ambulatory Visit: Payer: Self-pay | Admitting: Physician Assistant

## 2023-10-27 DIAGNOSIS — C61 Malignant neoplasm of prostate: Secondary | ICD-10-CM

## 2023-10-27 LAB — TESTOSTERONE: Testosterone: <3 ng/dL — ABNORMAL LOW (ref 264–916)

## 2023-10-27 LAB — PROSTATE-SPECIFIC AG, SERUM (LABCORP): Prostate Specific Ag, Serum: <0.1 ng/mL (ref 0.0–4.0)

## 2023-11-02 ENCOUNTER — Inpatient Hospital Stay: Payer: Medicare PPO

## 2023-11-02 ENCOUNTER — Inpatient Hospital Stay: Payer: Medicare PPO | Attending: Hematology and Oncology | Admitting: Physician Assistant

## 2023-11-02 ENCOUNTER — Inpatient Hospital Stay: Payer: Medicare PPO | Admitting: Hematology and Oncology

## 2023-11-02 VITALS — BP 142/64 | HR 67 | Temp 98.2°F | Resp 13 | Wt 195.7 lb

## 2023-11-02 DIAGNOSIS — Z5111 Encounter for antineoplastic chemotherapy: Secondary | ICD-10-CM | POA: Insufficient documentation

## 2023-11-02 DIAGNOSIS — C61 Malignant neoplasm of prostate: Secondary | ICD-10-CM

## 2023-11-02 DIAGNOSIS — Z8546 Personal history of malignant neoplasm of prostate: Secondary | ICD-10-CM

## 2023-11-02 MED ORDER — LEUPROLIDE ACETATE (4 MONTH) 30 MG ~~LOC~~ KIT
30.0000 mg | PACK | Freq: Once | SUBCUTANEOUS | Status: AC
  Administered 2023-11-02: 30 mg via SUBCUTANEOUS
  Filled 2023-11-02: qty 30

## 2023-11-03 ENCOUNTER — Encounter: Payer: Self-pay | Admitting: Hematology and Oncology

## 2023-11-03 ENCOUNTER — Telehealth: Payer: Self-pay | Admitting: Hematology and Oncology

## 2023-11-03 NOTE — Progress Notes (Signed)
 [facility] Telephone:([phone]) [phone]   [phone]  PROGRESS NOTE  Patient Care Team: Sherwood Gambler, MD as PCP - General (Internal Medicine)  Hematological/Oncological History # Advanced Castrate Sensitive Prostate Cancer  11/25/2004 : diagnosed with Prostate cancer, underwent retropubic prostatectomy and bilateral lymph node dissection. The pathology showed a Gleason score of 4+4 equals 8 with staging of T2b N0.  3-12/2005: salvage radiation therapy for a rise in his PSA to 0.29. He received total of 65 gray in 35 fractions.  2016: castration-sensitive with biochemical relapse. Gleason score 4+3 equal 7 and a PSA of 3.66 at the time  06/17/2022: last visit with Dr. Clelia Croft.  10/28/2022: transfer care to Dr. Leonides Schanz   Interval History:  Richard [name] 78 y.o. male with medical history significant for castrate sensitive prostate cancer who presents for a follow up visit. The patient's last visit was on 11/06//2024. In the interim since the last visit he has had no major changes in his health.  On exam today Richard [name] reports he has lost some weight due to decreased PO intake and skipping breakfast. He adds that he gets full easily. He reports his energy levels are stable and he can complete his baseline ADLs. He denies nausea, vomiting or bowel habit changes. He denies easy bruising or signs of bleeding. He had one episode of dizziness earlier this week without syncopal episode.  He reports he is not having any trouble with his Eligard therapy.  He is not having any hot flashes or sweats.  Overall he is at his baseline level of health and is willing and able to proceed with continued Eligard shots at this time.  He denies any fevers, chills, sweats, shortness of breath, chest pain or cough.  He is has no new bone or back pain.  A full 10 point ROS was otherwise negative.  MEDICAL HISTORY:  Past Medical History:  Diagnosis Date   Diabetes mellitus without complication  (HCC)    Hypertension    prostate ca dx'd 2006   prostatectomy and xrt    SURGICAL HISTORY: No past surgical history on file.  SOCIAL HISTORY: Social History   Socioeconomic History   Marital status: Married    Spouse name: Not on file   Number of children: Not on file   Years of education: Not on file   Highest education level: Not on file  Occupational History   Not on file  Tobacco Use   Smoking status: Every Day   Smokeless tobacco: Never  Substance and Sexual Activity   Alcohol use: Not on file   Drug use: Not on file   Sexual activity: Not on file  Other Topics Concern   Not on file  Social History Narrative   Not on file   Social Drivers of Health   Financial Resource Strain: Low Risk  (09/12/2023)   Received from [facility]   Overall Financial Resource Strain (CARDIA)    Difficulty of Paying Living Expenses: Not hard at all  Food Insecurity: No Food Insecurity (09/12/2023)   Received from [facility]   Hunger Vital Sign    Worried About Running Out of Food in the Last Year: Never true    Ran Out of Food in the Last Year: Never true  Transportation Needs: No Transportation Needs (09/12/2023)   Received from [facility] - Transportation    Lack of Transportation (Medical): No    Lack of Transportation (Non-Medical): No  Physical Activity:  Not on file  Stress: No Stress Concern Present (09/12/2023)   Received from [facility] - Occupational Stress Questionnaire    Feeling of Stress : Not at all  Social Connections: Socially Integrated (09/12/2023)   Received from [facility]   Social Network    How would you rate your social network (family, work, friends)?: Good participation with social networks  Intimate Partner Violence: Not At Risk (09/12/2023)   Received from [facility]   HITS    Over the last 12 months how often did your partner physically hurt you?: Never    Over the last 12 months  how often did your partner insult you or talk down to you?: Never    Over the last 12 months how often did your partner threaten you with physical harm?: Never    Over the last 12 months how often did your partner scream or curse at you?: Never    FAMILY HISTORY: Family History  Problem Relation Age of Onset   Hypertension Father    Heart disease Neg Hx    Diabetes Neg Hx     ALLERGIES:  is allergic to effexor xr [venlafaxine hcl].  MEDICATIONS:  Current Outpatient Medications  Medication Sig Dispense Refill   amLODipine (NORVASC) 10 MG tablet Take 1 tablet (10 mg total) by mouth daily. (Patient taking differently: Take 10 mg by mouth daily. Take 1/2 tablet daily) 90 tablet 2   atorvastatin (LIPITOR) 10 MG tablet TAKE 1 TABLET(10 MG) BY MOUTH DAILY 90 tablet 0   Blood Glucose Monitoring Suppl (ACCU-CHEK GUIDE) w/Device KIT Use to check blood sugars daily 1 kit 0   empagliflozin (JARDIANCE) 10 MG TABS tablet Take 1 tablet (10 mg total) by mouth daily with breakfast. 30 tablet 3   glucose blood (ACCU-CHEK GUIDE) test strip Use to check blood sugars daily 100 each 2   hydrALAZINE (APRESOLINE) 25 MG tablet TAKE ONE TABLET BY MOUTH TWICE A DAY FOR BLOOD PRESSURE     Leuprolide Acetate (LUPRON DEPOT, 64-MONTH, IM) Inject into the muscle.     losartan (COZAAR) 25 MG tablet Take 1 tablet (25 mg total) by mouth daily. 90 tablet 3   metFORMIN (GLUCOPHAGE-XR) 500 MG 24 hr tablet Take 1 tab in the morning and 2 tabs in the evening. 135 tablet 3   MYRBETRIQ 50 MG TB24 tablet Take 1 tablet by mouth daily.     pioglitazone (ACTOS) 30 MG tablet TAKE 1 TABLET(30 MG) BY MOUTH DAILY 90 tablet 0   vitamin B-12 (CYANOCOBALAMIN) 500 MCG tablet Take 2 tablets by mouth daily.     trimethoprim-polymyxin b (POLYTRIM) ophthalmic solution Apply 1-2 drops into affected eye QID x 5 days. (Patient not taking: Reported on 11/02/2023) 10 mL 0   No current facility-administered medications for this visit.    REVIEW OF  SYSTEMS:   Constitutional: ( - ) fevers, ( - )  chills , ( - ) night sweats Eyes: ( - ) blurriness of vision, ( - ) double vision, ( - ) watery eyes Ears, nose, mouth, throat, and face: ( - ) mucositis, ( - ) sore throat Respiratory: ( - ) cough, ( - ) dyspnea, ( - ) wheezes Cardiovascular: ( - ) palpitation, ( - ) chest discomfort, ( - ) lower extremity swelling Gastrointestinal:  ( - ) nausea, ( - ) heartburn, ( - ) change in bowel habits Skin: ( - ) abnormal skin rashes Lymphatics: ( - )  new lymphadenopathy, ( - ) easy bruising Neurological: ( - ) numbness, ( - ) tingling, ( - ) new weaknesses Behavioral/Psych: ( - ) mood change, ( - ) new changes  All other systems were reviewed with the patient and are negative.  PHYSICAL EXAMINATION: ECOG PERFORMANCE STATUS: 1 - Symptomatic but completely ambulatory  Vitals:   11/02/23 1340  BP: (!) 142/64  Pulse: 67  Resp: 13  Temp: 98.2 F (36.8 C)  SpO2: 100%     Filed Weights   11/02/23 1340  Weight: 195 lb 11.2 oz (88.8 kg)   GENERAL: Well-appearing elderly Caucasian male, alert, no distress and comfortable SKIN: skin color, texture, turgor are normal, no rashes or significant lesions EYES: conjunctiva are pink and non-injected, sclera clear LUNGS: clear to auscultation and percussion with normal breathing effort HEART: regular rate & rhythm and no murmurs and no lower extremity edema Musculoskeletal: no cyanosis of digits and no clubbing  PSYCH: alert & oriented x 3, fluent speech NEURO: no focal motor/sensory deficits  LABORATORY DATA:  I have reviewed the data as listed    Latest Ref Rng & Units 10/26/2023    8:34 AM 06/29/2023    2:07 PM 03/02/2023    9:42 AM  CBC  WBC 4.0 - 10.5 K/uL 5.0  6.7  6.7   Hemoglobin 13.0 - 17.0 g/dL 16.1  09.6  04.5   Hematocrit 39.0 - 52.0 % 40.0  40.2  43.5   Platelets 150 - 400 K/uL 212  219  226        Latest Ref Rng & Units 10/26/2023    8:34 AM 09/22/2023    8:12 AM 06/29/2023     2:07 PM  CMP  Glucose 70 - 99 mg/dL 409  811  914   BUN 8 - 23 mg/dL 27  25  26    Creatinine 0.61 - 1.24 mg/dL 7.82  9.56  2.13   Sodium 135 - 145 mmol/L 141  140  136   Potassium 3.5 - 5.1 mmol/L 4.6  5.3  5.1   Chloride 98 - 111 mmol/L 107  104  101   CO2 22 - 32 mmol/L 29  27  29    Calcium 8.9 - 10.3 mg/dL 9.3  9.8  9.5   Total Protein 6.5 - 8.1 g/dL 6.3   6.7   Total Bilirubin 0.0 - 1.2 mg/dL 0.5   0.5   Alkaline Phos 38 - 126 U/L 59   57   AST 15 - 41 U/L 14   17   ALT 0 - 44 U/L 10   12    RADIOGRAPHIC STUDIES: No results found.  ASSESSMENT & PLAN SHELVY PERAZZO 78 y.o. male with medical history significant for castrate sensitive prostate cancer who presents for a follow up visit.   # Advanced Castrate Sensitive Prostate Cancer  -- continue eligard 30 mg q 4 months Subq -- labs show white blood cell 6.7, Hgb 13.0, MCV 100.2, Plt 219. Creatinine stable at 1.51. LFTs in range. PSA was <0.1, testosterone <3 --No clear indication for imaging at this time. -- RTC in 4 months for repeat clinic visit and continued eligard.   No orders of the defined types were placed in this encounter.   All questions were answered. The patient knows to call the clinic with any problems, questions or concerns.   I have spent a total of 30 minutes minutes of face-to-face and non-face-to-face time, preparing to see the patient, performing a  medically appropriate examination, counseling and educating the patient, documenting clinical information in the electronic health record, independently interpreting results and communicating results to the patient, and care coordination.   Georga Kaufmann PA-C Dept of Hematology and Oncology [facility] Phone: [phone]   11/03/2023 8:49 AM

## 2023-11-08 ENCOUNTER — Telehealth: Payer: Self-pay | Admitting: Dietician

## 2023-11-08 NOTE — Telephone Encounter (Signed)
Patient screened on MST. First attempt to reach. Provided my cell# on voice mail and in text to return call to set up a nutrition consult. ? ?Cyndi , RDN, LDN ?Registered Dietitian, [facility] ?Part Time Remote (Usual office hours: Tuesday-Thursday) ?Cell: [phone]   ?

## 2023-11-10 ENCOUNTER — Telehealth: Payer: Self-pay | Admitting: Dietician

## 2023-11-10 NOTE — Telephone Encounter (Signed)
Patient screened on MST. Second attempt to reach. Provided my cell# on voice mail and in text to return call to set up a nutrition consult.  Cyndi Dinger, RDN, LDN Registered Dietitian, [facility] Part Time Remote (Usual office hours: Tuesday-Thursday) Cell: [phone]   

## 2023-11-15 ENCOUNTER — Inpatient Hospital Stay: Admitting: Dietician

## 2023-11-15 NOTE — Progress Notes (Signed)
 Nutrition Assessment  Wife [name] called and returned messages left last week, patient with her for call.  Reason for Assessment: MST screen for weight loss.    ASSESSMENT: Patient is a 78 y.o. male with medical history significant for castrate sensitive prostate cancer and PMHx that includes DM2, Vit B12 deficiency. HLD and PTSD.  Patient reports appetite is fine, denies any barriers to eating. He reports last few years just eating until full then stopping which has resulted in some gradual weight loss.  He reports biggest concern is he doesn't start eating until 10-11am. Wakes at 8am.  Other meals varied with nutrient dense foods, drinks milk for Vit D, take supplemental Vit B12 with his metformin.  Anthropometrics: loss of 6# (2.9%) past 2 months, not significant for timeframe  Height: 70" Weight: 195.8# UBW: declining past 2 years BMI: 28.08    NUTRITION DIAGNOSIS:  Food and Nutrition Related Knowledge Deficit related to cancer and associated treatments as evidenced by no prior need for nutrition related information.   INTERVENTION:  Relayed that nutrition services are wrap around service provided at no charge and encouraged continued communication if experiencing continued weight loss or any nutritional impact symptoms (NIS). Educated on importance of adequate calorie and protein energy intake  with nutrient dense foods when possible to maintain weight/strength Encouraged small high protein intake upon waling prior to current first meal. Suggested he reach out if he isn't meeting goal of weight maintenance and or improved blood counts.  MONITORING, EVALUATION, GOAL: weight, PO intake, Nutrition Impact Symptoms, labs Goal is weight maintenance  Next Visit: PRN at patient or provider request  Gennaro Africa, RDN, LDN Registered Dietitian, [facility] Part Time Remote (Usual office hours: Tuesday-Thursday) Cell: [phone]

## 2023-11-25 NOTE — Telephone Encounter (Signed)
 Done

## 2024-01-30 ENCOUNTER — Ambulatory Visit: Payer: Medicare PPO | Admitting: Endocrinology

## 2024-01-30 ENCOUNTER — Encounter: Payer: Self-pay | Admitting: Endocrinology

## 2024-01-30 ENCOUNTER — Ambulatory Visit: Payer: Self-pay | Admitting: Endocrinology

## 2024-01-30 VITALS — BP 138/60 | HR 61 | Resp 20 | Ht 70.0 in | Wt 195.0 lb

## 2024-01-30 DIAGNOSIS — Z7984 Long term (current) use of oral hypoglycemic drugs: Secondary | ICD-10-CM | POA: Diagnosis not present

## 2024-01-30 DIAGNOSIS — E118 Type 2 diabetes mellitus with unspecified complications: Secondary | ICD-10-CM

## 2024-01-30 LAB — POCT GLYCOSYLATED HEMOGLOBIN (HGB A1C): Hemoglobin A1C: 6 % — AB (ref 4.0–5.6)

## 2024-01-30 NOTE — Progress Notes (Addendum)
 Outpatient Endocrinology Note [place] Emilene Roma, MD  01/30/24  Patient's Name: Richard [name]    DOB: Mar 20, 1946    MRN: [id number]                                                    REASON OF VISIT: Follow up for type 2 diabetes mellitus  PCP: Tatiana Farrier, MD  HISTORY OF PRESENT ILLNESS:   Richard [name] is a 78 y.o. old male with past medical history listed below, is here for follow up of type 2 diabetes mellitus.    Pertinent Diabetes History: Patient was diagnosed with type 2 diabetes mellitus in 1995.  Hemoglobin A1c at that time was 9.6%.  He had been on various antidiabetic medications orals in the past.  He has generally well-controlled diabetes mellitus.    Chronic Diabetes Complications : Retinopathy: no. Last ophthalmology exam was done on annually, reportedly at [place]. Nephropathy: yes, CKD IIIa, microalbuminuria.  Used to be on losartan  100 mg daily stopped due to hyperkalemia. Peripheral neuropathy: no Coronary artery disease: no Stroke: no  Relevant comorbidities and cardiovascular risk factors: Obesity: no Body mass index is 27.98 kg/m.  Hypertension: yes Hyperlipidemia. Yes, on statin.   Current / Home Diabetic regimen includes: Metformin  extended release 500 mg in the morning and 1000 mg in the evening.   Pioglitazone /Actos  30 mg daily. Jardiance  10 mg daily.  He is getting medications from the [place].  Prior diabetic medications: Amaryl , caused hypoglycemia.  Glycemic data:   Freestyle libre [employer], not able to download in the clinic today.  From the meter, some of the blood sugar are 90, 95, 104, 152.  Hypoglycemia: Patient has no hypoglycemic episodes. Patient has hypoglycemia awareness.  Factors modifying glucose control: 1.  Diabetic diet assessment: 3 meals a day however lighter meals.  Mostly lunch with the largest meal of the day.  2.  Staying active or exercising: No formal exercise.  3.  Medication compliance: compliant all of  the time.  # History of vitamin B12 deficiency on vitamin D B12 supplement. # History of prostate cancer status post prostatectomy and radiation treatment, on Lupron .  Interval history  Diabetes regimen as reviewed and noted above.  Hemoglobin A1c today is 6%.  Reports compliance with diabetic medication.  No numbness and ting of the feet.  No vision problem.  No other complaints today.  REVIEW OF SYSTEMS As per history of present illness.   PAST MEDICAL HISTORY: Past Medical History:  Diagnosis Date   Diabetes mellitus without complication (HCC)    Hypertension    prostate ca dx'd 2006   prostatectomy and xrt    PAST SURGICAL HISTORY: No past surgical history on file.  ALLERGIES: Allergies  Allergen Reactions   Effexor  Xr [Venlafaxine  Hcl] Rash    FAMILY HISTORY:  Family History  Problem Relation Age of Onset   Hypertension Father    Heart disease Neg Hx    Diabetes Neg Hx     SOCIAL HISTORY: Social History   Socioeconomic History   Marital status: Married    Spouse name: Not on file   Number of children: Not on file   Years of education: Not on file   Highest education level: Not on file  Occupational History   Not on file  Tobacco Use   Smoking  status: Every Day   Smokeless tobacco: Never  Substance and Sexual Activity   Alcohol use: Not on file   Drug use: Not on file   Sexual activity: Not on file  Other Topics Concern   Not on file  Social History Narrative   Not on file   Social Drivers of Health   Financial Resource Strain: Low Risk  (09/12/2023)   Received from [facility]   Overall Financial Resource Strain (CARDIA)    Difficulty of Paying Living Expenses: Not hard at all  Food Insecurity: No Food Insecurity (09/12/2023)   Received from [facility]   Hunger Vital Sign    Worried About Running Out of Food in the Last Year: Never true    Ran Out of Food in the Last Year: Never true  Transportation Needs: No Transportation Needs  (09/12/2023)   Received from [facility] - Transportation    Lack of Transportation (Medical): No    Lack of Transportation (Non-Medical): No  Physical Activity: Not on file  Stress: No Stress Concern Present (09/12/2023)   Received from [facility] - Occupational Stress Questionnaire    Feeling of Stress : Not at all  Social Connections: Socially Integrated (09/12/2023)   Received from [facility]   Social Network    How would you rate your social network (family, work, friends)?: Good participation with social networks    MEDICATIONS:  Current Outpatient Medications  Medication Sig Dispense Refill   amLODipine  (NORVASC ) 10 MG tablet Take 1 tablet (10 mg total) by mouth daily. (Patient taking differently: Take 10 mg by mouth daily. Take 1/2 tablet daily) 90 tablet 2   atorvastatin  (LIPITOR) 10 MG tablet TAKE 1 TABLET(10 MG) BY MOUTH DAILY 90 tablet 0   Blood Glucose Monitoring Suppl (ACCU-CHEK GUIDE) w/Device KIT Use to check blood sugars daily 1 kit 0   empagliflozin  (JARDIANCE ) 10 MG TABS tablet Take 1 tablet (10 mg total) by mouth daily with breakfast. 30 tablet 3   glucose blood (ACCU-CHEK GUIDE) test strip Use to check blood sugars daily 100 each 2   hydrALAZINE (APRESOLINE) 25 MG tablet TAKE ONE TABLET BY MOUTH TWICE A DAY FOR BLOOD PRESSURE     Leuprolide  Acetate (LUPRON  DEPOT, 13-MONTH, IM) Inject into the muscle.     losartan  (COZAAR ) 25 MG tablet Take 1 tablet (25 mg total) by mouth daily. 90 tablet 3   metFORMIN  (GLUCOPHAGE -XR) 500 MG 24 hr tablet Take 1 tab in the morning and 2 tabs in the evening. 135 tablet 3   MYRBETRIQ 50 MG TB24 tablet Take 1 tablet by mouth daily.     pioglitazone  (ACTOS ) 30 MG tablet TAKE 1 TABLET(30 MG) BY MOUTH DAILY 90 tablet 0   vitamin B-12 (CYANOCOBALAMIN ) 500 MCG tablet Take 2 tablets by mouth daily.     trimethoprim -polymyxin b  (POLYTRIM ) ophthalmic solution Apply 1-2 drops into affected  eye QID x 5 days. (Patient not taking: Reported on 11/02/2023) 10 mL 0   No current facility-administered medications for this visit.    PHYSICAL EXAM: Vitals:   01/30/24 0811 01/30/24 0812  BP: (!) 150/62 138/60  Pulse: 61   Resp: 20   SpO2: 94%   Weight: 195 lb (88.5 kg)   Height: 5\' 10"  (1.778 m)      Body mass index is 27.98 kg/m.  Wt Readings from Last 3 Encounters:  01/30/24 195 lb (88.5 kg)  11/02/23 195 lb 11.2 oz (  88.8 kg)  09/30/23 201 lb (91.2 kg)    General: Well developed, well nourished male in no apparent distress.  HEENT: AT/[place], no external lesions.  Eyes: Conjunctiva clear and no icterus. Neck: Neck supple  Lungs: Respirations not labored Neurologic: Alert, oriented, normal speech Extremities / Skin: Dry.  Psychiatric: Does not appear depressed or anxious  Diabetic Foot Exam - Simple   No data filed    LABS Reviewed Lab Results  Component Value Date   HGBA1C 6.0 (A) 01/30/2024   HGBA1C 6.4 (H) 09/22/2023   HGBA1C 6.4 05/16/2023   Lab Results  Component Value Date   FRUCTOSAMINE 228 01/30/2020   Lab Results  Component Value Date   CHOL 187 09/22/2023   HDL 78 09/22/2023   LDLCALC 89 09/22/2023   LDLDIRECT 84.0 03/30/2021   TRIG 105 09/22/2023   CHOLHDL 2.4 09/22/2023   Lab Results  Component Value Date   MICRALBCREAT 26.8 09/15/2023   MICRALBCREAT 12.0 08/17/2022   Lab Results  Component Value Date   CREATININE 1.51 (H) 10/26/2023   Lab Results  Component Value Date   GFR 48.60 (L) 06/01/2023    ASSESSMENT / PLAN  1. Controlled type 2 diabetes mellitus with complication, without long-term current use of insulin (HCC)     Diabetes Mellitus type 2, complicated by CKD/microalbuminuria. - Diabetic status / severity: Controlled  Lab Results  Component Value Date   HGBA1C 6.0 (A) 01/30/2024    - Hemoglobin A1c goal : <6.5%  - Medications: See below, no change.  I) continue metformin  500 mg extended release 1 tablet in the  morning and 2 tablets in the evening. II) continue Jardiance  10 mg daily. III) continue pioglitazone /Actos  30 mg daily.  - Home glucose testing: In the morning fasting daily. - Discussed/ Gave Hypoglycemia treatment plan.  # Consult : not required at this time.   # Annual urine for microalbuminuria/ creatinine ratio, no microalbuminuria currently, currently , on ACE/ARB /losartan . Urine microalbumin creatinine ratio done at [facility] was normal on September 15, 2023. Consider referral to nephrology if renal function declines in the future visits.  Last  Lab Results  Component Value Date   MICRALBCREAT 26.8 09/15/2023    # Foot check nightly / neuropathy.  # Annual dilated diabetic eye exams.   - Diet: Make healthy diabetic food choices - Life style / activity / exercise: Discussed.  2. Blood pressure  -  BP Readings from Last 1 Encounters:  01/30/24 138/60    - Control is in target.  - No change in current plans.  3. Lipid status / Hyperlipidemia - Last  Lab Results  Component Value Date   LDLCALC 89 09/22/2023   - Continue atorvastatin  10 mg daily.  Diagnoses and all orders for this visit:  Controlled type 2 diabetes mellitus with complication, without long-term current use of insulin (HCC) -     POCT glycosylated hemoglobin (Hb A1C)    DISPOSITION Follow up in clinic in 5 months suggested.    All questions answered and patient verbalized understanding of the plan.  Iraq Callahan Peddie, MD [facility] Endocrinology [facility] [address] [place], [address] [place], [place] [address] Phone # [phone]  At least part of this note was generated using voice recognition software. Inadvertent word errors may have occurred, which were not recognized during the proofreading process.

## 2024-02-28 ENCOUNTER — Inpatient Hospital Stay: Attending: Physician Assistant

## 2024-02-28 DIAGNOSIS — C61 Malignant neoplasm of prostate: Secondary | ICD-10-CM | POA: Diagnosis not present

## 2024-02-28 DIAGNOSIS — Z5111 Encounter for antineoplastic chemotherapy: Secondary | ICD-10-CM | POA: Insufficient documentation

## 2024-02-28 LAB — CMP (CANCER CENTER ONLY)
ALT: 10 U/L (ref 0–44)
AST: 16 U/L (ref 15–41)
Albumin: 4 g/dL (ref 3.5–5.0)
Alkaline Phosphatase: 66 U/L (ref 38–126)
Anion gap: 6 (ref 5–15)
BUN: 19 mg/dL (ref 8–23)
CO2: 30 mmol/L (ref 22–32)
Calcium: 9.3 mg/dL (ref 8.9–10.3)
Chloride: 103 mmol/L (ref 98–111)
Creatinine: 1.38 mg/dL — ABNORMAL HIGH (ref 0.61–1.24)
GFR, Estimated: 52 mL/min — ABNORMAL LOW (ref >60)
Glucose, Bld: 104 mg/dL — ABNORMAL HIGH (ref 70–99)
Potassium: 4.7 mmol/L (ref 3.5–5.1)
Sodium: 139 mmol/L (ref 135–145)
Total Bilirubin: 0.7 mg/dL (ref 0.0–1.2)
Total Protein: 6.5 g/dL (ref 6.5–8.1)

## 2024-02-28 LAB — CBC WITH DIFFERENTIAL (CANCER CENTER ONLY)
Abs Immature Granulocytes: 0.02 10*3/uL (ref 0.00–0.07)
Basophils Absolute: 0.1 10*3/uL (ref 0.0–0.1)
Basophils Relative: 1 %
Eosinophils Absolute: 0.1 10*3/uL (ref 0.0–0.5)
Eosinophils Relative: 2 %
HCT: 39.2 % (ref 39.0–52.0)
Hemoglobin: 12.9 g/dL — ABNORMAL LOW (ref 13.0–17.0)
Immature Granulocytes: 0 %
Lymphocytes Relative: 33 %
Lymphs Abs: 1.9 10*3/uL (ref 0.7–4.0)
MCH: 32.3 pg (ref 26.0–34.0)
MCHC: 32.9 g/dL (ref 30.0–36.0)
MCV: 98 fL (ref 80.0–100.0)
Monocytes Absolute: 0.7 10*3/uL (ref 0.1–1.0)
Monocytes Relative: 12 %
Neutro Abs: 2.9 10*3/uL (ref 1.7–7.7)
Neutrophils Relative %: 52 %
Platelet Count: 201 10*3/uL (ref 150–400)
RBC: 4 MIL/uL — ABNORMAL LOW (ref 4.22–5.81)
RDW: 12.6 % (ref 11.5–15.5)
WBC Count: 5.6 10*3/uL (ref 4.0–10.5)
nRBC: 0 % (ref 0.0–0.2)

## 2024-02-29 LAB — TESTOSTERONE: Testosterone: 10 ng/dL — ABNORMAL LOW (ref 264–916)

## 2024-02-29 LAB — PROSTATE-SPECIFIC AG, SERUM (LABCORP): Prostate Specific Ag, Serum: <0.1 ng/mL (ref 0.0–4.0)

## 2024-03-06 ENCOUNTER — Other Ambulatory Visit: Payer: Self-pay | Admitting: Hematology and Oncology

## 2024-03-06 DIAGNOSIS — C61 Malignant neoplasm of prostate: Secondary | ICD-10-CM

## 2024-03-06 NOTE — Progress Notes (Unsigned)
 [facility] [phone]   [phone]  PROGRESS NOTE  Patient Care Team: Delana Corean GRADE, MD as PCP - General (Internal Medicine)  Hematological/Oncological History # Advanced Castrate Sensitive Prostate Cancer  11/25/2004 : diagnosed with Prostate cancer, underwent retropubic prostatectomy and bilateral lymph node dissection. The pathology showed a Gleason score of 4+4 equals 8 with staging of T2b N0.  3-12/2005: salvage radiation therapy for a rise in his PSA to 0.29. He received total of 65 gray in 35 fractions.  2016: castration-sensitive with biochemical relapse. Gleason score 4+3 equal 7 and a PSA of 3.66 at the time  06/17/2022: last visit with Dr. Amadeo.  10/28/2022: transfer care to Dr. Federico   Interval History:  Richard [name] 78 y.o. male with medical history significant for castrate sensitive prostate cancer who presents for a follow up visit. The patient's last visit was on 11/02/2023. In the interim since the last visit he has had no major changes in his health.  On exam today Richard [name] reports he has been well overall in the interim since her last visit.  Over the last 4 months he said no changes in his health.  He said no new medications.  His wife is concerned that he is depressed, though he reports he has had deaths in the family due to cancer.  He reports he is tolerating his Lupron  shots well with no major side effects.  He is not having any sweats, hot flashes, or other concerning symptoms.  He reports that he does have some occasional incontinence and urinary issues.  He is currently being followed by urology.  He notes that he is not having any new bone or back pain.  He reports his energy today is about a 7 out of 10.  He reports he is not feeling like he has a lot of energy and is spending a lot of time sleeping.  He otherwise denies any fevers, chills, sweats, nausea, vomiting or diarrhea.  A full 10 point ROS is otherwise  negative.  MEDICAL HISTORY:  Past Medical History:  Diagnosis Date   Diabetes mellitus without complication (HCC)    Hypertension    prostate ca dx'd 2006   prostatectomy and xrt    SURGICAL HISTORY: No past surgical history on file.  SOCIAL HISTORY: Social History   Socioeconomic History   Marital status: Married    Spouse name: Not on file   Number of children: Not on file   Years of education: Not on file   Highest education level: Not on file  Occupational History   Not on file  Tobacco Use   Smoking status: Every Day   Smokeless tobacco: Never  Substance and Sexual Activity   Alcohol use: Not on file   Drug use: Not on file   Sexual activity: Not on file  Other Topics Concern   Not on file  Social History Narrative   Not on file   Social Drivers of Health   Financial Resource Strain: Low Risk  (09/12/2023)   Received from [facility]   Overall Financial Resource Strain (CARDIA)    Difficulty of Paying Living Expenses: Not hard at all  Food Insecurity: No Food Insecurity (09/12/2023)   Received from [facility]   Hunger Vital Sign    Within the past 12 months, you worried that your food would run out before you got the money to buy more.: Never true    Within the past 12 months, the  food you bought just didn't last and you didn't have money to get more.: Never true  Transportation Needs: No Transportation Needs (09/12/2023)   Received from [facility]   PRAPARE - Transportation    Lack of Transportation (Medical): No    Lack of Transportation (Non-Medical): No  Physical Activity: Not on file  Stress: No Stress Concern Present (09/12/2023)   Received from [facility] - Occupational Stress Questionnaire    Feeling of Stress : Not at all  Social Connections: Socially Integrated (09/12/2023)   Received from [facility]   Social Network    How would you rate your social network (family, work, friends)?: Good  participation with social networks  Intimate Partner Violence: Not At Risk (09/12/2023)   Received from [facility]   HITS    Over the last 12 months how often did your partner physically hurt you?: Never    Over the last 12 months how often did your partner insult you or talk down to you?: Never    Over the last 12 months how often did your partner threaten you with physical harm?: Never    Over the last 12 months how often did your partner scream or curse at you?: Never    FAMILY HISTORY: Family History  Problem Relation Age of Onset   Hypertension Father    Heart disease Neg Hx    Diabetes Neg Hx     ALLERGIES:  is allergic to effexor  xr [venlafaxine  hcl].  MEDICATIONS:  Current Outpatient Medications  Medication Sig Dispense Refill   amLODipine  (NORVASC ) 10 MG tablet Take 1 tablet (10 mg total) by mouth daily. (Patient taking differently: Take 10 mg by mouth daily. Take 1/2 tablet daily) 90 tablet 2   atorvastatin  (LIPITOR) 10 MG tablet TAKE 1 TABLET(10 MG) BY MOUTH DAILY 90 tablet 0   Blood Glucose Monitoring Suppl (ACCU-CHEK GUIDE) w/Device KIT Use to check blood sugars daily 1 kit 0   empagliflozin  (JARDIANCE ) 10 MG TABS tablet Take 1 tablet (10 mg total) by mouth daily with breakfast. 30 tablet 3   glucose blood (ACCU-CHEK GUIDE) test strip Use to check blood sugars daily 100 each 2   hydrALAZINE (APRESOLINE) 25 MG tablet TAKE ONE TABLET BY MOUTH TWICE A DAY FOR BLOOD PRESSURE     Leuprolide  Acetate (LUPRON  DEPOT, 26-MONTH, IM) Inject into the muscle.     losartan  (COZAAR ) 25 MG tablet Take 1 tablet (25 mg total) by mouth daily. 90 tablet 3   metFORMIN  (GLUCOPHAGE -XR) 500 MG 24 hr tablet Take 1 tab in the morning and 2 tabs in the evening. 135 tablet 3   MYRBETRIQ 50 MG TB24 tablet Take 1 tablet by mouth daily.     pioglitazone  (ACTOS ) 30 MG tablet TAKE 1 TABLET(30 MG) BY MOUTH DAILY 90 tablet 0   trimethoprim -polymyxin b  (POLYTRIM ) ophthalmic solution Apply 1-2 drops into  affected eye QID x 5 days. (Patient not taking: Reported on 11/02/2023) 10 mL 0   vitamin B-12 (CYANOCOBALAMIN ) 500 MCG tablet Take 2 tablets by mouth daily.     No current facility-administered medications for this visit.    REVIEW OF SYSTEMS:   Constitutional: ( - ) fevers, ( - )  chills , ( - ) night sweats Eyes: ( - ) blurriness of vision, ( - ) double vision, ( - ) watery eyes Ears, nose, mouth, throat, and face: ( - ) mucositis, ( - ) sore throat Respiratory: ( - )  cough, ( - ) dyspnea, ( - ) wheezes Cardiovascular: ( - ) palpitation, ( - ) chest discomfort, ( - ) lower extremity swelling Gastrointestinal:  ( - ) nausea, ( - ) heartburn, ( - ) change in bowel habits Skin: ( - ) abnormal skin rashes Lymphatics: ( - ) new lymphadenopathy, ( - ) easy bruising Neurological: ( - ) numbness, ( - ) tingling, ( - ) new weaknesses Behavioral/Psych: ( - ) mood change, ( - ) new changes  All other systems were reviewed with the patient and are negative.  PHYSICAL EXAMINATION: ECOG PERFORMANCE STATUS: 1 - Symptomatic but completely ambulatory  There were no vitals filed for this visit.    There were no vitals filed for this visit.  GENERAL: Well-appearing elderly Caucasian male, alert, no distress and comfortable SKIN: skin color, texture, turgor are normal, no rashes or significant lesions EYES: conjunctiva are pink and non-injected, sclera clear LUNGS: clear to auscultation and percussion with normal breathing effort HEART: regular rate & rhythm and no murmurs and no lower extremity edema Musculoskeletal: no cyanosis of digits and no clubbing  PSYCH: alert & oriented x 3, fluent speech NEURO: no focal motor/sensory deficits  LABORATORY DATA:  I have reviewed the data as listed    Latest Ref Rng & Units 02/28/2024    7:30 AM 10/26/2023    8:34 AM 06/29/2023    2:07 PM  CBC  WBC 4.0 - 10.5 K/uL 5.6  5.0  6.7   Hemoglobin 13.0 - 17.0 g/dL 87.0  87.3  86.9   Hematocrit 39.0 - 52.0 %  39.2  40.0  40.2   Platelets 150 - 400 K/uL 201  212  219        Latest Ref Rng & Units 02/28/2024    7:30 AM 10/26/2023    8:34 AM 09/22/2023    8:12 AM  CMP  Glucose 70 - 99 mg/dL 895  882  886   BUN 8 - 23 mg/dL 19  27  25    Creatinine 0.61 - 1.24 mg/dL 8.61  8.48  8.51   Sodium 135 - 145 mmol/L 139  141  140   Potassium 3.5 - 5.1 mmol/L 4.7  4.6  5.3   Chloride 98 - 111 mmol/L 103  107  104   CO2 22 - 32 mmol/L 30  29  27    Calcium  8.9 - 10.3 mg/dL 9.3  9.3  9.8   Total Protein 6.5 - 8.1 g/dL 6.5  6.3    Total Bilirubin 0.0 - 1.2 mg/dL 0.7  0.5    Alkaline Phos 38 - 126 U/L 66  59    AST 15 - 41 U/L 16  14    ALT 0 - 44 U/L 10  10     RADIOGRAPHIC STUDIES: No results found.  ASSESSMENT & PLAN MCCADE SULLENBERGER 78 y.o. male with medical history significant for castrate sensitive prostate cancer who presents for a follow up visit.   # Advanced Castrate Sensitive Prostate Cancer  -- continue eligard  30 mg q 4 months Subq -- labs show white blood cell 5.6, Hgb 12.9, MCV 98, Plt 201. LFTs in range. PSA was <0.1, testosterone  10 --No clear indication for imaging at this time. -- RTC in 4 months for repeat clinic visit and continued eligard .   No orders of the defined types were placed in this encounter.   All questions were answered. The patient knows to call the clinic with any problems, questions  or concerns.   I have spent a total of 30 minutes minutes of face-to-face and non-face-to-face time, preparing to see the patient, performing a medically appropriate examination, counseling and educating the patient, documenting clinical information in the electronic health record, independently interpreting results and communicating results to the patient, and care coordination.   Richard IVAR Kidney, MD Department of Hematology/Oncology [facility] at [facility] Phone: [phone] Pager: [phone] Email: [email or web address]    03/06/2024 7:42 PM

## 2024-03-07 ENCOUNTER — Inpatient Hospital Stay: Admitting: Hematology and Oncology

## 2024-03-07 ENCOUNTER — Inpatient Hospital Stay

## 2024-03-07 VITALS — BP 131/58 | HR 68 | Temp 97.6°F | Resp 17 | Ht 70.0 in | Wt 195.6 lb

## 2024-03-07 DIAGNOSIS — Z5111 Encounter for antineoplastic chemotherapy: Secondary | ICD-10-CM | POA: Diagnosis not present

## 2024-03-07 DIAGNOSIS — C61 Malignant neoplasm of prostate: Secondary | ICD-10-CM | POA: Diagnosis not present

## 2024-03-07 DIAGNOSIS — Z8546 Personal history of malignant neoplasm of prostate: Secondary | ICD-10-CM | POA: Diagnosis not present

## 2024-03-07 MED ORDER — LEUPROLIDE ACETATE (4 MONTH) 30 MG ~~LOC~~ KIT
30.0000 mg | PACK | Freq: Once | SUBCUTANEOUS | Status: AC
  Administered 2024-03-07: 30 mg via SUBCUTANEOUS
  Filled 2024-03-07: qty 30

## 2024-04-12 ENCOUNTER — Encounter (HOSPITAL_BASED_OUTPATIENT_CLINIC_OR_DEPARTMENT_OTHER): Payer: Self-pay

## 2024-04-12 ENCOUNTER — Encounter: Payer: Self-pay | Admitting: Hematology and Oncology

## 2024-04-12 ENCOUNTER — Emergency Department (HOSPITAL_BASED_OUTPATIENT_CLINIC_OR_DEPARTMENT_OTHER)
Admission: EM | Admit: 2024-04-12 | Discharge: 2024-04-12 | Disposition: A | Discharge status: Home / Self Care | Attending: Emergency Medicine | Admitting: Emergency Medicine

## 2024-04-12 ENCOUNTER — Other Ambulatory Visit: Payer: Self-pay

## 2024-04-12 DIAGNOSIS — T63441A Toxic effect of venom of bees, accidental (unintentional), initial encounter: Secondary | ICD-10-CM | POA: Insufficient documentation

## 2024-04-12 NOTE — ED Provider Notes (Signed)
 [facility]ER[facility]ENC[facility] DEPARTMENT AT [facility] Provider Note   CSN: [id number] Arrival date & time: 04/12/24  1350     Patient presents with: Insect Bite   Richard [name] is a 78 y.o. male who presents at for concern for a sting he received in the lateral aspect of his left eye.  He was doing yard work when he felt a sting to the left eye, this denies having any dyspnea or chest pain, denies having any changes in his vision.   HPI     Prior to Admission medications   Medication Sig Start Date End Date Taking? Authorizing Provider  amLODipine (NORVASC) 10 MG tablet Take 1 tablet (10 mg total) by mouth daily. Patient taking differently: Take 10 mg by mouth daily. Take 1/2 tablet daily 05/06/21   [place], MD  atorvastatin (LIPITOR) 10 MG tablet TAKE 1 TABLET(10 MG) BY MOUTH DAILY 01/14/17   [place], MD  Blood Glucose Monitoring Suppl (ACCU-CHEK GUIDE) w/Device KIT Use to check blood sugars daily 04/10/21   [place], MD  empagliflozin (JARDIANCE) 10 MG TABS tablet Take 1 tablet (10 mg total) by mouth daily with breakfast. 10/28/22   [place], MD  glucose blood (ACCU-CHEK GUIDE) test strip Use to check blood sugars daily 04/10/21   [place], MD  hydrALAZINE (APRESOLINE) 25 MG tablet TAKE ONE TABLET BY MOUTH TWICE A DAY FOR BLOOD PRESSURE 08/05/22   [provider]  Leuprolide Acetate (LUPRON DEPOT, 88-MONTH, IM) Inject into the muscle.    [provider]  losartan (COZAAR) 25 MG tablet Take 1 tablet (25 mg total) by mouth daily. 05/18/23   Thapa, [place], MD  metFORMIN (GLUCOPHAGE-XR) 500 MG 24 hr tablet Take 1 tab in the morning and 2 tabs in the evening. 05/18/23   Thapa, [place], MD  MYRBETRIQ 50 MG TB24 tablet Take 1 tablet by mouth daily. 09/22/21   [provider]  pioglitazone (ACTOS) 30 MG tablet TAKE 1 TABLET(30 MG) BY MOUTH DAILY 10/08/16   [place]on [place], MD  trimethoprim-polymyxin b (POLYTRIM) ophthalmic solution Apply 1-2 drops into affected  eye QID x 5 days. Patient not taking: Reported on 11/02/2023 10/19/23   Gladis Elsie BROCKS, PA-C  vitamin B-12 (CYANOCOBALAMIN) 500 MCG tablet Take 2 tablets by mouth daily. 09/22/21   [provider]    Allergies: Effexor xr [venlafaxine hcl]    Review of Systems  Eyes:  Positive for pain.  All other systems reviewed and are negative.   Updated Vital Signs BP (!) 140/64 (BP Location: Left Arm)   Pulse 69   Temp 98.1 F (36.7 C) (Oral)   Resp 18   Ht 5' 10 (1.778 m)   Wt 86.2 kg   SpO2 96%   BMI 27.26 kg/m   Physical Exam Vitals and nursing note reviewed.  Constitutional:      General: He is not in acute distress.    Appearance: He is well-developed.  HENT:     Head: Normocephalic and atraumatic.  Eyes:     General: Lids are normal. Vision grossly intact. Gaze aligned appropriately.     Conjunctiva/sclera: Conjunctivae normal.     Comments: On the superior lid at the lateral aspect of the left eye, there is a small piece stinger that is intact with a venom sac intact.  Cardiovascular:     Rate and Rhythm: Normal rate and regular rhythm.     Heart sounds: No murmur heard. Pulmonary:     Effort: Pulmonary  effort is normal. No respiratory distress.     Breath sounds: Normal breath sounds.  Abdominal:     Palpations: Abdomen is soft.     Tenderness: There is no abdominal tenderness.  Musculoskeletal:        General: No swelling.     Cervical back: Neck supple.  Skin:    General: Skin is warm and dry.     Capillary Refill: Capillary refill takes less than 2 seconds.  Neurological:     Mental Status: He is alert.  Psychiatric:        Mood and Affect: Mood normal.     (all labs ordered are listed, but only abnormal results are displayed) Labs Reviewed - No data to display  EKG: None  Radiology: No results found.   Procedures   Medications Ordered in the ED - No data to display                                  Medical Decision Making  After  assessing this patient, he does not show any signs consistent with an allergic reaction to hymenoptera sting.  He has no shortness of breath, has no chest pain, and does not appear with any skin signs or symptoms.  Further, there is an isolated stinger that is intact in the left eye as noted in the physical exam.  Using forceps, easily removed this, patient only has 3/10 pain which she states is quickly subsiding.  Will continue to use acetaminophen as needed, discussed using diphenhydramine topical cream as needed for skin symptoms, and to follow-up with primary care as needed for monitoring of his resolution.     Final diagnoses:  Bee sting, accidental or unintentional, initial encounter    ED Discharge Orders     None          Myriam Dorn BROCKS, [place] 04/12/24 1517    Armenta Canning, MD 04/24/24 1816

## 2024-04-12 NOTE — ED Notes (Signed)
Stinger removed by PA.

## 2024-04-12 NOTE — ED Triage Notes (Signed)
 Pt reports:  Bee sting [place] Left eye

## 2024-04-12 NOTE — ED Notes (Signed)
 RN reviewed discharge instructions with pt. Pt verbalized understanding and had no further questions. VSS upon discharge.

## 2024-05-10 ENCOUNTER — Telehealth: Admitting: Physician Assistant

## 2024-05-10 DIAGNOSIS — H00013 Hordeolum externum right eye, unspecified eyelid: Secondary | ICD-10-CM

## 2024-05-10 MED ORDER — ERYTHROMYCIN 5 MG/GM OP OINT
1.0000 | TOPICAL_OINTMENT | Freq: Every day | OPHTHALMIC | 0 refills | Status: AC
Start: 2024-05-10 — End: 2024-05-15

## 2024-05-10 NOTE — Progress Notes (Signed)
 Virtual Visit Consent   Richard [name], you are scheduled for a virtual visit with a [facility] provider today. Just as with appointments in the office, your consent must be obtained to participate. Your consent will be active for this visit and any virtual visit you may have with one of our providers in the next 365 days. If you have a MyChart account, a copy of this consent can be sent to you electronically.  As this is a virtual visit, video technology does not allow for your provider to perform a traditional examination. This may limit your provider's ability to fully assess your condition. If your provider identifies any concerns that need to be evaluated in person or the need to arrange testing (such as labs, EKG, etc.), we will make arrangements to do so. Although advances in technology are sophisticated, we cannot ensure that it will always work on either your end or our end. If the connection with a video visit is poor, the visit may have to be switched to a telephone visit. With either a video or telephone visit, we are not always able to ensure that we have a secure connection.  By engaging in this virtual visit, you consent to the provision of healthcare and authorize for your insurance to be billed (if applicable) for the services provided during this visit. Depending on your insurance coverage, you may receive a charge related to this service.  I need to obtain your verbal consent now. Are you willing to proceed with your visit today? Richard [name] has provided verbal consent on 05/10/2024 for a virtual visit (video or telephone). Delon CHRISTELLA Dickinson, PA-C  Date: 05/10/2024 10:07 AM   Virtual Visit via Video Note   I, Delon CHRISTELLA Dickinson, connected with  Richard [name]  ([id number], 28-Apr-1946) on 05/10/24 at 10:00 AM EDT by a video-enabled telemedicine application and verified that I am speaking with the correct person using two identifiers.  Location: Patient: Virtual Visit  Location Patient: Home Provider: Virtual Visit Location Provider: Home Office   I discussed the limitations of evaluation and management by telemedicine and the availability of in person appointments. The patient expressed understanding and agreed to proceed.    History of Present Illness: Richard [name] is a 78 y.o. who identifies as a male who was assigned male at birth, and is being seen today for stye on the right lower eye lid.  HPI: Eye Problem  The right eye is affected. This is a new problem. The current episode started in the past 7 days (5 days). The problem occurs constantly. There was no injury mechanism. The patient is experiencing no pain. There is No known exposure to pink eye. He Does not wear contacts. Associated symptoms include eye redness (of the lower lid). Pertinent negatives include no blurred vision, eye discharge, double vision, foreign body sensation or photophobia. He has tried nothing for the symptoms. The treatment provided no relief.     Problems:  Patient Active Problem List   Diagnosis Date Noted   Prostate cancer (HCC) 10/28/2022   Post traumatic stress disorder (PTSD) 07/15/2014   Vitamin B 12 deficiency 12/12/2013   H/O prostate cancer 12/12/2013   Essential hypertension 08/10/2013   Other and unspecified hyperlipidemia 08/10/2013   Controlled type 2 diabetes mellitus with complication, without long-term current use of insulin (HCC) 08/07/2013    Allergies:  Allergies  Allergen Reactions   Effexor  Xr [Venlafaxine  Hcl] Rash   Medications:  Current Outpatient Medications:  erythromycin  ophthalmic ointment, Place 1 Application into the right eye at bedtime for 5 days., Disp: 5 g, Rfl: 0   amLODipine  (NORVASC ) 10 MG tablet, Take 1 tablet (10 mg total) by mouth daily. (Patient taking differently: Take 10 mg by mouth daily. Take 1/2 tablet daily), Disp: 90 tablet, Rfl: 2   atorvastatin  (LIPITOR) 10 MG tablet, TAKE 1 TABLET(10 MG) BY MOUTH DAILY, Disp:  90 tablet, Rfl: 0   Blood Glucose Monitoring Suppl (ACCU-CHEK GUIDE) w/Device KIT, Use to check blood sugars daily, Disp: 1 kit, Rfl: 0   empagliflozin  (JARDIANCE ) 10 MG TABS tablet, Take 1 tablet (10 mg total) by mouth daily with breakfast., Disp: 30 tablet, Rfl: 3   glucose blood (ACCU-CHEK GUIDE) test strip, Use to check blood sugars daily, Disp: 100 each, Rfl: 2   hydrALAZINE (APRESOLINE) 25 MG tablet, TAKE ONE TABLET BY MOUTH TWICE A DAY FOR BLOOD PRESSURE, Disp: , Rfl:    Leuprolide  Acetate (LUPRON  DEPOT, 73-MONTH, IM), Inject into the muscle., Disp: , Rfl:    losartan  (COZAAR ) 25 MG tablet, Take 1 tablet (25 mg total) by mouth daily., Disp: 90 tablet, Rfl: 3   metFORMIN  (GLUCOPHAGE -XR) 500 MG 24 hr tablet, Take 1 tab in the morning and 2 tabs in the evening., Disp: 135 tablet, Rfl: 3   MYRBETRIQ 50 MG TB24 tablet, Take 1 tablet by mouth daily., Disp: , Rfl:    pioglitazone  (ACTOS ) 30 MG tablet, TAKE 1 TABLET(30 MG) BY MOUTH DAILY, Disp: 90 tablet, Rfl: 0   trimethoprim -polymyxin b  (POLYTRIM ) ophthalmic solution, Apply 1-2 drops into affected eye QID x 5 days. (Patient not taking: Reported on 11/02/2023), Disp: 10 mL, Rfl: 0   vitamin B-12 (CYANOCOBALAMIN ) 500 MCG tablet, Take 2 tablets by mouth daily., Disp: , Rfl:   Observations/Objective: Patient is well-developed, well-nourished in no acute distress.  Resting comfortably at home.  Head is normocephalic, atraumatic.  No labored breathing.  Speech is clear and coherent with logical content.  Patient is alert and oriented at baseline.  The right lower eyelid does have an area of redness and mild swelling noted about midline of the eyelid with redness surrounding stretching to the edges of the eyelid Conjunctiva is normal, EOM grossly intact, Pupils round and equal  Assessment and Plan: 1. Hordeolum externum of right eye, unspecified eyelid (Primary) - erythromycin  ophthalmic ointment; Place 1 Application into the right eye at bedtime for  5 days.  Dispense: 5 g; Refill: 0  - Suspect stye vs chalazion - Erythromycin  ointment prescribed - Warm compresses - Good hand hygiene - Seek in person evaluation if symptoms worsen or fail to improve   Follow Up Instructions: I discussed the assessment and treatment plan with the patient. The patient was provided an opportunity to ask questions and all were answered. The patient agreed with the plan and demonstrated an understanding of the instructions.  A copy of instructions were sent to the patient via MyChart unless otherwise noted below.    The patient was advised to call back or seek an in-person evaluation if the symptoms worsen or if the condition fails to improve as anticipated.    Delon CHRISTELLA Dickinson, PA-C

## 2024-05-10 NOTE — Patient Instructions (Signed)
 Richard [name], thank you for joining Delon CHRISTELLA Dickinson, PA-C for today's virtual visit.  While this provider is not your primary care provider (PCP), if your PCP is located in our provider database this encounter information will be shared with them immediately following your visit.   A [facility] MyChart account gives you access to today's visit and all your visits, tests, and labs performed at [facility]  click here if you don't have a [facility] MyChart account or go to [email or web address]  Consent: (Patient) Richard [name] provided verbal consent for this virtual visit at the beginning of the encounter.  Current Medications:  Current Outpatient Medications:    erythromycin  ophthalmic ointment, Place 1 Application into the right eye at bedtime for 5 days., Disp: 5 g, Rfl: 0   amLODipine  (NORVASC ) 10 MG tablet, Take 1 tablet (10 mg total) by mouth daily. (Patient taking differently: Take 10 mg by mouth daily. Take 1/2 tablet daily), Disp: 90 tablet, Rfl: 2   atorvastatin  (LIPITOR) 10 MG tablet, TAKE 1 TABLET(10 MG) BY MOUTH DAILY, Disp: 90 tablet, Rfl: 0   Blood Glucose Monitoring Suppl (ACCU-CHEK GUIDE) w/Device KIT, Use to check blood sugars daily, Disp: 1 kit, Rfl: 0   empagliflozin  (JARDIANCE ) 10 MG TABS tablet, Take 1 tablet (10 mg total) by mouth daily with breakfast., Disp: 30 tablet, Rfl: 3   glucose blood (ACCU-CHEK GUIDE) test strip, Use to check blood sugars daily, Disp: 100 each, Rfl: 2   hydrALAZINE (APRESOLINE) 25 MG tablet, TAKE ONE TABLET BY MOUTH TWICE A DAY FOR BLOOD PRESSURE, Disp: , Rfl:    Leuprolide  Acetate (LUPRON  DEPOT, 10-MONTH, IM), Inject into the muscle., Disp: , Rfl:    losartan  (COZAAR ) 25 MG tablet, Take 1 tablet (25 mg total) by mouth daily., Disp: 90 tablet, Rfl: 3   metFORMIN  (GLUCOPHAGE -XR) 500 MG 24 hr tablet, Take 1 tab in the morning and 2 tabs in the evening., Disp: 135 tablet, Rfl: 3   MYRBETRIQ 50 MG TB24 tablet, Take 1 tablet  by mouth daily., Disp: , Rfl:    pioglitazone  (ACTOS ) 30 MG tablet, TAKE 1 TABLET(30 MG) BY MOUTH DAILY, Disp: 90 tablet, Rfl: 0   trimethoprim -polymyxin b  (POLYTRIM ) ophthalmic solution, Apply 1-2 drops into affected eye QID x 5 days. (Patient not taking: Reported on 11/02/2023), Disp: 10 mL, Rfl: 0   vitamin B-12 (CYANOCOBALAMIN ) 500 MCG tablet, Take 2 tablets by mouth daily., Disp: , Rfl:    Medications ordered in this encounter:  Meds ordered this encounter  Medications   erythromycin  ophthalmic ointment    Sig: Place 1 Application into the right eye at bedtime for 5 days.    Dispense:  5 g    Refill:  0    Supervising Provider:   BLAISE ALEENE KIDD [[id number]]     *If you need refills on other medications prior to your next appointment, please contact your pharmacy*  Follow-Up: Call back or seek an in-person evaluation if the symptoms worsen or if the condition fails to improve as anticipated.  [facility] [phone]  Other Instructions Chalazion  A chalazion is a swelling or lump on the eyelid. It can affect the upper eyelid or the lower eyelid. What are the causes? This condition may be caused by: Long-lasting (chronic) inflammation of the eyelid glands. A blocked oil gland in the eyelid. What are the signs or symptoms? Symptoms of this condition include: Swelling of the eyelid that: May spread to areas around the  eye. May be painful. A hard lump on the eyelid. Blurry vision. The lump may make it hard to see out of the eye. How is this diagnosed? This condition is diagnosed with an examination of the eye. How is this treated? This condition is treated by applying a warm, moist cloth (warm compress) to the eyelid. If the condition does not improve, it may be treated with: Medicine that is applied to the eye. Oral medicines. Medicine that is injected into the chalazion. Surgery. Follow these instructions at home: Managing pain and swelling Apply a warm  compress to the eyelid for 10-15 minutes, 4 to 6 times a day. This will help to open any blocked glands and to reduce redness and swelling. Take and apply over-the-counter and prescription medicines only as told by your health care provider. General instructions Do not touch the chalazion. Do not try to remove the pus. Do not squeeze the chalazion or stick it with a pin or needle. Do not rub your eyes. Wash your hands often with soap and water for at least 20 seconds. Dry your hands with a clean towel. Keep your face, scalp, and eyebrows clean. Avoid wearing eye makeup. Keep all follow-up visits. This is important. Contact a health care provider if: Your eyelid is getting worse. You have a fever. The chalazion does not break open (rupture) or go away on its own and your eyelid has not improved for 4 weeks. Get help right away if: You have pain in your eye. Your vision worsens. The chalazion becomes painful or red. The chalazion gets bigger. Summary A chalazion is a swelling or lump on the upper or lower eyelid. It may be caused by chronic inflammation or a blocked oil gland. Apply a warm compress to the eyelid for 10-15 minutes, 4 to 6 times a day. Keep your face, scalp, and eyebrows clean. This information is not intended to replace advice given to you by your health care provider. Make sure you discuss any questions you have with your health care provider. Document Revised: 10/22/2020 Document Reviewed: 10/22/2020 Elsevier Patient Education  2024 Elsevier Inc.   If you have been instructed to have an in-person evaluation today at a local Urgent Care facility, please use the link below. It will take you to a list of all of our available [facility], including address, phone number and hours of operation. Please do not delay care.  [facility]  If you or a family member do not have a primary care provider, use the link below to schedule a visit and establish  care. When you choose a [facility] primary care physician or advanced practice provider, you gain a long-term partner in health. Find a Primary Care Provider  Learn more about [facility]'s in-office and virtual care options: [facility] - Get Care Now

## 2024-05-31 ENCOUNTER — Other Ambulatory Visit (HOSPITAL_COMMUNITY): Payer: Self-pay | Admitting: Internal Medicine

## 2024-05-31 DIAGNOSIS — R413 Other amnesia: Secondary | ICD-10-CM

## 2024-06-08 ENCOUNTER — Ambulatory Visit (HOSPITAL_COMMUNITY)
Admission: RE | Admit: 2024-06-08 | Discharge: 2024-06-08 | Disposition: A | Discharge status: Home / Self Care | Source: Ambulatory Visit | Attending: Internal Medicine | Admitting: Internal Medicine

## 2024-06-08 DIAGNOSIS — R413 Other amnesia: Secondary | ICD-10-CM | POA: Insufficient documentation

## 2024-06-08 MED ORDER — GADOBUTROL 1 MMOL/ML IV SOLN
8.0000 mL | Freq: Once | INTRAVENOUS | Status: AC | PRN
  Administered 2024-06-08: 8 mL via INTRAVENOUS

## 2024-06-13 DIAGNOSIS — R399 Unspecified symptoms and signs involving the genitourinary system: Secondary | ICD-10-CM | POA: Diagnosis not present

## 2024-06-14 DIAGNOSIS — R350 Frequency of micturition: Secondary | ICD-10-CM | POA: Diagnosis not present

## 2024-06-14 DIAGNOSIS — R351 Nocturia: Secondary | ICD-10-CM | POA: Diagnosis not present

## 2024-06-14 DIAGNOSIS — N3281 Overactive bladder: Secondary | ICD-10-CM | POA: Diagnosis not present

## 2024-06-15 ENCOUNTER — Encounter: Payer: Self-pay | Admitting: Psychology

## 2024-07-03 ENCOUNTER — Ambulatory Visit: Admitting: Endocrinology

## 2024-07-12 ENCOUNTER — Ambulatory Visit: Admitting: Endocrinology

## 2024-07-12 ENCOUNTER — Other Ambulatory Visit: Payer: Self-pay | Admitting: *Deleted

## 2024-07-12 ENCOUNTER — Encounter: Payer: Self-pay | Admitting: Endocrinology

## 2024-07-12 ENCOUNTER — Inpatient Hospital Stay: Attending: Hematology and Oncology

## 2024-07-12 VITALS — BP 108/50 | HR 74 | Resp 20 | Ht 70.0 in | Wt 195.2 lb

## 2024-07-12 DIAGNOSIS — E118 Type 2 diabetes mellitus with unspecified complications: Secondary | ICD-10-CM

## 2024-07-12 DIAGNOSIS — Z7984 Long term (current) use of oral hypoglycemic drugs: Secondary | ICD-10-CM | POA: Diagnosis not present

## 2024-07-12 DIAGNOSIS — M8580 Other specified disorders of bone density and structure, unspecified site: Secondary | ICD-10-CM | POA: Diagnosis not present

## 2024-07-12 DIAGNOSIS — Z9079 Acquired absence of other genital organ(s): Secondary | ICD-10-CM | POA: Insufficient documentation

## 2024-07-12 DIAGNOSIS — Z79899 Other long term (current) drug therapy: Secondary | ICD-10-CM | POA: Insufficient documentation

## 2024-07-12 DIAGNOSIS — C61 Malignant neoplasm of prostate: Secondary | ICD-10-CM | POA: Insufficient documentation

## 2024-07-12 DIAGNOSIS — Z5111 Encounter for antineoplastic chemotherapy: Secondary | ICD-10-CM | POA: Insufficient documentation

## 2024-07-12 LAB — CMP (CANCER CENTER ONLY)
ALT: 11 U/L (ref 0–44)
AST: 16 U/L (ref 15–41)
Albumin: 4.1 g/dL (ref 3.5–5.0)
Alkaline Phosphatase: 57 U/L (ref 38–126)
Anion gap: 7 (ref 5–15)
BUN: 31 mg/dL — ABNORMAL HIGH (ref 8–23)
CO2: 27 mmol/L (ref 22–32)
Calcium: 9.3 mg/dL (ref 8.9–10.3)
Chloride: 104 mmol/L (ref 98–111)
Creatinine: 1.55 mg/dL — ABNORMAL HIGH (ref 0.61–1.24)
GFR, Estimated: 45 mL/min — ABNORMAL LOW (ref >60)
Glucose, Bld: 119 mg/dL — ABNORMAL HIGH (ref 70–99)
Potassium: 4.7 mmol/L (ref 3.5–5.1)
Sodium: 138 mmol/L (ref 135–145)
Total Bilirubin: 0.4 mg/dL (ref 0.0–1.2)
Total Protein: 6.4 g/dL — ABNORMAL LOW (ref 6.5–8.1)

## 2024-07-12 LAB — CBC WITH DIFFERENTIAL (CANCER CENTER ONLY)
Abs Immature Granulocytes: 0.03 K/uL (ref 0.00–0.07)
Basophils Absolute: 0.1 K/uL (ref 0.0–0.1)
Basophils Relative: 1 %
Eosinophils Absolute: 0.1 K/uL (ref 0.0–0.5)
Eosinophils Relative: 1 %
HCT: 40 % (ref 39.0–52.0)
Hemoglobin: 13.4 g/dL (ref 13.0–17.0)
Immature Granulocytes: 0 %
Lymphocytes Relative: 28 %
Lymphs Abs: 1.9 K/uL (ref 0.7–4.0)
MCH: 32.7 pg (ref 26.0–34.0)
MCHC: 33.5 g/dL (ref 30.0–36.0)
MCV: 97.6 fL (ref 80.0–100.0)
Monocytes Absolute: 0.8 K/uL (ref 0.1–1.0)
Monocytes Relative: 11 %
Neutro Abs: 3.9 K/uL (ref 1.7–7.7)
Neutrophils Relative %: 59 %
Platelet Count: 223 K/uL (ref 150–400)
RBC: 4.1 MIL/uL — ABNORMAL LOW (ref 4.22–5.81)
RDW: 13 % (ref 11.5–15.5)
WBC Count: 6.8 K/uL (ref 4.0–10.5)
nRBC: 0 % (ref 0.0–0.2)

## 2024-07-12 LAB — PSA: Prostatic Specific Antigen: <0.02 ng/mL (ref 0.00–4.00)

## 2024-07-12 MED ORDER — METFORMIN HCL ER 500 MG PO TB24: 500.0000 mg | ORAL_TABLET | Freq: Two times a day (BID) | ORAL | 3 refills | Status: AC

## 2024-07-12 MED ORDER — EMPAGLIFLOZIN 25 MG PO TABS: ORAL_TABLET | ORAL | 4 refills | Status: AC

## 2024-07-12 MED ORDER — PIOGLITAZONE HCL 30 MG PO TABS: ORAL_TABLET | ORAL | 3 refills | Status: AC

## 2024-07-12 NOTE — Progress Notes (Signed)
 Outpatient Endocrinology Note Si Jachim, MD  07/12/24  Patient's Name: Richard [name]    DOB: [birth year]    MRN: [id number]                                                    REASON OF VISIT: Follow up for type 2 diabetes mellitus  PCP: Delana Corean GRADE, MD  HISTORY OF PRESENT ILLNESS:   Richard [name] is a 78 y.o. old male with past medical history listed below, is here for follow up of type 2 diabetes mellitus.    Pertinent Diabetes History: Patient was diagnosed with type 2 diabetes mellitus in 1995.  Hemoglobin A1c at that time was 9.6%.  He had been on various antidiabetic medications orals in the past.  He has generally well-controlled diabetes mellitus.    Chronic Diabetes Complications : Retinopathy: no. Last ophthalmology exam was done on annually, reportedly at [facility]. Nephropathy: yes, CKD IIIa, microalbuminuria.  Used to be on losartan  100 mg daily stopped due to hyperkalemia. Peripheral neuropathy: no Coronary artery disease: no Stroke: no  Relevant comorbidities and cardiovascular risk factors: Obesity: no Body mass index is 28.01 kg/m.  Hypertension: yes Hyperlipidemia. Yes, on statin.   Current / Home Diabetic regimen includes: Metformin  extended release 500 mg in the morning and 1000 mg in the evening.   Pioglitazone /Actos  30 mg daily. Jardiance  12.5 mg daily. ( 1/2 of 25 mg)  He is getting medications from the [facility].  Prior diabetic medications: Amaryl , caused hypoglycemia.  Glycemic data:   Freestyle libre [employer], not able to download in the clinic today.  Mostly acceptable blood sugar.  Reviewed from the meter.  1 episode of hypoglycemia with blood sugar in 50s few weeks ago.  Hypoglycemia: Patient has one hypoglycemic episodes. Patient has hypoglycemia awareness.  Factors modifying glucose control: 1.  Diabetic diet assessment: 3 meals a day however lighter meals.  Mostly lunch with the largest meal of the day.  2.  Staying active or  exercising: No formal exercise.  3.  Medication compliance: compliant all of the time.  # History of vitamin B12 deficiency on vitamin D B12 supplement. # History of prostate cancer status post prostatectomy and radiation treatment, on Lupron .  Interval history  Diabetes regimen as reviewed and noted above.  Hemoglobin A1c was 6.1% in September at [facility] clinic.  Glucometer data as reviewed above, mostly acceptable blood sugar.  He had blood work this morning with relatively stable renal function.  He reports lately having some memory issues otherwise no new complaints.  He is accompanied by wife in the clinic today.  REVIEW OF SYSTEMS As per history of present illness.   PAST MEDICAL HISTORY: Past Medical History:  Diagnosis Date   Diabetes mellitus without complication (HCC)    Hypertension    prostate ca dx'd 2006   prostatectomy and xrt    PAST SURGICAL HISTORY: History reviewed. No pertinent surgical history.  ALLERGIES: Allergies  Allergen Reactions   Effexor  Xr [Venlafaxine  Hcl] Rash    FAMILY HISTORY:  Family History  Problem Relation Age of Onset   Hypertension Father    Heart disease Neg Hx    Diabetes Neg Hx     SOCIAL HISTORY: Social History   Socioeconomic History   Marital status: Married    Spouse name: Not on  file   Number of children: Not on file   Years of education: Not on file   Highest education level: Not on file  Occupational History   Not on file  Tobacco Use   Smoking status: Every Day   Smokeless tobacco: Never  Vaping Use   Vaping status: Never Used  Substance and Sexual Activity   Alcohol use: Yes    Comment: occ   Drug use: Never   Sexual activity: Not on file  Other Topics Concern   Not on file  Social History Narrative   Not on file   Social Drivers of Health   Financial Resource Strain: Low Risk  (09/12/2023)   Received from [facility]   Overall Financial Resource Strain (CARDIA)    Difficulty of Paying Living  Expenses: Not hard at all  Food Insecurity: No Food Insecurity (09/12/2023)   Received from [facility]   Hunger Vital Sign    Within the past 12 months, you worried that your food would run out before you got the money to buy more.: Never true    Within the past 12 months, the food you bought just didn't last and you didn't have money to get more.: Never true  Transportation Needs: No Transportation Needs (09/12/2023)   Received from [facility] - Transportation    Lack of Transportation (Medical): No    Lack of Transportation (Non-Medical): No  Physical Activity: Not on file  Stress: No Stress Concern Present (09/12/2023)   Received from [facility] - Occupational Stress Questionnaire    Feeling of Stress : Not at all  Social Connections: Socially Integrated (09/12/2023)   Received from [facility]   Social Network    How would you rate your social network (family, work, friends)?: Good participation with social networks    MEDICATIONS:  Current Outpatient Medications  Medication Sig Dispense Refill   amLODipine  (NORVASC ) 10 MG tablet Take 1 tablet (10 mg total) by mouth daily. (Patient taking differently: Take 10 mg by mouth daily. Take 1/2 tablet daily) 90 tablet 2   atorvastatin  (LIPITOR) 10 MG tablet TAKE 1 TABLET(10 MG) BY MOUTH DAILY 90 tablet 0   Blood Glucose Monitoring Suppl (ACCU-CHEK GUIDE) w/Device KIT Use to check blood sugars daily 1 kit 0   glucose blood (ACCU-CHEK GUIDE) test strip Use to check blood sugars daily 100 each 2   hydrALAZINE (APRESOLINE) 25 MG tablet TAKE ONE TABLET BY MOUTH TWICE A DAY FOR BLOOD PRESSURE     Leuprolide  Acetate (LUPRON  DEPOT, 65-MONTH, IM) Inject into the muscle.     losartan  (COZAAR ) 25 MG tablet Take 1 tablet (25 mg total) by mouth daily. 90 tablet 3   Vibegron (GEMTESA PO) Take by mouth.     vitamin B-12 (CYANOCOBALAMIN ) 500 MCG tablet Take 2 tablets by mouth daily.      empagliflozin  (JARDIANCE ) 25 MG TABS tablet Take 1/2 tab daily. 45 tablet 4   metFORMIN  (GLUCOPHAGE -XR) 500 MG 24 hr tablet Take 1 tablet (500 mg total) by mouth 2 (two) times daily with a meal. Take 1 tab in the morning and 1 tab in the evening. 180 tablet 3   pioglitazone  (ACTOS ) 30 MG tablet TAKE 1 TABLET(30 MG) BY MOUTH DAILY 90 tablet 3   trimethoprim -polymyxin b  (POLYTRIM ) ophthalmic solution Apply 1-2 drops into affected eye QID x 5 days. (Patient not taking: Reported on 11/02/2023) 10 mL 0   No current facility-administered medications  for this visit.    PHYSICAL EXAM: Vitals:   07/12/24 0924  BP: (!) 108/50  Pulse: 74  Resp: 20  SpO2: 96%  Weight: 195 lb 3.2 oz (88.5 kg)  Height: 5' 10 (1.778 m)     Body mass index is 28.01 kg/m.  Wt Readings from Last 3 Encounters:  07/12/24 195 lb 3.2 oz (88.5 kg)  04/12/24 190 lb (86.2 kg)  03/07/24 195 lb 9.6 oz (88.7 kg)    General: Well developed, well nourished male in no apparent distress.  HEENT: AT/[place], no external lesions.  Eyes: Conjunctiva clear and no icterus. Neck: Neck supple  Lungs: Respirations not labored Neurologic: Alert, oriented, normal speech Extremities / Skin: Dry.  Psychiatric: Does not appear depressed or anxious  Diabetic Foot Exam - Simple   No data filed    LABS Reviewed Lab Results  Component Value Date   HGBA1C 6.0 (A) 01/30/2024   HGBA1C 6.4 (H) 09/22/2023   HGBA1C 6.4 05/16/2023   Lab Results  Component Value Date   FRUCTOSAMINE 228 01/30/2020   Lab Results  Component Value Date   CHOL 187 09/22/2023   HDL 78 09/22/2023   LDLCALC 89 09/22/2023   LDLDIRECT 84.0 03/30/2021   TRIG 105 09/22/2023   CHOLHDL 2.4 09/22/2023   Lab Results  Component Value Date   MICRALBCREAT 26.8 09/15/2023   Lab Results  Component Value Date   CREATININE 1.55 (H) 07/12/2024   Lab Results  Component Value Date   GFR 48.60 (L) 06/01/2023    ASSESSMENT / PLAN  1. Controlled type 2 diabetes  mellitus with complication, without long-term current use of insulin (HCC)     Diabetes Mellitus type 2, complicated by CKD/microalbuminuria. - Diabetic status / severity: Controlled  Lab Results  Component Value Date   HGBA1C 6.0 (A) 01/30/2024    - Hemoglobin A1c goal : <6.5%  - Medications: See below.  I) decrease metformin  500 mg extended release 1 tablet in the morning and from 2 tablets to 1 tablet in the evening. II) continue Jardiance  12.5 mg daily. III) continue pioglitazone /Actos  30 mg daily.  - Home glucose testing: In the morning fasting daily and as needed. - Discussed/ Gave Hypoglycemia treatment plan.  # Consult : not required at this time.   # Annual urine for microalbuminuria/ creatinine ratio, no microalbuminuria currently, currently , on ACE/ARB /losartan . Urine microalbumin creatinine ratio done at [facility] was normal on September 15, 2023. Consider referral to nephrology if renal function declines in the future visits.  Last  Lab Results  Component Value Date   MICRALBCREAT 26.8 09/15/2023    # Foot check nightly / neuropathy.  # Annual dilated diabetic eye exams.   - Diet: Make healthy diabetic food choices - Life style / activity / exercise: Discussed.  2. Blood pressure  -  BP Readings from Last 1 Encounters:  07/12/24 (!) 108/50    - Control is in target.  - No change in current plans.  3. Lipid status / Hyperlipidemia - Last  Lab Results  Component Value Date   LDLCALC 89 09/22/2023   - Continue atorvastatin  10 mg daily.  Diagnoses and all orders for this visit:  Controlled type 2 diabetes mellitus with complication, without long-term current use of insulin (HCC) -     empagliflozin  (JARDIANCE ) 25 MG TABS tablet; Take 1/2 tab daily. -     metFORMIN  (GLUCOPHAGE -XR) 500 MG 24 hr tablet; Take 1 tablet (500 mg total) by mouth 2 (two)  times daily with a meal. Take 1 tab in the morning and 1 tab in the evening. -     pioglitazone  (ACTOS ) 30 MG  tablet; TAKE 1 TABLET(30 MG) BY MOUTH DAILY   DISPOSITION Follow up in clinic in 4 months suggested.    All questions answered and patient verbalized understanding of the plan.  Avondre Richens, MD [facility] [address] [place], [address] [place], [place] [address] Phone # [phone]  At least part of this note was generated using voice recognition software. Inadvertent word errors may have occurred, which were not recognized during the proofreading process.

## 2024-07-13 LAB — TESTOSTERONE: Testosterone: 6 ng/dL — ABNORMAL LOW (ref 264–916)

## 2024-07-19 ENCOUNTER — Inpatient Hospital Stay

## 2024-07-19 ENCOUNTER — Inpatient Hospital Stay (HOSPITAL_BASED_OUTPATIENT_CLINIC_OR_DEPARTMENT_OTHER): Admitting: Hematology and Oncology

## 2024-07-19 ENCOUNTER — Other Ambulatory Visit: Payer: Self-pay | Admitting: Hematology and Oncology

## 2024-07-19 VITALS — BP 134/66 | HR 72 | Temp 97.2°F | Resp 14 | Wt 193.4 lb

## 2024-07-19 DIAGNOSIS — Z8546 Personal history of malignant neoplasm of prostate: Secondary | ICD-10-CM | POA: Diagnosis not present

## 2024-07-19 DIAGNOSIS — Z5111 Encounter for antineoplastic chemotherapy: Secondary | ICD-10-CM | POA: Diagnosis not present

## 2024-07-19 DIAGNOSIS — Z9079 Acquired absence of other genital organ(s): Secondary | ICD-10-CM | POA: Diagnosis not present

## 2024-07-19 DIAGNOSIS — C61 Malignant neoplasm of prostate: Secondary | ICD-10-CM | POA: Diagnosis not present

## 2024-07-19 DIAGNOSIS — M8580 Other specified disorders of bone density and structure, unspecified site: Secondary | ICD-10-CM | POA: Diagnosis not present

## 2024-07-19 DIAGNOSIS — Z79899 Other long term (current) drug therapy: Secondary | ICD-10-CM | POA: Diagnosis not present

## 2024-07-19 MED ORDER — LEUPROLIDE ACETATE (4 MONTH) 30 MG ~~LOC~~ KIT
30.0000 mg | PACK | Freq: Once | SUBCUTANEOUS | Status: AC
  Administered 2024-07-19: 30 mg via SUBCUTANEOUS
  Filled 2024-07-19: qty 30

## 2024-07-19 NOTE — Progress Notes (Signed)
 [facility] [phone]   [phone]  PROGRESS NOTE  Patient Care Team: Delana Corean GRADE, MD as PCP - General (Internal Medicine)  Hematological/Oncological History # Advanced Castrate Sensitive Prostate Cancer  11/25/2004 : diagnosed with Prostate cancer, underwent retropubic prostatectomy and bilateral lymph node dissection. The pathology showed a Gleason score of 4+4 equals 8 with staging of T2b N0.  3-12/2005: salvage radiation therapy for a rise in his PSA to 0.29. He received total of 65 gray in 35 fractions.  2016: castration-sensitive with biochemical relapse. Gleason score 4+3 equal 7 and a PSA of 3.66 at the time  06/17/2022: last visit with Dr. Amadeo.  10/28/2022: transfer care to Dr. Federico   Interval History:  Richard [name] 78 y.o. male with medical history significant for castrate sensitive prostate cancer who presents for a follow up visit. The patient's last visit was on 03/07/2024. In the interim since the last visit he has had no major changes in his health.  On exam today [name] reports patient reports that he has been well overall in the interim since our last visit.  He reports that he has been having a memory issue but they have not yet been able to get to the bottom of it.  He is getting most of his care through the [facility] and recently saw [facility] oncologist who offered the option of intermittent ADT.  After discussion with the patient today he noted he would like to continue with continuous ADT therapy with us .  He was also found to have some issues with osteopenia for which he was started on calcium  and vitamin D.  Overall he is tolerating Eligard  therapy well with no sweats, chills, or hot flashes.  He is had no recent issues with nausea, vomiting, or diarrhea.  He denies any bone or back pain.  Overall he feels well and is willing and able to continue on ADT at this time.  MEDICAL HISTORY:  Past Medical History:  Diagnosis Date    Diabetes mellitus without complication (HCC)    Hypertension    prostate ca dx'd 2006   prostatectomy and xrt    SURGICAL HISTORY: No past surgical history on file.  SOCIAL HISTORY: Social History   Socioeconomic History   Marital status: Married    Spouse name: Not on file   Number of children: Not on file   Years of education: Not on file   Highest education level: Not on file  Occupational History   Not on file  Tobacco Use   Smoking status: Every Day   Smokeless tobacco: Never  Vaping Use   Vaping status: Never Used  Substance and Sexual Activity   Alcohol use: Yes    Comment: occ   Drug use: Never   Sexual activity: Not on file  Other Topics Concern   Not on file  Social History Narrative   Not on file   Social Drivers of Health   Financial Resource Strain: Low Risk  (09/12/2023)   Received from [facility]   Overall Financial Resource Strain (CARDIA)    Difficulty of Paying Living Expenses: Not hard at all  Food Insecurity: No Food Insecurity (09/12/2023)   Received from [facility]   Hunger Vital Sign    Within the past 12 months, you worried that your food would run out before you got the money to buy more.: Never true    Within the past 12 months, the food you bought just didn't last  and you didn't have money to get more.: Never true  Transportation Needs: No Transportation Needs (09/12/2023)   Received from [facility]   PRAPARE - Transportation    Lack of Transportation (Medical): No    Lack of Transportation (Non-Medical): No  Physical Activity: Not on file  Stress: No Stress Concern Present (09/12/2023)   Received from [facility] - Occupational Stress Questionnaire    Feeling of Stress : Not at all  Social Connections: Socially Integrated (09/12/2023)   Received from [facility]   Social Network    How would you rate your social network (family, work, friends)?: Good participation with social  networks  Intimate Partner Violence: Not At Risk (09/12/2023)   Received from [facility]   HITS    Over the last 12 months how often did your partner physically hurt you?: Never    Over the last 12 months how often did your partner insult you or talk down to you?: Never    Over the last 12 months how often did your partner threaten you with physical harm?: Never    Over the last 12 months how often did your partner scream or curse at you?: Never    FAMILY HISTORY: Family History  Problem Relation Age of Onset   Hypertension Father    Heart disease Neg Hx    Diabetes Neg Hx     ALLERGIES:  is allergic to effexor  xr [venlafaxine  hcl].  MEDICATIONS:  Current Outpatient Medications  Medication Sig Dispense Refill   amLODipine  (NORVASC ) 10 MG tablet Take 1 tablet (10 mg total) by mouth daily. (Patient taking differently: Take 10 mg by mouth daily. Take 1/2 tablet daily) 90 tablet 2   atorvastatin  (LIPITOR) 10 MG tablet TAKE 1 TABLET(10 MG) BY MOUTH DAILY 90 tablet 0   Blood Glucose Monitoring Suppl (ACCU-CHEK GUIDE) w/Device KIT Use to check blood sugars daily 1 kit 0   empagliflozin  (JARDIANCE ) 25 MG TABS tablet Take 1/2 tab daily. 45 tablet 4   glucose blood (ACCU-CHEK GUIDE) test strip Use to check blood sugars daily 100 each 2   hydrALAZINE (APRESOLINE) 25 MG tablet TAKE ONE TABLET BY MOUTH TWICE A DAY FOR BLOOD PRESSURE     Leuprolide  Acetate (LUPRON  DEPOT, 41-MONTH, IM) Inject into the muscle.     losartan  (COZAAR ) 25 MG tablet Take 1 tablet (25 mg total) by mouth daily. 90 tablet 3   metFORMIN  (GLUCOPHAGE -XR) 500 MG 24 hr tablet Take 1 tablet (500 mg total) by mouth 2 (two) times daily with a meal. Take 1 tab in the morning and 1 tab in the evening. 180 tablet 3   pioglitazone  (ACTOS ) 30 MG tablet TAKE 1 TABLET(30 MG) BY MOUTH DAILY 90 tablet 3   trimethoprim -polymyxin b  (POLYTRIM ) ophthalmic solution Apply 1-2 drops into affected eye QID x 5 days. (Patient not taking: Reported on  11/02/2023) 10 mL 0   Vibegron (GEMTESA PO) Take by mouth.     vitamin B-12 (CYANOCOBALAMIN ) 500 MCG tablet Take 2 tablets by mouth daily.     No current facility-administered medications for this visit.    REVIEW OF SYSTEMS:   Constitutional: ( - ) fevers, ( - )  chills , ( - ) night sweats Eyes: ( - ) blurriness of vision, ( - ) double vision, ( - ) watery eyes Ears, nose, mouth, throat, and face: ( - ) mucositis, ( - ) sore throat Respiratory: ( - ) cough, ( - )  dyspnea, ( - ) wheezes Cardiovascular: ( - ) palpitation, ( - ) chest discomfort, ( - ) lower extremity swelling Gastrointestinal:  ( - ) nausea, ( - ) heartburn, ( - ) change in bowel habits Skin: ( - ) abnormal skin rashes Lymphatics: ( - ) new lymphadenopathy, ( - ) easy bruising Neurological: ( - ) numbness, ( - ) tingling, ( - ) new weaknesses Behavioral/Psych: ( - ) mood change, ( - ) new changes  All other systems were reviewed with the patient and are negative.  PHYSICAL EXAMINATION: ECOG PERFORMANCE STATUS: 1 - Symptomatic but completely ambulatory  There were no vitals filed for this visit.    There were no vitals filed for this visit.  GENERAL: Well-appearing elderly Caucasian male, alert, no distress and comfortable SKIN: skin color, texture, turgor are normal, no rashes or significant lesions EYES: conjunctiva are pink and non-injected, sclera clear LUNGS: clear to auscultation and percussion with normal breathing effort HEART: regular rate & rhythm and no murmurs and no lower extremity edema Musculoskeletal: no cyanosis of digits and no clubbing  PSYCH: alert & oriented x 3, fluent speech NEURO: no focal motor/sensory deficits  LABORATORY DATA:  I have reviewed the data as listed    Latest Ref Rng & Units 07/12/2024    8:19 AM 02/28/2024    7:30 AM 10/26/2023    8:34 AM  CBC  WBC 4.0 - 10.5 K/uL 6.8  5.6  5.0   Hemoglobin 13.0 - 17.0 g/dL 86.5  87.0  87.3   Hematocrit 39.0 - 52.0 % 40.0  39.2  40.0    Platelets 150 - 400 K/uL 223  201  212        Latest Ref Rng & Units 07/12/2024    8:19 AM 02/28/2024    7:30 AM 10/26/2023    8:34 AM  CMP  Glucose 70 - 99 mg/dL 880  895  882   BUN 8 - 23 mg/dL 31  19  27    Creatinine 0.61 - 1.24 mg/dL 8.44  8.61  8.48   Sodium 135 - 145 mmol/L 138  139  141   Potassium 3.5 - 5.1 mmol/L 4.7  4.7  4.6   Chloride 98 - 111 mmol/L 104  103  107   CO2 22 - 32 mmol/L 27  30  29    Calcium  8.9 - 10.3 mg/dL 9.3  9.3  9.3   Total Protein 6.5 - 8.1 g/dL 6.4  6.5  6.3   Total Bilirubin 0.0 - 1.2 mg/dL 0.4  0.7  0.5   Alkaline Phos 38 - 126 U/L 57  66  59   AST 15 - 41 U/L 16  16  14    ALT 0 - 44 U/L 11  10  10     RADIOGRAPHIC STUDIES: No results found.  ASSESSMENT & PLAN HANSFORD HIRT 78 y.o. male with medical history significant for castrate sensitive prostate cancer who presents for a follow up visit.   # Advanced Castrate Sensitive Prostate Cancer  -- continue eligard  30 mg q 4 months Subq -- labs show white blood cell 6.8, Hgb 13.4, MCV 97.6, Plt 223. LFTs in range. PSA was <0.02, testosterone  6.  Creatinine 1.15 --No clear indication for imaging at this time. -- RTC in 4 months for repeat clinic visit and continued eligard .   No orders of the defined types were placed in this encounter.   All questions were answered. The patient knows to call the clinic  with any problems, questions or concerns.   I have spent a total of 30 minutes minutes of face-to-face and non-face-to-face time, preparing to see the patient, performing a medically appropriate examination, counseling and educating the patient, documenting clinical information in the electronic health record, independently interpreting results and communicating results to the patient, and care coordination.   Richard IVAR Kidney, MD Department of Hematology/Oncology [facility] Phone: [phone] Pager: [phone] Email:  [email or web address]    07/19/2024 7:26 AM

## 2024-07-21 ENCOUNTER — Encounter: Payer: Self-pay | Admitting: Hematology and Oncology

## 2024-07-23 ENCOUNTER — Ambulatory Visit: Payer: Self-pay

## 2024-07-23 NOTE — Telephone Encounter (Signed)
 TC to pt's wife, Malak Orantes, regarding pt's recent lab results. Left message to return call.   Lab results show  that he is having excellent response to his Lupron  therapy.  His PSA is undetectable and his testosterone  remains at target.  Will plan to see him back as  scheduled in 4 months time

## 2024-07-23 NOTE — Telephone Encounter (Signed)
-----   Message from Nurse Almarie T sent at 07/23/2024  9:58 AM EST -----  ----- Message ----- From: Federico Norleen ONEIDA MADISON, MD Sent: 07/21/2024   4:59 PM EST To: Almarie DELENA Arabia, RN  Please let Mr. Madelon know via his wife that he is having excellent response to his Lupron  therapy.  His PSA is undetectable and his testosterone  remains at target.  Will plan to see him back as  scheduled in 4 months time ----- Message ----- From: Interface, Lab In [place] Sent: 07/12/2024   2:22 PM EST To: Norleen ONEIDA Federico MADISON, MD

## 2024-07-23 NOTE — Telephone Encounter (Signed)
 Pt's wife, Andres, returned call.  Recent lab results reviewed and reminded of next appts. Vicky verbalized an understanding of this information.

## 2024-07-24 DIAGNOSIS — N3946 Mixed incontinence: Secondary | ICD-10-CM | POA: Diagnosis not present

## 2024-07-24 DIAGNOSIS — N3281 Overactive bladder: Secondary | ICD-10-CM | POA: Diagnosis not present

## 2024-08-02 ENCOUNTER — Encounter: Admitting: Psychology

## 2024-09-20 ENCOUNTER — Encounter: Attending: Psychology | Admitting: Psychology

## 2024-09-20 ENCOUNTER — Encounter: Payer: Self-pay | Admitting: Psychology

## 2024-09-20 DIAGNOSIS — F4389 Other reactions to severe stress: Secondary | ICD-10-CM | POA: Diagnosis not present

## 2024-09-20 DIAGNOSIS — R4189 Other symptoms and signs involving cognitive functions and awareness: Secondary | ICD-10-CM | POA: Insufficient documentation
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
## 2024-09-27 ENCOUNTER — Encounter

## 2024-09-27 DIAGNOSIS — R4189 Other symptoms and signs involving cognitive functions and awareness: Secondary | ICD-10-CM

## 2024-09-27 DIAGNOSIS — F4389 Other reactions to severe stress: Secondary | ICD-10-CM

## 2024-09-27 NOTE — Progress Notes (Unsigned)
 "  Mental Status/Behavioral Observations (09/27/2024):  Orientation: The patient was oriented to self and place. He was oriented to time with the exception of the day of the week (off by 3 days) and the month (off by 1 day).  Sensory/Arousal: Hearing was adequate for the demands of testing when speech was of increased loudness. Vision was adequate with the assistance of corrective lenses. The patient was alert. Appearance: Dress and hygiene were appropriate for the setting.  Speech/Language: In conversation, the patient's speech was prosodic, fluent, and well-articulated. The patient displayed no indications of word finding difficulties and no word substitution errors were observed.  Motor: The patient ambulated independently and without issue. No tremors were observed.  Social Comportment: Social behavior was appropriate for the setting. Mood/Affect: Mood was largely neutral to positive. Affect was congruent with mood.  Attention/Concentration: The patient appeared to maintain consistent engagement throughout the testing session. No frank attentional lapses were observed.  Thought Process/Content: The patient's thought process was coherent, linear, goal directed. There were no indications of psychosis.  Additional Observations: The patient showed no difficulties with understanding task instructions. No difficulties with frustration tolerance were noted.  Neuropsychology Note Richard [name] completed 105 minutes of neuropsychological testing with technician, Richard [name], BA, under the supervision of Evalene Riff, PsyD., Clinical Neuropsychologist. The patient did not appear overtly distressed by the testing session, per behavioral observation or via self-report to the technician. Rest breaks were offered.   Clinical Decision Making: In considering the patient's current level of functioning, level of presumed impairment, nature of symptoms, emotional and behavioral responses during clinical  interview, level of literacy, and observed level of motivation/effort, a battery of tests was selected by Dr. Riff during initial consultation on 09/20/2024. This was communicated to the technician. Communication between the neuropsychologist and technician was ongoing throughout the testing session and changes were made as deemed necessary based on patient performance on testing, technician observations and additional pertinent factors such as those listed above.  Tests Administered: Automatic Data Edition (BNT-2) Brief Visuospatial Memory Test-Revised (BVMT-R) Clock Drawing Test Controlled Oral Word Association Test (FAS & Animals) Delis-Kaplan Executive Function System (D-KEFS), select subtests Hopkins Verbal Learning Test - Revised (HVLT-R) Repeatable Battery for the Assessment of Neuropsychological Status Update (RBANS) Trail Making Test (TMT; Part A & B) Wechsler Adult Intelligence Scale-Fourth Edition (WAIS-IV), select subtests Wechsler Abbreviated Scale of Intelligence CIVIL SERVICE FAST STREAMER) Wechsler Memory Scale-Fourth Edition (WMS-IV) , select subtests Wechsler Memory Scale-Third Edition (WMS-III), select subtests  Wechsler Test of Adult Reading (WTAR) Geriatric Depression Scale-Short Form (GDS-SF) Geriatric Anxiety Inventory (GAI)  NEUROPSYCHOLOGICAL TEST RESULTS: Note: This summary of test scores accompanies the interpretive report and should not be interpreted by unqualified individuals or in isolation without reference to the report.   Test scores are relative to age and further adjusted for educational history and demographics as available when appropriate.  Measurement properties of test scores: IQ, Index, and Standard Scores (SS): Mean = 100; Standard Deviation = 15; Scaled Scores (ss): Mean = 10; Standard Deviation = 3; Z scores (Z): Mean = 0; Standard Deviation = 1; T scores (T); Mean = 50; Standard Deviation = 10  Intellectual/Premorbid Functioning Estimate   Norm Score  Percentile  Range  Wechsler Test of Adult Reading  SS = 119 90 %ile High Average            WASI-II FSIQ-2  SS = 100 50 %ile Average   Vocabulary   T = 54 66 %ile Average   Matrix  Reasoning   T = 46 32 %ile Average   ATTENTION AND WORKING MEMORY    Norm Score Percentile  Range  WAIS-IV          Digit Span  ss = 9 37 %ile Average   DSF  ss = 8 25 %ile Average   Span:    5      DSB  ss = 9 37 %ile Average   Span:    4      DSS  ss = 10 50 %ile Average   Span:    5     WMS-III          Spatial Span  ss = 8 25 %ile Average   SSF  ss = 10 50 %ile Average   Span:    5      SSB  ss = 7 16 %ile Low Average   Span:    5      PROCESSING SPEED    Norm Score Percentile  Range  WAIS-IV          Coding  ss = 11 63 %ile Average   Symbol Search  ss = 8 25 %ile Average   LANGUAGE    Norm Score Percentile  Range  Boston Naming Test (BNT-2)  T = 48 42 %ile Average  COWAT          FAS  T = 58 79 %ile High Average   Animals  T = 55 68 %ile Average   EXECUTIVE FUNCTIONING    Norm Score Percentile  Range  DKEFS - Color-Word Interference          Color Naming  ss = 11 63 %ile Average   Word Reading  ss = 12 75 %ile High Average   Inhibition  ss = 9 37 %ile Average   Errors 1 ss = 12 75 %ile High Average   Inhibition Switching  ss = 9 37 %ile Average   Errors 6 ss = 7 16 %ile Low Average  TMT A  T = 41 18 %ile Low Average  TMT B  T = 37 9 %ile Low Average   MEMORY    Norm Score Percentile  Range  BVMT-R          Trial 1 3 T = 42.0 21 %ile Low Average   Trial 2 3 T = 31 3 %ile Below Average   Trial 3 6 T = 40.0 16 %ile Low Average   Total Recall  T = 36.0 8 %ile Below Average   Learning  T = 46.0 32 %ile Average   Delayed Recall 3 T = 31.0 3 %ile Below Average   % Retained    50 3-5 %ile Exceptionally Low   Hits 3.0    3-5 %ile Below Average   False Alarms 1    11-16 %ile Low Average   Recognition Discriminability 2.0    3-5 %ile Below Average  HVLT          Trial 1 4         Trial 2  7         Trial 3 7         Total Recall  T = 40.0 16 %ile Low Average   Delayed Recall 3 T = 31 3 %ile Below Average   %Retention  T = 29.0 2 %ile Exceptionally Low   Rec - True Positives 10  Rec - False Positives 1         Recognition Discriminability 9 T = 43.0 25 %ile Average  Wechsler Memory Scale, 4th Edition (WMS-4)         Log. Mem. Immediate Recall  ss = 9 37 %ile Average   Logical Memory Delayed Recall  ss = 3 1 %ile Exceptionally Low   Logical Recognition    >75th   %ile High Average    VISUAL-SPATIAL    Norm Score Percentile  Range  Clock                   RBANS Visuospatial Index          RBANS Figure Copy  ss = 7 16 %ile Low Average   RBANS Line Orientation      10th-16th  %ile Low Average   PERSONALITY AND BEHAVIORAL FUNCTIONING      Score/Interpretation  GDS-SF Raw       3  GDS-SF Severity       Minimal.  GAI Raw       5  GAI Severity       Minimal.    Feedback to Patient: Richard [name] will return on 10/05/2024 for an interactive feedback session with Dr. Hayden at which time his test performances, clinical impressions and treatment recommendations will be reviewed in detail. The patient understands he can contact our office should he require our assistance before this time.  105 minutes spent face-to-face with patient administering standardized tests, 45 minutes spent scoring radiographer, therapeutic). [CPT [id number], [id number]]  Full report to follow. "

## 2024-10-01 ENCOUNTER — Encounter: Admitting: Psychology

## 2024-10-05 ENCOUNTER — Encounter: Admitting: Psychology

## 2024-11-09 ENCOUNTER — Inpatient Hospital Stay

## 2024-11-12 ENCOUNTER — Ambulatory Visit: Admitting: Endocrinology

## 2024-11-16 ENCOUNTER — Inpatient Hospital Stay: Admitting: Hematology and Oncology

## 2024-11-16 ENCOUNTER — Inpatient Hospital Stay

## 2025-03-11 ENCOUNTER — Inpatient Hospital Stay

## 2025-03-18 ENCOUNTER — Inpatient Hospital Stay

## 2025-03-18 ENCOUNTER — Inpatient Hospital Stay: Admitting: Hematology and Oncology
